# Patient Record
Sex: Male | Born: 1950 | Race: White | Hispanic: No | Marital: Married | Smoking: Former smoker
Health system: Southern US, Community
[De-identification: ages and names within clinical notes are randomized; demographics above are authoritative.]

## PROBLEM LIST (undated history)

## (undated) DIAGNOSIS — C61 Malignant neoplasm of prostate: Secondary | ICD-10-CM

## (undated) DIAGNOSIS — M1990 Unspecified osteoarthritis, unspecified site: Secondary | ICD-10-CM

## (undated) DIAGNOSIS — K5730 Diverticulosis of large intestine without perforation or abscess without bleeding: Secondary | ICD-10-CM

## (undated) DIAGNOSIS — F03918 Unspecified dementia, unspecified severity, with other behavioral disturbance: Secondary | ICD-10-CM

## (undated) DIAGNOSIS — K5790 Diverticulosis of intestine, part unspecified, without perforation or abscess without bleeding: Secondary | ICD-10-CM

## (undated) DIAGNOSIS — M87052 Idiopathic aseptic necrosis of left femur: Secondary | ICD-10-CM

## (undated) DIAGNOSIS — F0391 Unspecified dementia with behavioral disturbance: Secondary | ICD-10-CM

## (undated) DIAGNOSIS — E785 Hyperlipidemia, unspecified: Secondary | ICD-10-CM

## (undated) DIAGNOSIS — Z8739 Personal history of other diseases of the musculoskeletal system and connective tissue: Secondary | ICD-10-CM

## (undated) DIAGNOSIS — Z973 Presence of spectacles and contact lenses: Secondary | ICD-10-CM

## (undated) DIAGNOSIS — Z7901 Long term (current) use of anticoagulants: Secondary | ICD-10-CM

## (undated) DIAGNOSIS — F0390 Unspecified dementia without behavioral disturbance: Secondary | ICD-10-CM

## (undated) DIAGNOSIS — K222 Esophageal obstruction: Secondary | ICD-10-CM

## (undated) DIAGNOSIS — G8929 Other chronic pain: Secondary | ICD-10-CM

## (undated) DIAGNOSIS — IMO0002 Reserved for concepts with insufficient information to code with codable children: Secondary | ICD-10-CM

## (undated) DIAGNOSIS — I2510 Atherosclerotic heart disease of native coronary artery without angina pectoris: Secondary | ICD-10-CM

## (undated) DIAGNOSIS — G894 Chronic pain syndrome: Secondary | ICD-10-CM

## (undated) DIAGNOSIS — Z8619 Personal history of other infectious and parasitic diseases: Secondary | ICD-10-CM

## (undated) DIAGNOSIS — I10 Essential (primary) hypertension: Secondary | ICD-10-CM

## (undated) DIAGNOSIS — Z86718 Personal history of other venous thrombosis and embolism: Secondary | ICD-10-CM

## (undated) DIAGNOSIS — F1011 Alcohol abuse, in remission: Secondary | ICD-10-CM

## (undated) DIAGNOSIS — G319 Degenerative disease of nervous system, unspecified: Secondary | ICD-10-CM

## (undated) DIAGNOSIS — Z951 Presence of aortocoronary bypass graft: Secondary | ICD-10-CM

## (undated) DIAGNOSIS — R413 Other amnesia: Secondary | ICD-10-CM

## (undated) HISTORY — DX: Unspecified dementia without behavioral disturbance: F03.90

## (undated) HISTORY — DX: Idiopathic aseptic necrosis of left femur: M87.052

## (undated) HISTORY — DX: Reserved for concepts with insufficient information to code with codable children: IMO0002

---
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
## 1997-01-27 DIAGNOSIS — Z951 Presence of aortocoronary bypass graft: Secondary | ICD-10-CM

## 1997-01-27 HISTORY — DX: Presence of aortocoronary bypass graft: Z95.1

## 1997-06-29 HISTORY — PX: CORONARY ARTERY BYPASS GRAFT: SHX141

## 2008-04-03 ENCOUNTER — Ambulatory Visit (HOSPITAL_COMMUNITY): Admission: RE | Admit: 2008-04-03 | Discharge: 2008-04-03 | Payer: Self-pay | Admitting: Internal Medicine

## 2010-05-31 ENCOUNTER — Other Ambulatory Visit (HOSPITAL_COMMUNITY): Payer: Self-pay | Admitting: Internal Medicine

## 2010-05-31 DIAGNOSIS — R4182 Altered mental status, unspecified: Secondary | ICD-10-CM

## 2010-06-03 ENCOUNTER — Ambulatory Visit (HOSPITAL_COMMUNITY)
Admission: RE | Admit: 2010-06-03 | Discharge: 2010-06-03 | Disposition: A | Discharge status: Home / Self Care | Payer: Self-pay | Source: Ambulatory Visit | Attending: Internal Medicine | Admitting: Internal Medicine

## 2010-06-03 DIAGNOSIS — R4182 Altered mental status, unspecified: Secondary | ICD-10-CM

## 2011-06-09 DIAGNOSIS — F528 Other sexual dysfunction not due to a substance or known physiological condition: Secondary | ICD-10-CM | POA: Diagnosis not present

## 2011-06-09 DIAGNOSIS — Z6831 Body mass index (BMI) 31.0-31.9, adult: Secondary | ICD-10-CM | POA: Diagnosis not present

## 2011-06-09 DIAGNOSIS — F329 Major depressive disorder, single episode, unspecified: Secondary | ICD-10-CM | POA: Diagnosis not present

## 2011-06-09 DIAGNOSIS — G8929 Other chronic pain: Secondary | ICD-10-CM | POA: Diagnosis not present

## 2011-06-09 DIAGNOSIS — M159 Polyosteoarthritis, unspecified: Secondary | ICD-10-CM | POA: Diagnosis not present

## 2011-09-30 DIAGNOSIS — Z0000 Encounter for general adult medical examination without abnormal findings: Secondary | ICD-10-CM | POA: Diagnosis not present

## 2011-09-30 DIAGNOSIS — M159 Polyosteoarthritis, unspecified: Secondary | ICD-10-CM | POA: Diagnosis not present

## 2011-09-30 DIAGNOSIS — G8929 Other chronic pain: Secondary | ICD-10-CM | POA: Diagnosis not present

## 2011-11-03 ENCOUNTER — Telehealth: Payer: Self-pay

## 2011-11-03 NOTE — Telephone Encounter (Signed)
LMOM to call.

## 2011-11-04 ENCOUNTER — Telehealth: Payer: Self-pay | Admitting: *Deleted

## 2011-11-04 NOTE — Telephone Encounter (Signed)
Mr Kemler called back to schedule his colonoscopy with you. He is concerned about his insurance covering the bill as well. Please call him back. Thanks.

## 2011-11-04 NOTE — Telephone Encounter (Signed)
Pt's wife said that the insurance will have better coverage if they do the procedure in doctor's office as opposed to hospital facility. I told her to let Dr. Sherwood Gambler know, so he could send a different referral.

## 2011-11-04 NOTE — Telephone Encounter (Signed)
Faxed a note to Dr. Sherwood Gambler in reference to the referral.

## 2011-12-01 DIAGNOSIS — Z79899 Other long term (current) drug therapy: Secondary | ICD-10-CM | POA: Diagnosis not present

## 2011-12-01 DIAGNOSIS — R7309 Other abnormal glucose: Secondary | ICD-10-CM | POA: Diagnosis not present

## 2011-12-01 DIAGNOSIS — Z125 Encounter for screening for malignant neoplasm of prostate: Secondary | ICD-10-CM | POA: Diagnosis not present

## 2012-01-30 DIAGNOSIS — Z6830 Body mass index (BMI) 30.0-30.9, adult: Secondary | ICD-10-CM | POA: Diagnosis not present

## 2012-01-30 DIAGNOSIS — M159 Polyosteoarthritis, unspecified: Secondary | ICD-10-CM | POA: Diagnosis not present

## 2012-01-30 DIAGNOSIS — G8929 Other chronic pain: Secondary | ICD-10-CM | POA: Diagnosis not present

## 2012-01-30 DIAGNOSIS — G4700 Insomnia, unspecified: Secondary | ICD-10-CM | POA: Diagnosis not present

## 2012-01-30 DIAGNOSIS — F411 Generalized anxiety disorder: Secondary | ICD-10-CM | POA: Diagnosis not present

## 2012-04-29 DIAGNOSIS — G4700 Insomnia, unspecified: Secondary | ICD-10-CM | POA: Diagnosis not present

## 2012-04-29 DIAGNOSIS — I10 Essential (primary) hypertension: Secondary | ICD-10-CM | POA: Diagnosis not present

## 2012-04-29 DIAGNOSIS — G8929 Other chronic pain: Secondary | ICD-10-CM | POA: Diagnosis not present

## 2012-04-29 DIAGNOSIS — Z6830 Body mass index (BMI) 30.0-30.9, adult: Secondary | ICD-10-CM | POA: Diagnosis not present

## 2012-08-09 DIAGNOSIS — G8929 Other chronic pain: Secondary | ICD-10-CM | POA: Diagnosis not present

## 2012-08-09 DIAGNOSIS — I10 Essential (primary) hypertension: Secondary | ICD-10-CM | POA: Diagnosis not present

## 2012-08-09 DIAGNOSIS — Z6827 Body mass index (BMI) 27.0-27.9, adult: Secondary | ICD-10-CM | POA: Diagnosis not present

## 2012-08-09 DIAGNOSIS — M159 Polyosteoarthritis, unspecified: Secondary | ICD-10-CM | POA: Diagnosis not present

## 2012-08-11 ENCOUNTER — Encounter: Payer: Self-pay | Admitting: Internal Medicine

## 2012-10-29 DIAGNOSIS — Z79899 Other long term (current) drug therapy: Secondary | ICD-10-CM | POA: Diagnosis not present

## 2012-10-29 DIAGNOSIS — Z125 Encounter for screening for malignant neoplasm of prostate: Secondary | ICD-10-CM | POA: Diagnosis not present

## 2012-10-29 DIAGNOSIS — Z0000 Encounter for general adult medical examination without abnormal findings: Secondary | ICD-10-CM | POA: Diagnosis not present

## 2012-11-01 DIAGNOSIS — Z23 Encounter for immunization: Secondary | ICD-10-CM | POA: Diagnosis not present

## 2012-11-01 DIAGNOSIS — G8929 Other chronic pain: Secondary | ICD-10-CM | POA: Diagnosis not present

## 2012-11-01 DIAGNOSIS — Z681 Body mass index (BMI) 19 or less, adult: Secondary | ICD-10-CM | POA: Diagnosis not present

## 2013-01-28 DIAGNOSIS — F411 Generalized anxiety disorder: Secondary | ICD-10-CM | POA: Diagnosis not present

## 2013-01-28 DIAGNOSIS — G8929 Other chronic pain: Secondary | ICD-10-CM | POA: Diagnosis not present

## 2013-01-28 DIAGNOSIS — Z6827 Body mass index (BMI) 27.0-27.9, adult: Secondary | ICD-10-CM | POA: Diagnosis not present

## 2013-01-28 DIAGNOSIS — G4700 Insomnia, unspecified: Secondary | ICD-10-CM | POA: Diagnosis not present

## 2013-05-30 DIAGNOSIS — M1990 Unspecified osteoarthritis, unspecified site: Secondary | ICD-10-CM | POA: Diagnosis not present

## 2013-05-30 DIAGNOSIS — F411 Generalized anxiety disorder: Secondary | ICD-10-CM | POA: Diagnosis not present

## 2013-05-30 DIAGNOSIS — G8929 Other chronic pain: Secondary | ICD-10-CM | POA: Diagnosis not present

## 2013-05-30 DIAGNOSIS — Z6827 Body mass index (BMI) 27.0-27.9, adult: Secondary | ICD-10-CM | POA: Diagnosis not present

## 2013-09-05 DIAGNOSIS — G8929 Other chronic pain: Secondary | ICD-10-CM | POA: Diagnosis not present

## 2013-09-05 DIAGNOSIS — G4700 Insomnia, unspecified: Secondary | ICD-10-CM | POA: Diagnosis not present

## 2013-09-05 DIAGNOSIS — Z6829 Body mass index (BMI) 29.0-29.9, adult: Secondary | ICD-10-CM | POA: Diagnosis not present

## 2013-11-29 DIAGNOSIS — I10 Essential (primary) hypertension: Secondary | ICD-10-CM | POA: Diagnosis not present

## 2013-11-29 DIAGNOSIS — Z6827 Body mass index (BMI) 27.0-27.9, adult: Secondary | ICD-10-CM | POA: Diagnosis not present

## 2013-11-29 DIAGNOSIS — M1991 Primary osteoarthritis, unspecified site: Secondary | ICD-10-CM | POA: Diagnosis not present

## 2013-11-29 DIAGNOSIS — G894 Chronic pain syndrome: Secondary | ICD-10-CM | POA: Diagnosis not present

## 2014-01-26 DIAGNOSIS — F919 Conduct disorder, unspecified: Secondary | ICD-10-CM | POA: Diagnosis not present

## 2014-01-26 DIAGNOSIS — S8010XA Contusion of unspecified lower leg, initial encounter: Secondary | ICD-10-CM | POA: Diagnosis not present

## 2014-01-26 DIAGNOSIS — M25552 Pain in left hip: Secondary | ICD-10-CM | POA: Diagnosis not present

## 2014-01-26 DIAGNOSIS — F419 Anxiety disorder, unspecified: Secondary | ICD-10-CM | POA: Diagnosis not present

## 2014-01-26 DIAGNOSIS — S0512XA Contusion of eyeball and orbital tissues, left eye, initial encounter: Secondary | ICD-10-CM | POA: Diagnosis not present

## 2014-01-30 DIAGNOSIS — I10 Essential (primary) hypertension: Secondary | ICD-10-CM | POA: Diagnosis not present

## 2014-02-21 DIAGNOSIS — E6609 Other obesity due to excess calories: Secondary | ICD-10-CM | POA: Diagnosis not present

## 2014-02-21 DIAGNOSIS — Z6830 Body mass index (BMI) 30.0-30.9, adult: Secondary | ICD-10-CM | POA: Diagnosis not present

## 2014-02-21 DIAGNOSIS — G894 Chronic pain syndrome: Secondary | ICD-10-CM | POA: Diagnosis not present

## 2014-02-21 DIAGNOSIS — M1991 Primary osteoarthritis, unspecified site: Secondary | ICD-10-CM | POA: Diagnosis not present

## 2014-06-01 DIAGNOSIS — I10 Essential (primary) hypertension: Secondary | ICD-10-CM | POA: Diagnosis not present

## 2014-06-01 DIAGNOSIS — Z0000 Encounter for general adult medical examination without abnormal findings: Secondary | ICD-10-CM | POA: Diagnosis not present

## 2014-06-01 DIAGNOSIS — M1991 Primary osteoarthritis, unspecified site: Secondary | ICD-10-CM | POA: Diagnosis not present

## 2014-06-01 DIAGNOSIS — F419 Anxiety disorder, unspecified: Secondary | ICD-10-CM | POA: Diagnosis not present

## 2014-06-01 DIAGNOSIS — E6609 Other obesity due to excess calories: Secondary | ICD-10-CM | POA: Diagnosis not present

## 2014-06-01 DIAGNOSIS — G894 Chronic pain syndrome: Secondary | ICD-10-CM | POA: Diagnosis not present

## 2014-06-01 DIAGNOSIS — Z6831 Body mass index (BMI) 31.0-31.9, adult: Secondary | ICD-10-CM | POA: Diagnosis not present

## 2014-08-31 DIAGNOSIS — M461 Sacroiliitis, not elsewhere classified: Secondary | ICD-10-CM | POA: Diagnosis not present

## 2014-08-31 DIAGNOSIS — G894 Chronic pain syndrome: Secondary | ICD-10-CM | POA: Diagnosis not present

## 2014-08-31 DIAGNOSIS — Z1389 Encounter for screening for other disorder: Secondary | ICD-10-CM | POA: Diagnosis not present

## 2014-08-31 DIAGNOSIS — M1991 Primary osteoarthritis, unspecified site: Secondary | ICD-10-CM | POA: Diagnosis not present

## 2014-08-31 DIAGNOSIS — I10 Essential (primary) hypertension: Secondary | ICD-10-CM | POA: Diagnosis not present

## 2014-08-31 DIAGNOSIS — E6609 Other obesity due to excess calories: Secondary | ICD-10-CM | POA: Diagnosis not present

## 2014-08-31 DIAGNOSIS — Z6831 Body mass index (BMI) 31.0-31.9, adult: Secondary | ICD-10-CM | POA: Diagnosis not present

## 2015-01-04 DIAGNOSIS — G894 Chronic pain syndrome: Secondary | ICD-10-CM | POA: Diagnosis not present

## 2015-01-04 DIAGNOSIS — Z6830 Body mass index (BMI) 30.0-30.9, adult: Secondary | ICD-10-CM | POA: Diagnosis not present

## 2015-01-04 DIAGNOSIS — I10 Essential (primary) hypertension: Secondary | ICD-10-CM | POA: Diagnosis not present

## 2015-01-04 DIAGNOSIS — F419 Anxiety disorder, unspecified: Secondary | ICD-10-CM | POA: Diagnosis not present

## 2015-01-04 DIAGNOSIS — Z1389 Encounter for screening for other disorder: Secondary | ICD-10-CM | POA: Diagnosis not present

## 2015-01-04 DIAGNOSIS — M1991 Primary osteoarthritis, unspecified site: Secondary | ICD-10-CM | POA: Diagnosis not present

## 2015-03-30 DIAGNOSIS — Z6831 Body mass index (BMI) 31.0-31.9, adult: Secondary | ICD-10-CM | POA: Diagnosis not present

## 2015-03-30 DIAGNOSIS — M1991 Primary osteoarthritis, unspecified site: Secondary | ICD-10-CM | POA: Diagnosis not present

## 2015-03-30 DIAGNOSIS — G894 Chronic pain syndrome: Secondary | ICD-10-CM | POA: Diagnosis not present

## 2015-03-30 DIAGNOSIS — F419 Anxiety disorder, unspecified: Secondary | ICD-10-CM | POA: Diagnosis not present

## 2015-07-02 DIAGNOSIS — F329 Major depressive disorder, single episode, unspecified: Secondary | ICD-10-CM | POA: Diagnosis not present

## 2015-07-02 DIAGNOSIS — G629 Polyneuropathy, unspecified: Secondary | ICD-10-CM | POA: Diagnosis not present

## 2015-07-02 DIAGNOSIS — G894 Chronic pain syndrome: Secondary | ICD-10-CM | POA: Diagnosis not present

## 2015-07-02 DIAGNOSIS — Z125 Encounter for screening for malignant neoplasm of prostate: Secondary | ICD-10-CM | POA: Diagnosis not present

## 2015-07-02 DIAGNOSIS — E781 Pure hyperglyceridemia: Secondary | ICD-10-CM | POA: Diagnosis not present

## 2015-07-02 DIAGNOSIS — E6609 Other obesity due to excess calories: Secondary | ICD-10-CM | POA: Diagnosis not present

## 2015-07-02 DIAGNOSIS — R5383 Other fatigue: Secondary | ICD-10-CM | POA: Diagnosis not present

## 2015-07-02 DIAGNOSIS — F419 Anxiety disorder, unspecified: Secondary | ICD-10-CM | POA: Diagnosis not present

## 2015-07-02 DIAGNOSIS — Z1389 Encounter for screening for other disorder: Secondary | ICD-10-CM | POA: Diagnosis not present

## 2015-07-02 DIAGNOSIS — Z0001 Encounter for general adult medical examination with abnormal findings: Secondary | ICD-10-CM | POA: Diagnosis not present

## 2015-07-02 DIAGNOSIS — D519 Vitamin B12 deficiency anemia, unspecified: Secondary | ICD-10-CM | POA: Diagnosis not present

## 2015-07-02 DIAGNOSIS — M1991 Primary osteoarthritis, unspecified site: Secondary | ICD-10-CM | POA: Diagnosis not present

## 2015-07-02 DIAGNOSIS — Z6830 Body mass index (BMI) 30.0-30.9, adult: Secondary | ICD-10-CM | POA: Diagnosis not present

## 2015-07-02 DIAGNOSIS — I10 Essential (primary) hypertension: Secondary | ICD-10-CM | POA: Diagnosis not present

## 2015-07-02 DIAGNOSIS — R413 Other amnesia: Secondary | ICD-10-CM | POA: Diagnosis not present

## 2015-09-05 ENCOUNTER — Encounter (HOSPITAL_COMMUNITY): Payer: Self-pay

## 2015-09-05 ENCOUNTER — Inpatient Hospital Stay (HOSPITAL_COMMUNITY)
Admission: EM | Admit: 2015-09-05 | Discharge: 2015-09-10 | DRG: 442 | Disposition: A | Discharge status: Home / Self Care | Payer: Medicare Other | Attending: Internal Medicine | Admitting: Internal Medicine

## 2015-09-05 ENCOUNTER — Emergency Department (HOSPITAL_COMMUNITY): Payer: Medicare Other

## 2015-09-05 DIAGNOSIS — R188 Other ascites: Secondary | ICD-10-CM | POA: Diagnosis present

## 2015-09-05 DIAGNOSIS — K7460 Unspecified cirrhosis of liver: Secondary | ICD-10-CM | POA: Diagnosis present

## 2015-09-05 DIAGNOSIS — M879 Osteonecrosis, unspecified: Secondary | ICD-10-CM | POA: Diagnosis present

## 2015-09-05 DIAGNOSIS — Z888 Allergy status to other drugs, medicaments and biological substances status: Secondary | ICD-10-CM | POA: Diagnosis not present

## 2015-09-05 DIAGNOSIS — IMO0002 Reserved for concepts with insufficient information to code with codable children: Secondary | ICD-10-CM

## 2015-09-05 DIAGNOSIS — Z951 Presence of aortocoronary bypass graft: Secondary | ICD-10-CM | POA: Diagnosis not present

## 2015-09-05 DIAGNOSIS — R1084 Generalized abdominal pain: Secondary | ICD-10-CM

## 2015-09-05 DIAGNOSIS — I10 Essential (primary) hypertension: Secondary | ICD-10-CM | POA: Diagnosis present

## 2015-09-05 DIAGNOSIS — I2510 Atherosclerotic heart disease of native coronary artery without angina pectoris: Secondary | ICD-10-CM | POA: Diagnosis present

## 2015-09-05 DIAGNOSIS — E1165 Type 2 diabetes mellitus with hyperglycemia: Secondary | ICD-10-CM | POA: Diagnosis present

## 2015-09-05 DIAGNOSIS — I81 Portal vein thrombosis: Principal | ICD-10-CM | POA: Diagnosis present

## 2015-09-05 DIAGNOSIS — R1010 Upper abdominal pain, unspecified: Secondary | ICD-10-CM

## 2015-09-05 DIAGNOSIS — Z79899 Other long term (current) drug therapy: Secondary | ICD-10-CM | POA: Diagnosis not present

## 2015-09-05 DIAGNOSIS — Z7982 Long term (current) use of aspirin: Secondary | ICD-10-CM | POA: Diagnosis not present

## 2015-09-05 DIAGNOSIS — R509 Fever, unspecified: Secondary | ICD-10-CM

## 2015-09-05 DIAGNOSIS — R1033 Periumbilical pain: Secondary | ICD-10-CM | POA: Diagnosis not present

## 2015-09-05 DIAGNOSIS — N3000 Acute cystitis without hematuria: Secondary | ICD-10-CM | POA: Diagnosis not present

## 2015-09-05 DIAGNOSIS — K55069 Acute infarction of intestine, part and extent unspecified: Secondary | ICD-10-CM | POA: Diagnosis present

## 2015-09-05 HISTORY — DX: Essential (primary) hypertension: I10

## 2015-09-05 LAB — COMPREHENSIVE METABOLIC PANEL
ALBUMIN: 3.1 g/dL — AB (ref 3.5–5.0)
ALK PHOS: 127 U/L — AB (ref 38–126)
ALT: 54 U/L (ref 17–63)
AST: 28 U/L (ref 15–41)
Anion gap: 5 (ref 5–15)
BILIRUBIN TOTAL: 1.4 mg/dL — AB (ref 0.3–1.2)
BUN: 11 mg/dL (ref 6–20)
CALCIUM: 8.1 mg/dL — AB (ref 8.9–10.3)
CO2: 27 mmol/L (ref 22–32)
CREATININE: 0.85 mg/dL (ref 0.61–1.24)
Chloride: 101 mmol/L (ref 101–111)
GFR calc Af Amer: >60 mL/min (ref >60)
GFR calc non Af Amer: >60 mL/min (ref >60)
GLUCOSE: 140 mg/dL — AB (ref 65–99)
Potassium: 3.9 mmol/L (ref 3.5–5.1)
SODIUM: 133 mmol/L — AB (ref 135–145)
TOTAL PROTEIN: 7.2 g/dL (ref 6.5–8.1)

## 2015-09-05 LAB — CBC
HCT: 41.5 % (ref 39.0–52.0)
HEMOGLOBIN: 14 g/dL (ref 13.0–17.0)
MCH: 31.8 pg (ref 26.0–34.0)
MCHC: 33.7 g/dL (ref 30.0–36.0)
MCV: 94.3 fL (ref 78.0–100.0)
PLATELETS: 233 10*3/uL (ref 150–400)
RBC: 4.4 MIL/uL (ref 4.22–5.81)
RDW: 13.3 % (ref 11.5–15.5)
WBC: 14.1 10*3/uL — ABNORMAL HIGH (ref 4.0–10.5)

## 2015-09-05 LAB — URINE MICROSCOPIC-ADD ON: RBC / HPF: NONE SEEN RBC/hpf (ref 0–5)

## 2015-09-05 LAB — URINALYSIS, ROUTINE W REFLEX MICROSCOPIC
Bilirubin Urine: NEGATIVE
Glucose, UA: NEGATIVE mg/dL
Hgb urine dipstick: NEGATIVE
Leukocytes, UA: NEGATIVE
Nitrite: POSITIVE — AB
Protein, ur: NEGATIVE mg/dL
SPECIFIC GRAVITY, URINE: 1.01 (ref 1.005–1.030)
pH: 5.5 (ref 5.0–8.0)

## 2015-09-05 LAB — MAGNESIUM: Magnesium: 1.9 mg/dL (ref 1.7–2.4)

## 2015-09-05 LAB — LIPASE, BLOOD: Lipase: 24 U/L (ref 11–51)

## 2015-09-05 LAB — PROTIME-INR
INR: 1.02
PROTHROMBIN TIME: 13.4 s (ref 11.4–15.2)

## 2015-09-05 LAB — APTT: APTT: 30 s (ref 24–36)

## 2015-09-05 LAB — PHOSPHORUS: Phosphorus: 3.4 mg/dL (ref 2.5–4.6)

## 2015-09-05 MED ORDER — HYDROCODONE-ACETAMINOPHEN 10-325 MG PO TABS
1.0000 | ORAL_TABLET | Freq: Four times a day (QID) | ORAL | Status: DC | PRN
  Administered 2015-09-06 – 2015-09-09 (×7): 1 via ORAL
  Filled 2015-09-05 (×9): qty 1

## 2015-09-05 MED ORDER — ATENOLOL 25 MG PO TABS
25.0000 mg | ORAL_TABLET | Freq: Every day | ORAL | Status: DC
  Administered 2015-09-05 – 2015-09-09 (×4): 25 mg via ORAL
  Filled 2015-09-05 (×5): qty 1

## 2015-09-05 MED ORDER — ZOLPIDEM TARTRATE 5 MG PO TABS
5.0000 mg | ORAL_TABLET | Freq: Every evening | ORAL | Status: DC | PRN
  Administered 2015-09-05 – 2015-09-07 (×2): 5 mg via ORAL
  Filled 2015-09-05 (×2): qty 1

## 2015-09-05 MED ORDER — ALPRAZOLAM 0.5 MG PO TABS
1.0000 mg | ORAL_TABLET | Freq: Three times a day (TID) | ORAL | Status: DC
  Administered 2015-09-06 (×2): 1 mg via ORAL
  Filled 2015-09-05 (×3): qty 2

## 2015-09-05 MED ORDER — SODIUM CHLORIDE 0.9 % IV BOLUS (SEPSIS)
1000.0000 mL | Freq: Once | INTRAVENOUS | Status: AC
  Administered 2015-09-05: 1000 mL via INTRAVENOUS

## 2015-09-05 MED ORDER — HYDROMORPHONE HCL 1 MG/ML IJ SOLN
1.0000 mg | Freq: Once | INTRAMUSCULAR | Status: AC
  Administered 2015-09-05: 1 mg via INTRAVENOUS
  Filled 2015-09-05: qty 1

## 2015-09-05 MED ORDER — IOPAMIDOL (ISOVUE-300) INJECTION 61%
100.0000 mL | Freq: Once | INTRAVENOUS | Status: AC | PRN
  Administered 2015-09-05: 100 mL via INTRAVENOUS

## 2015-09-05 MED ORDER — GABAPENTIN 300 MG PO CAPS
300.0000 mg | ORAL_CAPSULE | Freq: Three times a day (TID) | ORAL | Status: DC
  Administered 2015-09-06 – 2015-09-10 (×13): 300 mg via ORAL
  Filled 2015-09-05 (×14): qty 1

## 2015-09-05 MED ORDER — BUPROPION HCL 100 MG PO TABS
100.0000 mg | ORAL_TABLET | Freq: Two times a day (BID) | ORAL | Status: DC
  Administered 2015-09-06 – 2015-09-10 (×9): 100 mg via ORAL
  Filled 2015-09-05 (×10): qty 1

## 2015-09-05 MED ORDER — ATORVASTATIN CALCIUM 20 MG PO TABS
20.0000 mg | ORAL_TABLET | Freq: Every day | ORAL | Status: DC
  Administered 2015-09-06 – 2015-09-09 (×4): 20 mg via ORAL
  Filled 2015-09-05 (×4): qty 1

## 2015-09-05 MED ORDER — HEPARIN (PORCINE) IN NACL 100-0.45 UNIT/ML-% IJ SOLN
1700.0000 [IU]/h | INTRAMUSCULAR | Status: DC
  Administered 2015-09-05: 1300 [IU]/h via INTRAVENOUS
  Administered 2015-09-07 (×2): 1550 [IU]/h via INTRAVENOUS
  Administered 2015-09-09 – 2015-09-10 (×2): 1700 [IU]/h via INTRAVENOUS
  Filled 2015-09-05 (×8): qty 250

## 2015-09-05 MED ORDER — ONDANSETRON HCL 4 MG/2ML IJ SOLN
4.0000 mg | Freq: Once | INTRAMUSCULAR | Status: AC
  Administered 2015-09-05: 4 mg via INTRAVENOUS
  Filled 2015-09-05: qty 2

## 2015-09-05 MED ORDER — MORPHINE SULFATE (PF) 4 MG/ML IV SOLN
4.0000 mg | Freq: Once | INTRAVENOUS | Status: AC
  Administered 2015-09-05: 4 mg via INTRAVENOUS
  Filled 2015-09-05: qty 1

## 2015-09-05 MED ORDER — HEPARIN BOLUS VIA INFUSION
5000.0000 [IU] | Freq: Once | INTRAVENOUS | Status: AC
  Administered 2015-09-05: 5000 [IU] via INTRAVENOUS

## 2015-09-05 MED ORDER — SODIUM CHLORIDE 0.9 % IV SOLN
INTRAVENOUS | Status: AC
  Administered 2015-09-06: 06:00:00 via INTRAVENOUS

## 2015-09-05 MED ORDER — ASPIRIN EC 81 MG PO TBEC
81.0000 mg | DELAYED_RELEASE_TABLET | Freq: Every day | ORAL | Status: DC
  Administered 2015-09-06 – 2015-09-10 (×5): 81 mg via ORAL
  Filled 2015-09-05 (×5): qty 1

## 2015-09-05 NOTE — ED Provider Notes (Signed)
[facility] DEPT Provider Note   CSN: [id number] Arrival date & time: 09/05/15  1530  First Provider Contact:  First MD Initiated Contact with Patient 09/05/15 1539        History   Chief Complaint Chief Complaint  Patient presents with  . Abdominal Pain    HPI ALAMIN SPRUNK is a 65 y.o. male.  Pt has been sent from his pcp's office because of abdominal pain.  He said that it's been going on for 1 week.  The pain is everywhere, but no other associated sx.      Past Medical History:  Diagnosis Date  . Hypertension     Patient Active Problem List   Diagnosis Date Noted  . 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 CARDIAC SURGERY         Home Medications    Prior to Admission medications   Medication Sig Start Date End Date Taking? Authorizing Provider  alprazolam Duanne Moron) 2 MG tablet Take 1 tablet by mouth 4 (four) times daily. 08/07/15  Yes Historical Provider, MD  aspirin EC 81 MG tablet Take 81 mg by mouth daily.   Yes Historical Provider, MD  atenolol (TENORMIN) 25 MG tablet Take 25 mg by mouth at bedtime.   Yes Historical Provider, MD  atorvastatin (LIPITOR) 20 MG tablet Take 1 tablet by mouth daily. 08/07/15  Yes Historical Provider, MD  buPROPion (WELLBUTRIN) 100 MG tablet Take 1 tablet by mouth 2 (two) times daily. 06/09/15  Yes Historical Provider, MD  gabapentin (NEURONTIN) 300 MG capsule Take 1 capsule by mouth 3 (three) times daily. 08/27/15  Yes Historical Provider, MD  HYDROcodone-acetaminophen (NORCO) 10-325 MG tablet Take 1 tablet by mouth 4 (four) times daily as needed for moderate pain.  08/27/15  Yes Historical Provider, MD  traMADol (ULTRAM) 50 MG tablet Take 1 tablet by mouth 4 (four) times daily as needed for moderate pain.  08/07/15  Yes Historical Provider, MD  zolpidem (AMBIEN) 5 MG tablet Take 5 mg by mouth at bedtime as needed for sleep.   Yes Historical Provider, MD     Family History No family history on file.  Social History Social History  Substance Use Topics  . Smoking status: Never Smoker  . Smokeless tobacco: Never Used  . Alcohol use No     Allergies   Lopressor [metoprolol tartrate]   Review of Systems Review of Systems  Gastrointestinal: Positive for abdominal pain.  All other systems reviewed and are negative.    Physical Exam Updated Vital Signs BP 118/71   Pulse 92   Temp 98 F (36.7 C) (Oral)   Resp 18   Ht 5\' 8"  (1.727 m)   Wt 183 lb (83 kg)   SpO2 99%   BMI 27.83 kg/m   Physical Exam  Constitutional: He is oriented to person, place, and time. He appears well-developed and well-nourished.  HENT:  Head: Normocephalic and atraumatic.  Right Ear: External ear normal.  Left Ear: External ear normal.  Nose: Nose normal.  Mouth/Throat: Oropharynx is clear and moist.  Eyes: Conjunctivae and EOM are normal. Pupils are equal, round, and reactive to light.  Neck: Normal range of motion. Neck supple.  Cardiovascular: Normal rate, regular rhythm, normal heart sounds and intact distal pulses.   Pulmonary/Chest: Effort normal and breath sounds normal.  Abdominal: Soft. There is generalized tenderness.  Musculoskeletal: Normal range of motion.  Neurological: He is alert and oriented  to person, place, and time.  Skin: Skin is warm and dry.  Psychiatric: He has a normal mood and affect. His behavior is normal. Judgment and thought content normal.  Nursing note and vitals reviewed.    ED Treatments / Results  Labs (all labs ordered are listed, but only abnormal results are displayed) Labs Reviewed  COMPREHENSIVE METABOLIC PANEL - Abnormal; Notable for the following:       Result Value   Sodium 133 (*)    Glucose, Bld 140 (*)    Calcium 8.1 (*)    Albumin 3.1 (*)    Alkaline Phosphatase 127 (*)    Total Bilirubin 1.4 (*)    All other components within normal limits  CBC - Abnormal; Notable for the following:     WBC 14.1 (*)    All other components within normal limits  URINALYSIS, ROUTINE W REFLEX MICROSCOPIC (NOT AT [facility]) - Abnormal; Notable for the following:    Ketones, ur TRACE (*)    Nitrite POSITIVE (*)    All other components within normal limits  URINE MICROSCOPIC-ADD ON - Abnormal; Notable for the following:    Squamous Epithelial / LPF 0-5 (*)    Bacteria, UA RARE (*)    All other components within normal limits  LIPASE, BLOOD  PROTIME-INR  APTT  HEPARIN LEVEL (UNFRACTIONATED)  CBC    EKG  EKG Interpretation None       Radiology Ct Abdomen Pelvis W Contrast  Result Date: 09/05/2015 CLINICAL DATA:  No oral contrast per MD order; upper abdominal pain x 1 week with nausea; no other complaints^152mL ISOVUE-300 IOPAMIDOL (ISOVUE-300) INJECTION 61% EXAM: CT ABDOMEN AND PELVIS WITH CONTRAST TECHNIQUE: Multidetector CT imaging of the abdomen and pelvis was performed using the standard protocol following bolus administration of intravenous contrast. CONTRAST:  170mL ISOVUE-300 IOPAMIDOL (ISOVUE-300) INJECTION 61% COMPARISON:  None. FINDINGS: Lower chest: Mild bibasilar atelectasis. Status post median sternotomy. Coronary artery calcification and changes of CABG. Heart is normal in size. Hepatobiliary: The liver is homogeneous. Rounded margins of the liver, prominent caudate lobe raise the question of cirrhosis. Gallbladder is normal in CT appearance. Pancreas: Pancreas is normal in appearance. Spleen: Normal in appearance. Renal/Adrenal: Adrenal glands are normal in appearance. Kidneys show symmetric bilateral enhancement. No hydronephrosis or renal mass. Gastrointestinal tract: The stomach has a normal appearance. There is significant stranding of the small bowel mesentery. Bowel wall appears normal with no evidence for obstruction. Colonic loops are notable for significant diverticulosis but no acute inflammatory changes. The appendix is well seen and has a normal appearance.  Reproductive/Pelvis: The urinary bladder, prostate, and seminal vesicles are normal in appearance. Small amount of free pelvic fluid is present. Vascular/Lymphatic: There is acute thrombus of the superior mesenteric vein, associated significant stranding of the mesentery. The portal vein appears patent. The superior mesenteric artery, celiac axis, and inferior mesenteric artery are normally opacified. There is atherosclerotic calcification of the abdominal aorta not associated with aneurysm or dissection. Musculoskeletal/Abdominal wall: Small supraumbilical fat containing midline hernia. Visualized osseous structures have a normal appearance. There is significant collapse and sclerosis of the left femoral head consistent with avascular necrosis. Other: none IMPRESSION: 1. Acute superior mesenteric vein thrombosis with associated ascites and mesenteric stranding. 2. Question of cirrhosis. 3. No focal liver lesions. 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 5. Small fat containing anterior abdominal wall hernia. Critical Value/emergent results were called by telephone at the time of interpretation on 09/05/2015 at 6:15 pm to Dr. Almyra Free  Albi Rappaport , who verbally acknowledged these results. 6. Changes following CABG.  Confirm Electronically Signed   By: Nolon Nations M.D.   On: 09/05/2015 18:20    Procedures Procedures (including critical care time)  Medications Ordered in ED Medications  heparin ADULT infusion 100 units/mL (25000 units/238mL sodium chloride 0.45%) (not administered)  heparin bolus via infusion 5,000 Units (not administered)  sodium chloride 0.9 % bolus 1,000 mL (0 mLs Intravenous Stopped 09/05/15 1901)  morphine 4 MG/ML injection 4 mg (4 mg Intravenous Given 09/05/15 1558)  ondansetron (ZOFRAN) injection 4 mg (4 mg Intravenous Given 09/05/15 1558)  iopamidol (ISOVUE-300) 61 % injection 100 mL (100 mLs Intravenous Contrast Given 09/05/15 1748)  morphine 4 MG/ML injection 4 mg (4 mg Intravenous  Given 09/05/15 1837)  ondansetron (ZOFRAN) injection 4 mg (4 mg Intravenous Given 09/05/15 1841)     Initial Impression / Assessment and Plan / ED Course  I have reviewed the triage vital signs and the nursing notes.  Pertinent labs & imaging results that were available during my care of the patient were reviewed by me and considered in my medical decision making (see chart for details).  Clinical Course   Pain has improved.  Pt d/w Dr. Trula Slade (vascular) who said no intervention by vascular surgery needed, but pt will need anticoagulation and he recommended heparin.  He said pt may need IR and may need a general surgery consult and suggested pt go to [facility] in case he needs IR.  Pt d/w Dr. Marthenia Rolling who will admit pt.  We will request a bed at [facility].  Final Clinical Impressions(s) / ED Diagnoses   Final diagnoses:  Superior mesenteric vein thrombosis (HCC)  Generalized abdominal pain    New Prescriptions New Prescriptions   No medications on file     Isla Pence, MD 09/05/15 [phone]

## 2015-09-05 NOTE — Progress Notes (Signed)
ANTICOAGULATION CONSULT NOTE - Initial Consult  Pharmacy Consult for HEPARIN Indication: VTE treatment  Allergies  Allergen Reactions  . Lopressor [Metoprolol Tartrate] Hives    Patient Measurements: Height: 5\' 8"  (172.7 cm) Weight: 183 lb (83 kg) IBW/kg (Calculated) : 68.4 HEPARIN DW (KG): 83  Vital Signs: Temp: 98 F (36.7 C) (08/09 1532) Temp Source: Oral (08/09 1532) BP: 118/67 (08/09 1730) Pulse Rate: 88 (08/09 1730)  Labs:  Recent Labs  09/05/15 1540  HGB 14.0  HCT 41.5  PLT 233  CREATININE 0.85    Estimated Creatinine Clearance: 92.1 mL/min (by C-G formula based on SCr of 0.85 mg/dL).   Medical History: Past Medical History:  Diagnosis Date  . Hypertension     Medications:  Scheduled:  . heparin  5,000 Units Intravenous Once    Assessment: 65yo male presented to ED with c/o abdominal pain.  Asked to initiate Heparin for VTE treatment.  CBC OK. Baseline labs pending.   Goal of Therapy:  Heparin level 0.3-0.7 units/ml Monitor platelets by anticoagulation protocol: Yes   Plan:   Heparin 5000 units IV now x 1  Heparin infusion at 1300 units/hr  Heparin level in 6-8 hrs then daily  CBC daily while on Heparin   [place] Crane, Abygayle Deltoro A 09/05/2015,7:00 PM

## 2015-09-05 NOTE — ED Triage Notes (Signed)
Pt here from PCP's office for evaluation of abdominal pain

## 2015-09-05 NOTE — H&P (Signed)
History and Physical  Larry Mullins [id number] DOB: 1950/06/17 DOA: 09/05/2015  Referring physician: ER Physician PCP: Glo Herring., MD  Outpatient Specialists:    Patient coming from: Home   Chief Complaint: Abdominal pain for about 1 week.  HPI: 65 year old male with history of Hypertension, reformed alcoholic and recently informed that he was diabetic. Patient presents with 1 week history of abdominal pain, worse with food, drinks or medication. No nausea or vomiting and no diarrhea. 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 No fever or chills, no headache, no neck pain, no urinary symptoms.  ED Course: Hydration. Transfer to [facility] hospital Pertinent labs: As above EKG: Independently reviewed.   Review of Systems:  As in HPI. Negative for fever, visual changes, sore throat, rash, new muscle aches, chest pain, SOB, dysuria, bleeding, n/v.  Past Medical History:  Diagnosis Date  . Hypertension     Past Surgical History:  Procedure Laterality Date  . CARDIAC SURGERY       reports that he has never smoked. He has never used smokeless tobacco. He reports that he does not drink alcohol or use drugs.  Allergies  Allergen Reactions  . Lopressor [Metoprolol Tartrate] Hives    No family history on file.   Prior to Admission medications   Medication Sig Start Date End Date Taking? Authorizing Provider  alprazolam Duanne Moron) 2 MG tablet Take 1 tablet by mouth 4 (four) times daily. 08/07/15  Yes Historical Provider, MD  aspirin EC 81 MG tablet Take 81 mg by mouth daily.   Yes Historical Provider, MD  atenolol (TENORMIN) 25 MG tablet Take 25 mg by mouth at bedtime.   Yes Historical Provider, MD  atorvastatin (LIPITOR) 20 MG tablet Take 1 tablet by mouth daily. 08/07/15  Yes Historical Provider, MD  buPROPion  (WELLBUTRIN) 100 MG tablet Take 1 tablet by mouth 2 (two) times daily. 06/09/15  Yes Historical Provider, MD  gabapentin (NEURONTIN) 300 MG capsule Take 1 capsule by mouth 3 (three) times daily. 08/27/15  Yes Historical Provider, MD  HYDROcodone-acetaminophen (NORCO) 10-325 MG tablet Take 1 tablet by mouth 4 (four) times daily as needed for moderate pain.  08/27/15  Yes Historical Provider, MD  traMADol (ULTRAM) 50 MG tablet Take 1 tablet by mouth 4 (four) times daily as needed for moderate pain.  08/07/15  Yes Historical Provider, MD  zolpidem (AMBIEN) 5 MG tablet Take 5 mg by mouth at bedtime as needed for sleep.   Yes Historical Provider, MD    Physical Exam: Vitals:   09/05/15 1630 09/05/15 1700 09/05/15 1730 09/05/15 1901  BP: 119/71 111/68 118/67 118/71  Pulse: 89  88 92  Resp:    18  Temp:      TempSrc:      SpO2: 92%  96% 99%  Weight:      Height:       Constitutional:  . Appears calm and comfortable Eyes:  . No pallor. No jaundice.  ENMT:  . external ears, nose appear normal Neck:  . Neck is supple. No JVD Respiratory:  . CTA bilaterally, no w/r/r.  . Respiratory effort normal. No retractions or accessory muscle use Cardiovascular:  . S1S2 . No LE extremity edema   Abdomen:  . Abdomen is tender on palpation. Organs are difficult to assess. Neurologic:  . Awake and alert. . Moves all limbs.  Wt  Readings from Last 3 Encounters:  09/05/15 83 kg (183 lb)    I have personally reviewed following labs and imaging studies  Labs on Admission:  CBC:  Recent Labs Lab 09/05/15 1540  WBC 14.1*  HGB 14.0  HCT 41.5  MCV 94.3  PLT 0000000   Basic Metabolic Panel:  Recent Labs Lab 09/05/15 1540  NA 133*  K 3.9  CL 101  CO2 27  GLUCOSE 140*  BUN 11  CREATININE 0.85  CALCIUM 8.1*   Liver Function Tests:  Recent Labs Lab 09/05/15 1540  AST 28  ALT 54  ALKPHOS 127*  BILITOT 1.4*  PROT 7.2  ALBUMIN 3.1*    Recent Labs Lab 09/05/15 1540  LIPASE 24    No results for input(s): AMMONIA in the last 168 hours. Coagulation Profile:  Recent Labs Lab 09/05/15 1540  INR 1.02   Cardiac Enzymes: No results for input(s): CKTOTAL, CKMB, CKMBINDEX, TROPONINI in the last 168 hours. BNP (last 3 results) No results for input(s): PROBNP in the last 8760 hours. HbA1C: No results for input(s): HGBA1C in the last 72 hours. CBG: No results for input(s): GLUCAP in the last 168 hours. Lipid Profile: No results for input(s): CHOL, HDL, LDLCALC, TRIG, CHOLHDL, LDLDIRECT in the last 72 hours. Thyroid Function Tests: No results for input(s): TSH, T4TOTAL, FREET4, T3FREE, THYROIDAB in the last 72 hours. Anemia Panel: No results for input(s): VITAMINB12, FOLATE, FERRITIN, TIBC, IRON, RETICCTPCT in the last 72 hours. Urine analysis:    Component Value Date/Time   COLORURINE YELLOW 09/05/2015 1530   APPEARANCEUR CLEAR 09/05/2015 1530   LABSPEC 1.010 09/05/2015 1530   PHURINE 5.5 09/05/2015 1530   GLUCOSEU NEGATIVE 09/05/2015 1530   HGBUR NEGATIVE 09/05/2015 1530   BILIRUBINUR NEGATIVE 09/05/2015 1530   KETONESUR TRACE (A) 09/05/2015 1530   PROTEINUR NEGATIVE 09/05/2015 1530   NITRITE POSITIVE (A) 09/05/2015 1530   LEUKOCYTESUR NEGATIVE 09/05/2015 1530   Sepsis Labs: @LABRCNTIP (procalcitonin:4,lacticidven:4) )No results found for this or any previous visit (from the past 240 hour(s)).    Radiological Exams on Admission: Ct Abdomen Pelvis W Contrast  Result Date: 09/05/2015 CLINICAL DATA:  No oral contrast per MD order; upper abdominal pain x 1 week with nausea; no other complaints^188mL ISOVUE-300 IOPAMIDOL (ISOVUE-300) INJECTION 61% EXAM: CT ABDOMEN AND PELVIS WITH CONTRAST TECHNIQUE: Multidetector CT imaging of the abdomen and pelvis was performed using the standard protocol following bolus administration of intravenous contrast. CONTRAST:  171mL ISOVUE-300 IOPAMIDOL (ISOVUE-300) INJECTION 61% COMPARISON:  None. FINDINGS: Lower chest: Mild  bibasilar atelectasis. Status post median sternotomy. Coronary artery calcification and changes of CABG. Heart is normal in size. Hepatobiliary: The liver is homogeneous. Rounded margins of the liver, prominent caudate lobe raise the question of cirrhosis. Gallbladder is normal in CT appearance. Pancreas: Pancreas is normal in appearance. Spleen: Normal in appearance. Renal/Adrenal: Adrenal glands are normal in appearance. Kidneys show symmetric bilateral enhancement. No hydronephrosis or renal mass. Gastrointestinal tract: The stomach has a normal appearance. There is significant stranding of the small bowel mesentery. Bowel wall appears normal with no evidence for obstruction. Colonic loops are notable for significant diverticulosis but no acute inflammatory changes. The appendix is well seen and has a normal appearance. Reproductive/Pelvis: The urinary bladder, prostate, and seminal vesicles are normal in appearance. Small amount of free pelvic fluid is present. Vascular/Lymphatic: There is acute thrombus of the superior mesenteric vein, associated significant stranding of the mesentery. The portal vein appears patent. The superior mesenteric artery, celiac axis, and inferior mesenteric  artery are normally opacified. There is atherosclerotic calcification of the abdominal aorta not associated with aneurysm or dissection. Musculoskeletal/Abdominal wall: Small supraumbilical fat containing midline hernia. Visualized osseous structures have a normal appearance. There is significant collapse and sclerosis of the left femoral head consistent with avascular necrosis. Other: none IMPRESSION: 1. Acute superior mesenteric vein thrombosis with associated ascites and mesenteric stranding. 2. Question of cirrhosis. 3. No focal liver lesions. 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 5. Small fat containing anterior abdominal wall hernia. Critical Value/emergent results were called by telephone at the time of  interpretation on 09/05/2015 at 6:15 pm to Dr. Isla Pence , who verbally acknowledged these results. 6. Changes following CABG.  Confirm Electronically Signed   By: Nolon Nations M.D.   On: 09/05/2015 18:20   Active Problems:   Superior mesenteric vein thrombosis (HCC)   Assessment/Plan 1. Superior mesenteric vein thrombosis 2. Abdominal pain likely secondary to SMV thrombosis 3. Elevated Blood sugar/DM 4. Hypertension   Admit patient to [facility]  Telemetry monitoring  Heparin drip  Please consult Vascular Surgery and GI to assist with patient's care  Optimize blood sugar and Blood pressure  Check Lactic acidosis  DVT prophylaxis: On full dose heparin IV Code Status: Full Family Communication: Wife Disposition Plan: Undetermined   Consults called: Please call GI and Vascular Surgery   Admission status: Inpatient    Time spent: Greater than 60 minutes  Dana Allan, MD  [facility] Pager #: [phone] 7PM-7AM contact night coverage as above   09/05/2015, 8:10 PM

## 2015-09-05 NOTE — ED Notes (Signed)
Pt requesting pain medication per Dr Kenn File repeat prior medications

## 2015-09-06 ENCOUNTER — Inpatient Hospital Stay (HOSPITAL_COMMUNITY): Payer: Medicare Other

## 2015-09-06 LAB — BASIC METABOLIC PANEL
Anion gap: 8 (ref 5–15)
BUN: 8 mg/dL (ref 6–20)
CO2: 29 mmol/L (ref 22–32)
Calcium: 8.3 mg/dL — ABNORMAL LOW (ref 8.9–10.3)
Chloride: 101 mmol/L (ref 101–111)
Creatinine, Ser: 1.01 mg/dL (ref 0.61–1.24)
GFR calc Af Amer: >60 mL/min (ref >60)
GFR calc non Af Amer: >60 mL/min (ref >60)
Glucose, Bld: 146 mg/dL — ABNORMAL HIGH (ref 65–99)
Potassium: 4.6 mmol/L (ref 3.5–5.1)
Sodium: 138 mmol/L (ref 135–145)

## 2015-09-06 LAB — CBC
HCT: 38.6 % — ABNORMAL LOW (ref 39.0–52.0)
HEMATOCRIT: 34.9 % — AB (ref 39.0–52.0)
HEMOGLOBIN: 11.2 g/dL — AB (ref 13.0–17.0)
Hemoglobin: 12.2 g/dL — ABNORMAL LOW (ref 13.0–17.0)
MCH: 30.4 pg (ref 26.0–34.0)
MCH: 30.7 pg (ref 26.0–34.0)
MCHC: 31.6 g/dL (ref 30.0–36.0)
MCHC: 32.1 g/dL (ref 30.0–36.0)
MCV: 96.3 fL (ref 78.0–100.0)
MCV: 95.6 fL (ref 78.0–100.0)
Platelets: 249 10*3/uL (ref 150–400)
Platelets: 224 10*3/uL (ref 150–400)
RBC: 4.01 MIL/uL — ABNORMAL LOW (ref 4.22–5.81)
RBC: 3.65 MIL/uL — AB (ref 4.22–5.81)
RDW: 13.5 % (ref 11.5–15.5)
RDW: 13.4 % (ref 11.5–15.5)
WBC: 14.2 10*3/uL — ABNORMAL HIGH (ref 4.0–10.5)
WBC: 12.3 10*3/uL — AB (ref 4.0–10.5)

## 2015-09-06 LAB — HEPARIN LEVEL (UNFRACTIONATED)
HEPARIN UNFRACTIONATED: 0.38 [IU]/mL (ref 0.30–0.70)
HEPARIN UNFRACTIONATED: 0.36 [IU]/mL (ref 0.30–0.70)
Heparin Unfractionated: 0.16 IU/mL — ABNORMAL LOW (ref 0.30–0.70)

## 2015-09-06 LAB — LACTIC ACID, PLASMA: Lactic Acid, Venous: 1.8 mmol/L (ref 0.5–1.9)

## 2015-09-06 LAB — HEPATIC FUNCTION PANEL
ALBUMIN: 2.5 g/dL — AB (ref 3.5–5.0)
ALK PHOS: 120 U/L (ref 38–126)
ALT: 44 U/L (ref 17–63)
AST: 28 U/L (ref 15–41)
Bilirubin, Direct: 1.1 mg/dL — ABNORMAL HIGH (ref 0.1–0.5)
Indirect Bilirubin: 1.1 mg/dL — ABNORMAL HIGH (ref 0.3–0.9)
TOTAL PROTEIN: 5.8 g/dL — AB (ref 6.5–8.1)
Total Bilirubin: 2.2 mg/dL — ABNORMAL HIGH (ref 0.3–1.2)

## 2015-09-06 LAB — GAMMA GT: GGT: 72 U/L — AB (ref 7–50)

## 2015-09-06 MED ORDER — WARFARIN - PHARMACIST DOSING INPATIENT
Freq: Every day | Status: DC
  Administered 2015-09-07: 18:00:00

## 2015-09-06 MED ORDER — WARFARIN SODIUM 5 MG PO TABS
7.0000 mg | ORAL_TABLET | Freq: Once | ORAL | Status: AC
  Administered 2015-09-06: 7 mg via ORAL
  Filled 2015-09-06: qty 1

## 2015-09-06 MED ORDER — MORPHINE SULFATE (PF) 2 MG/ML IV SOLN
1.0000 mg | Freq: Four times a day (QID) | INTRAVENOUS | Status: DC | PRN
  Administered 2015-09-06 – 2015-09-08 (×7): 1 mg via INTRAVENOUS
  Filled 2015-09-06 (×8): qty 1

## 2015-09-06 MED ORDER — ONDANSETRON HCL 4 MG/2ML IJ SOLN: 4.0000 mg | Freq: Four times a day (QID) | INTRAMUSCULAR | Status: DC | PRN

## 2015-09-06 MED ORDER — ALPRAZOLAM 0.5 MG PO TABS
2.0000 mg | ORAL_TABLET | Freq: Four times a day (QID) | ORAL | Status: DC | PRN
  Administered 2015-09-06 – 2015-09-10 (×9): 2 mg via ORAL
  Filled 2015-09-06 (×12): qty 4

## 2015-09-06 MED ORDER — HEPARIN BOLUS VIA INFUSION
2500.0000 [IU] | Freq: Once | INTRAVENOUS | Status: AC
  Administered 2015-09-06: 2500 [IU] via INTRAVENOUS
  Filled 2015-09-06: qty 2500

## 2015-09-06 MED ORDER — WARFARIN VIDEO
Freq: Once | Status: AC
  Administered 2015-09-07: 08:00:00

## 2015-09-06 MED ORDER — DOCUSATE SODIUM 100 MG PO CAPS
100.0000 mg | ORAL_CAPSULE | Freq: Every day | ORAL | Status: DC
  Administered 2015-09-06 – 2015-09-09 (×4): 100 mg via ORAL
  Filled 2015-09-06 (×5): qty 1

## 2015-09-06 MED ORDER — DEXTROSE 5 % IV SOLN
1.0000 g | INTRAVENOUS | Status: DC
  Administered 2015-09-06 – 2015-09-08 (×3): 1 g via INTRAVENOUS
  Filled 2015-09-06 (×4): qty 10

## 2015-09-06 MED ORDER — COUMADIN BOOK
Freq: Once | Status: AC
  Administered 2015-09-06: 20:00:00
  Filled 2015-09-06: qty 1

## 2015-09-06 MED ORDER — ACETAMINOPHEN 325 MG PO TABS
650.0000 mg | ORAL_TABLET | Freq: Four times a day (QID) | ORAL | Status: DC | PRN
Start: 2015-09-06 — End: 2015-09-10
  Administered 2015-09-06: 650 mg via ORAL
  Filled 2015-09-06: qty 2

## 2015-09-06 MED ORDER — SODIUM CHLORIDE 0.9 % IV BOLUS (SEPSIS)
500.0000 mL | Freq: Once | INTRAVENOUS | Status: AC
  Administered 2015-09-06: 500 mL via INTRAVENOUS

## 2015-09-06 NOTE — Progress Notes (Signed)
ANTICOAGULATION CONSULT NOTE - Follow-up Consult  Pharmacy Consult for HEPARIN Indication: VTE treatment  Allergies  Allergen Reactions  . Lopressor [Metoprolol Tartrate] Hives    Patient Measurements: Height: 5\' 8"  (172.7 cm) Weight: 183 lb (83 kg) IBW/kg (Calculated) : 68.4 HEPARIN DW (KG): 83  Vital Signs: Temp: 100.5 F (38.1 C) (08/09 2136) Temp Source: Oral (08/09 2136) BP: 100/56 (08/09 2136) Pulse Rate: 102 (08/09 2136)  Labs:  Recent Labs  09/05/15 1540 09/06/15 0400  HGB 14.0 12.2*  HCT 41.5 38.6*  PLT 233 249  APTT 30  --   LABPROT 13.4  --   INR 1.02  --   HEPARINUNFRC  --  0.16*  CREATININE 0.85 1.01    Estimated Creatinine Clearance: 77.5 mL/min (by C-G formula based on SCr of 1.01 mg/dL).   Assessment: 65yo male on heparin for mesenteric vein thrombosis. Heparin level subtherapeutic (0.16) on 1300 units/hr. No issues with line or bleeding reported per RN. Hgb down a bit.  Goal of Therapy:  Heparin level 0.3-0.7 units/ml Monitor platelets by anticoagulation protocol: Yes   Plan:   Rebolus heparin 2500 units  Increase heparin infusion to 1550 units/hr  Heparin level in 6 hr  Sherlon Handing, PharmD, BCPS Clinical pharmacist, pager [phone] 09/06/2015,5:34 AM

## 2015-09-06 NOTE — Care Management Important Message (Signed)
Important Message  Patient Details  Name: Larry Mullins MRN: [id number] Date of Birth: [birth year]   Medicare Important Message Given:  Yes    [name] 09/06/2015, 8:26 AM

## 2015-09-06 NOTE — Progress Notes (Signed)
Patient ID: Larry Mullins, male   DOB: 1951-01-14, 65 y.o.   MRN: [id number]                                                                PROGRESS NOTE                                                                                                                                                                                                             Patient Demographics:    Larry Mullins, is a 65 y.o. male, DOB - 1950-03-18, MLY:[id number]  Admit date - 09/05/2015   Admitting Physician Bonnell Public, MD  Outpatient Primary MD for the patient is Glo Herring., MD  LOS - 1  Outpatient Specialists:    Chief Complaint  Patient presents with  . Abdominal Pain       Brief Narrative  65 year old male with history of Hypertension, reformed alcoholic and recently informed that he was diabetic. Patient presents with 1 week history of abdominal pain, worse with food, drinks or medication. No nausea or vomiting and no diarrhea. 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 No fever or chills, no headache, no neck pain, no urinary symptoms.  ED Course: Hydration. Transfer to [facility] hospital Pertinent labs: As above EKG: Independently reviewed.    Subjective:    Larry Mullins today has,slight abdominal discomfort.No fever/chills,  No headache, No chest pain, No Nausea, No new weakness tingling or numbness, No Cough - SOB.    Assessment  & Plan :    Active Problems:   Superior mesenteric vein thrombosis (HCC)   Superior mesenteric vein thrombosis Heparin Vascular surgery consult  Dr. Bridgett Larsson contact me by phone=>  ,  No surgical intervention needed Would anticoagulate with heparin and bridge to coumadin  ? Cirrhosis/ascites Abnormal lft  (alk phos elevation) Check GGT Check cmp in am Consider further  w/up With iron studies, ceruloplasmin, alpha 1 antitrypsin, ana, ama, anti smooth muscle ab, celiac panel  Hyperglycemia Check hga1c   Avascular necrosis of the left femoral head May need to consult orthopedics   DVT prophylaxis: On full dose heparin IV Code Status: Full Family Communication:  Disposition  Plan: Undetermined   Consults called:  vascular surgery  Admission status: Inpatient       Lab Results  Component Value Date   PLT 249 09/06/2015    Antibiotics  :    Anti-infectives    None        Objective:   Vitals:   09/05/15 1901 09/05/15 2011 09/05/15 2136 09/06/15 0533  BP: 118/71 113/65 (!) 100/56 105/61  Pulse: 92 95 (!) 102 79  Resp: _0 Temp:   (!) 100.5 F (38.1 C) 98.3 F (36.8 C)  TempSrc:   Oral Oral  SpO2: 99% 96% 100% 99%  Weight:      Height:        Wt Readings from Last 3 Encounters:  09/05/15 83 kg (183 lb)    No intake or output data in the 24 hours ending 09/06/15 0804   Physical Exam  Awake Alert, Oriented X 3, No new F.N deficits, Normal affect [place].AT,PERRAL Supple Neck,No JVD, No cervical lymphadenopathy appriciated.  Symmetrical Chest wall movement, Good air movement bilaterally, CTAB RRR,No Gallops,Rubs or new Murmurs, No Parasternal Heave +ve B.Sounds, Abd Soft, No tenderness, No organomegaly appriciated, No rebound - guarding or rigidity. No Cyanosis, Clubbing or edema, No new Rash or bruise      Data Review:    CBC  Recent Labs Lab 09/05/15 1540 09/06/15 0400  WBC 14.1* 14.2*  HGB 14.0 12.2*  HCT 41.5 38.6*  PLT 233 249  MCV 94.3 96.3  MCH 31.8 30.4  MCHC 33.7 31.6  RDW 13.3 13.5    Chemistries   Recent Labs Lab 09/05/15 1540 09/05/15 2152 09/06/15 0400  NA 133*  --  138  K 3.9  --  4.6  CL 101  --  101  CO2 27  --  29  GLUCOSE 140*  --  146*  BUN 11  --  8  CREATININE 0.85  --  1.01  CALCIUM 8.1*  --  8.3*  MG  --  1.9  --   AST 28  --   --   ALT 54  --   --   ALKPHOS 127*   --   --   BILITOT 1.4*  --   --    ------------------------------------------------------------------------------------------------------------------ No results for input(s): CHOL, HDL, LDLCALC, TRIG, CHOLHDL, LDLDIRECT in the last 72 hours.  No results found for: HGBA1C ------------------------------------------------------------------------------------------------------------------ No results for input(s): TSH, T4TOTAL, T3FREE, THYROIDAB in the last 72 hours.  Invalid input(s): FREET3 ------------------------------------------------------------------------------------------------------------------ No results for input(s): VITAMINB12, FOLATE, FERRITIN, TIBC, IRON, RETICCTPCT in the last 72 hours.  Coagulation profile  Recent Labs Lab 09/05/15 1540  INR 1.02    No results for input(s): DDIMER in the last 72 hours.  Cardiac Enzymes No results for input(s): CKMB, TROPONINI, MYOGLOBIN in the last 168 hours.  Invalid input(s): CK ------------------------------------------------------------------------------------------------------------------ No results found for: BNP  Inpatient Medications  Scheduled Meds: . sodium chloride   Intravenous STAT  . ALPRAZolam  1 mg Oral TID  . aspirin EC  81 mg Oral Daily  . atenolol  25 mg Oral QHS  . atorvastatin  20 mg Oral q1800  . buPROPion  100 mg Oral BID  . gabapentin  300 mg Oral TID   Continuous Infusions: . heparin 1,550 Units/hr (09/06/15 0540)   PRN Meds:.HYDROcodone-acetaminophen, zolpidem  Micro Results No results found for this or any previous visit (from the past 240 hour(s)).  Radiology Reports Ct Abdomen Pelvis W Contrast  Result Date:  09/05/2015 CLINICAL DATA:  No oral contrast per MD order; upper abdominal pain x 1 week with nausea; no other complaints^188m ISOVUE-300 IOPAMIDOL (ISOVUE-300) INJECTION 61% EXAM: CT ABDOMEN AND PELVIS WITH CONTRAST TECHNIQUE: Multidetector CT imaging of the abdomen and pelvis was  performed using the standard protocol following bolus administration of intravenous contrast. CONTRAST:  1046mISOVUE-300 IOPAMIDOL (ISOVUE-300) INJECTION 61% COMPARISON:  None. FINDINGS: Lower chest: Mild bibasilar atelectasis. Status post median sternotomy. Coronary artery calcification and changes of CABG. Heart is normal in size. Hepatobiliary: The liver is homogeneous. Rounded margins of the liver, prominent caudate lobe raise the question of cirrhosis. Gallbladder is normal in CT appearance. Pancreas: Pancreas is normal in appearance. Spleen: Normal in appearance. Renal/Adrenal: Adrenal glands are normal in appearance. Kidneys show symmetric bilateral enhancement. No hydronephrosis or renal mass. Gastrointestinal tract: The stomach has a normal appearance. There is significant stranding of the small bowel mesentery. Bowel wall appears normal with no evidence for obstruction. Colonic loops are notable for significant diverticulosis but no acute inflammatory changes. The appendix is well seen and has a normal appearance. Reproductive/Pelvis: The urinary bladder, prostate, and seminal vesicles are normal in appearance. Small amount of free pelvic fluid is present. Vascular/Lymphatic: There is acute thrombus of the superior mesenteric vein, associated significant stranding of the mesentery. The portal vein appears patent. The superior mesenteric artery, celiac axis, and inferior mesenteric artery are normally opacified. There is atherosclerotic calcification of the abdominal aorta not associated with aneurysm or dissection. Musculoskeletal/Abdominal wall: Small supraumbilical fat containing midline hernia. Visualized osseous structures have a normal appearance. There is significant collapse and sclerosis of the left femoral head consistent with avascular necrosis. Other: none IMPRESSION: 1. Acute superior mesenteric vein thrombosis with associated ascites and mesenteric stranding. 2. Question of cirrhosis. 3. No  focal liver lesions. 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 5. Small fat containing anterior abdominal wall hernia. Critical Value/emergent results were called by telephone at the time of interpretation on 09/05/2015 at 6:15 pm to Dr. JUIsla Pence who verbally acknowledged these results. 6. Changes following CABG.  Confirm Electronically Signed   By: ElNolon Nations.D.   On: 09/05/2015 18:20    Time Spent in minutes  30   JaJani Gravel.D on 09/06/2015 at 8:04 AM  Between 7am to 7pm - Pager - [phone] 7pm go to [email or web address] - password [email or web address] -  Office  [phone]

## 2015-09-06 NOTE — Progress Notes (Signed)
ANTICOAGULATION CONSULT NOTE - Follow-up Consult  Pharmacy Consult for HEPARIN Indication: VTE treatment  Allergies  Allergen Reactions  . Lopressor [Metoprolol Tartrate] Hives    Patient Measurements: Height: 5\' 8"  (172.7 cm) Weight: 183 lb (83 kg) IBW/kg (Calculated) : 68.4 HEPARIN DW (KG): 83  Vital Signs: Temp: 98.3 F (36.8 C) (08/10 0533) Temp Source: Oral (08/10 0533) BP: 105/61 (08/10 0533) Pulse Rate: 79 (08/10 0533)  Labs:  Recent Labs  09/05/15 1540 09/06/15 0400 09/06/15 1207  HGB 14.0 12.2*  --   HCT 41.5 38.6*  --   PLT 233 249  --   APTT 30  --   --   LABPROT 13.4  --   --   INR 1.02  --   --   HEPARINUNFRC  --  0.16* 0.38  CREATININE 0.85 1.01  --     Estimated Creatinine Clearance: 77.5 mL/min (by C-G formula based on SCr of 1.01 mg/dL).   Assessment: 65yo male on heparin for mesenteric vein thrombosis. Heparin level is now at goal on 1550 units/hr.  Goal of Therapy:  Heparin level 0.3-0.7 units/ml Monitor platelets by anticoagulation protocol: Yes   Plan:  -No heparin changes needed -Will recheck a heparin level today  Hildred Laser, Pharm D 09/06/2015 1:25 PM

## 2015-09-06 NOTE — Progress Notes (Signed)
ANTICOAGULATION CONSULT NOTE - Initial Consult  Pharmacy Consult for heparin + warfarin Indication: superior mesenteric vein thrombosis  Allergies  Allergen Reactions  . Lopressor [Metoprolol Tartrate] Hives    Patient Measurements: Height: 5\' 8"  (172.7 cm) Weight: 183 lb (83 kg) IBW/kg (Calculated) : 68.4 Heparin Dosing Weight: 83 kg  Vital Signs:   Labs:  Recent Labs  09/05/15 1540 09/06/15 0400 09/06/15 1207  HGB 14.0 12.2*  --   HCT 41.5 38.6*  --   PLT 233 249  --   APTT 30  --   --   LABPROT 13.4  --   --   INR 1.02  --   --   HEPARINUNFRC  --  0.16* 0.38  CREATININE 0.85 1.01  --     Estimated Creatinine Clearance: 77.5 mL/min (by C-G formula based on SCr of 1.01 mg/dL).   Medical History: Past Medical History:  Diagnosis Date  . Hypertension     Medications:  See medical record  Assessment: 65yo male on heparin for mesenteric vein thrombosis, now starting warfarin. Not on anticoagulation PTA. Baseline INR from 8/9 is 1.02.  Heparin level currently therapeutic x 2 at 0.38 and 0.36. Will continue current rate.  Goal of Therapy:  INR 2-3 Monitor platelets by anticoagulation protocol: Yes   Plan:  Give warfarin 7 mg po x 1  Monitor daily INR, CBC, clinical course, s/sx of bleed, PO intake, DDI  Continue heparin infusion at 1550 units/hr Check anti-Xa level daily while on heparin Continue to monitor H&H and platelets Continue heparin to bridge until INR therapeutic x 2 days   Thank you for allowing [place] to participate in this patients care. Jens Som, PharmD Pager: [phone] 09/06/2015,6:21 PM

## 2015-09-06 NOTE — Progress Notes (Signed)
Pharmacy Antibiotic Note  Larry Mullins is a 65 y.o. male admitted on 09/05/2015 with UTI.  Pharmacy has been consulted for ceftriaxone dosing.  Plan: Start ceftriaxone 1 gram q 24 hours Monitor clinical progress, c/s, renal function, abx plan/LOT Pharmacy sign off, please re-consult if needed   Height: 5\' 8"  (172.7 cm) Weight: 183 lb (83 kg) IBW/kg (Calculated) : 68.4  Temp (24hrs), Avg:100.6 F (38.1 C), Min:98.3 F (36.8 C), Max:102.9 F (39.4 C)   Recent Labs Lab 09/05/15 1540 09/06/15 0400  WBC 14.1* 14.2*  CREATININE 0.85 1.01    Estimated Creatinine Clearance: 77.5 mL/min (by C-G formula based on SCr of 1.01 mg/dL).    Allergies  Allergen Reactions  . Lopressor [Metoprolol Tartrate] Hives    Antimicrobials this admission: 8/10 Rocephin >>     Dose adjustments this admission: n/a  Microbiology results: 8/10 BCx: sent 8/10 UCx: sent    Thank you for allowing [place] to participate in this patients care. Jens Som, PharmD Pager: [phone] 09/06/2015 9:00 PM

## 2015-09-07 ENCOUNTER — Inpatient Hospital Stay (HOSPITAL_COMMUNITY): Payer: Medicare Other

## 2015-09-07 DIAGNOSIS — R509 Fever, unspecified: Secondary | ICD-10-CM

## 2015-09-07 DIAGNOSIS — R1033 Periumbilical pain: Secondary | ICD-10-CM

## 2015-09-07 LAB — COMPREHENSIVE METABOLIC PANEL
ALT: 35 U/L (ref 17–63)
AST: 26 U/L (ref 15–41)
Albumin: 2.3 g/dL — ABNORMAL LOW (ref 3.5–5.0)
Alkaline Phosphatase: 124 U/L (ref 38–126)
Anion gap: 9 (ref 5–15)
BILIRUBIN TOTAL: 0.9 mg/dL (ref 0.3–1.2)
BUN: 8 mg/dL (ref 6–20)
CHLORIDE: 102 mmol/L (ref 101–111)
CO2: 28 mmol/L (ref 22–32)
CREATININE: 0.84 mg/dL (ref 0.61–1.24)
Calcium: 7.9 mg/dL — ABNORMAL LOW (ref 8.9–10.3)
Glucose, Bld: 124 mg/dL — ABNORMAL HIGH (ref 65–99)
Potassium: 3.7 mmol/L (ref 3.5–5.1)
Sodium: 139 mmol/L (ref 135–145)
TOTAL PROTEIN: 5.6 g/dL — AB (ref 6.5–8.1)

## 2015-09-07 LAB — ECHOCARDIOGRAM COMPLETE
Height: 68 in
WEIGHTICAEL: 2928 [oz_av]

## 2015-09-07 LAB — HEPARIN LEVEL (UNFRACTIONATED): Heparin Unfractionated: 0.32 IU/mL (ref 0.30–0.70)

## 2015-09-07 LAB — PROTIME-INR
INR: 1.11
PROTHROMBIN TIME: 14.3 s (ref 11.4–15.2)

## 2015-09-07 MED ORDER — SENNA 8.6 MG PO TABS
1.0000 | ORAL_TABLET | Freq: Every day | ORAL | Status: DC
  Administered 2015-09-07: 8.6 mg via ORAL
  Filled 2015-09-07: qty 1

## 2015-09-07 MED ORDER — WARFARIN SODIUM 5 MG PO TABS
7.0000 mg | ORAL_TABLET | Freq: Once | ORAL | Status: AC
  Administered 2015-09-07: 7 mg via ORAL
  Filled 2015-09-07: qty 1

## 2015-09-07 NOTE — Progress Notes (Signed)
  Echocardiogram 2D Echocardiogram has been performed.  Larry Mullins 09/07/2015, 2:31 PM

## 2015-09-07 NOTE — Progress Notes (Signed)
Temp 102.9 orally. R.N. Aware and K.Kirby N.P. Page and made aware. See orders

## 2015-09-07 NOTE — Progress Notes (Signed)
[facility] PROGRESS NOTE  Larry Mullins [id number] DOB: [birth year] DOA: 09/05/2015 PCP: Glo Herring., MD  Assessment/Plan: 65 y/o HTN, HPL, CAD h/o CABG, DM, Alcohol Abuse (quit 5 years ago), presented with sudden onset of abdominal pains 1 week ago, associated with nausea, worsening pain with food. 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 Dr. Maudie Mercury d/w vascular surgery who recommended heparin/counmadin bridging, no surgical intervention. Patient also noted to have a fever on admission, started on iv antibiotic pend cultures  Superior mesenteric vein thrombosis with associated ascites and mesenteric stranding -started on iv heparin/coumadin bridging per vascular surgery recommendations  CAD h/o CABG. No acute chest pains, had non sustained VT. check ecg, echo. Cont BB, statin, ASA  Fever. Unclear etiology. CXR: no clear infiltrates. Started on iv ceftriaxone for probable UTI, pend cultures   Questionable liver cirrhosis. H/o heavy alcohol use. Will need outpatient follow up with GI   Avascular necrosis of left femoral head. No tenderness on palpation, denies acute pains. obtain PT/OT eval. F/u with ortho as outpatient    Code Status: full Family Communication: d/w patient, RN (indicate person spoken with, relationship, and if by phone, the number) Disposition Plan: pend clinical improvement, on hep/coumadin    Consultants:  Vascular surgery   Procedures:  pend echo  Antibiotics:  Ceftriaxone 8/10 <<< (indicate start date, and stop date if known)  HPI/Subjective: Alert, no distress   Objective: Vitals:   09/07/15 0354 09/07/15 0421  BP: (!) 98/55   Pulse: 63   Resp: 20   Temp:  97.5 F (36.4 C)    Intake/Output Summary (Last 24 hours) at 09/07/15 0752 Last data filed at 09/07/15 [id number]  Gross per 24 hour  Intake               650 ml  Output             2276 ml  Net            -1626 ml   Filed Weights   09/05/15 1532  Weight: 83 kg (183 lb)    Exam:   General:  Comfortable   Cardiovascular: s1,s2 rrr  Respiratory: CTA BL   Abdomen: mild diffuse tender, no rebound.   Musculoskeletal: no edema    Data Reviewed: Basic Metabolic Panel:  Recent Labs Lab 09/05/15 1540 09/05/15 2152 09/06/15 0400 09/07/15 0458  NA 133*  --  138 139  K 3.9  --  4.6 3.7  CL 101  --  101 102  CO2 27  --  29 28  GLUCOSE 140*  --  146* 124*  BUN 11  --  8 8  CREATININE 0.85  --  1.01 0.84  CALCIUM 8.1*  --  8.3* 7.9*  MG  --  1.9  --   --   PHOS  --  3.4  --   --    Liver Function Tests:  Recent Labs Lab 09/05/15 1540 09/06/15 0400 09/07/15 0458  AST 28 28 26   ALT 54 44 35  ALKPHOS 127* 120 124  BILITOT 1.4* 2.2* 0.9  PROT 7.2 5.8* 5.6*  ALBUMIN 3.1* 2.5* 2.3*    Recent Labs Lab 09/05/15 1540  LIPASE 24   No results for input(s): AMMONIA in the last 168 hours. CBC:  Recent Labs Lab 09/05/15 1540 09/06/15 0400 09/06/15 2047  WBC 14.1* 14.2* 12.3*  HGB 14.0 12.2* 11.2*  HCT 41.5 38.6* 34.9*  MCV 94.3 96.3 95.6  PLT 233 249 224   Cardiac Enzymes: No results for input(s): CKTOTAL, CKMB, CKMBINDEX, TROPONINI in the last 168 hours. BNP (last 3 results) No results for input(s): BNP in the last 8760 hours.  ProBNP (last 3 results) No results for input(s): PROBNP in the last 8760 hours.  CBG: No results for input(s): GLUCAP in the last 168 hours.  No results found for this or any previous visit (from the past 240 hour(s)).   Studies: Dg Chest 2 View  Result Date: 09/06/2015 CLINICAL DATA:  Hypertension. EXAM: CHEST  2 VIEW COMPARISON:  No recent prior. FINDINGS: Prior CABG. Heart size normal. No focal infiltrate. No pleural effusion or pneumothorax. No acute bony abnormality . IMPRESSION: 1. Prior CABG. 2.  No acute pulmonary disease. Electronically Signed   By: Marcello Moores  Register    On: 09/06/2015 09:25   Ct Abdomen Pelvis W Contrast  Result Date: 09/05/2015 CLINICAL DATA:  No oral contrast per MD order; upper abdominal pain x 1 week with nausea; no other complaints^148mL ISOVUE-300 IOPAMIDOL (ISOVUE-300) INJECTION 61% EXAM: CT ABDOMEN AND PELVIS WITH CONTRAST TECHNIQUE: Multidetector CT imaging of the abdomen and pelvis was performed using the standard protocol following bolus administration of intravenous contrast. CONTRAST:  125mL ISOVUE-300 IOPAMIDOL (ISOVUE-300) INJECTION 61% COMPARISON:  None. FINDINGS: Lower chest: Mild bibasilar atelectasis. Status post median sternotomy. Coronary artery calcification and changes of CABG. Heart is normal in size. Hepatobiliary: The liver is homogeneous. Rounded margins of the liver, prominent caudate lobe raise the question of cirrhosis. Gallbladder is normal in CT appearance. Pancreas: Pancreas is normal in appearance. Spleen: Normal in appearance. Renal/Adrenal: Adrenal glands are normal in appearance. Kidneys show symmetric bilateral enhancement. No hydronephrosis or renal mass. Gastrointestinal tract: The stomach has a normal appearance. There is significant stranding of the small bowel mesentery. Bowel wall appears normal with no evidence for obstruction. Colonic loops are notable for significant diverticulosis but no acute inflammatory changes. The appendix is well seen and has a normal appearance. Reproductive/Pelvis: The urinary bladder, prostate, and seminal vesicles are normal in appearance. Small amount of free pelvic fluid is present. Vascular/Lymphatic: There is acute thrombus of the superior mesenteric vein, associated significant stranding of the mesentery. The portal vein appears patent. The superior mesenteric artery, celiac axis, and inferior mesenteric artery are normally opacified. There is atherosclerotic calcification of the abdominal aorta not associated with aneurysm or dissection. Musculoskeletal/Abdominal wall: Small  supraumbilical fat containing midline hernia. Visualized osseous structures have a normal appearance. There is significant collapse and sclerosis of the left femoral head consistent with avascular necrosis. Other: none IMPRESSION: 1. Acute superior mesenteric vein thrombosis with associated ascites and mesenteric stranding. 2. Question of cirrhosis. 3. No focal liver lesions. 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 5. Small fat containing anterior abdominal wall hernia. Critical Value/emergent results were called by telephone at the time of interpretation on 09/05/2015 at 6:15 pm to Dr. Isla Pence , who verbally acknowledged these results. 6. Changes following CABG.  Confirm Electronically Signed   By: Nolon Nations M.D.   On: 09/05/2015 18:20    Scheduled Meds: . aspirin EC  81 mg Oral Daily  . atenolol  25 mg Oral QHS  . atorvastatin  20 mg Oral q1800  . buPROPion  100 mg Oral BID  . cefTRIAXone (ROCEPHIN)  IV  1 g Intravenous Q24H  . docusate sodium  100 mg Oral Daily  . gabapentin  300 mg  Oral TID  . warfarin   Does not apply Once  . Warfarin - Pharmacist Dosing Inpatient   Does not apply q1800   Continuous Infusions: . heparin 1,550 Units/hr (09/07/15 0011)    Active Problems:   Superior mesenteric vein thrombosis (HCC)    Time spent: >35 minutes    Kinnie Feil  [facility] Pager [phone]. If 7PM-7AM, please contact night-coverage at [email or web address], password [facility] 09/07/2015, 7:52 AM  LOS: 2 days

## 2015-09-07 NOTE — Progress Notes (Signed)
Patient. Had 6 bts v-tach Pt. Voice no complaints and was awake at this time. Patient has been having freg pvc's

## 2015-09-07 NOTE — Progress Notes (Signed)
ANTICOAGULATION CONSULT NOTE - Initial Consult  Pharmacy Consult for heparin + warfarin Indication: superior mesenteric vein thrombosis  Allergies  Allergen Reactions  . Lopressor [Metoprolol Tartrate] Hives    Patient Measurements: Height: 5\' 8"  (172.7 cm) Weight: 183 lb (83 kg) IBW/kg (Calculated) : 68.4 Heparin Dosing Weight: 83 kg  Vital Signs: Temp: 97.5 F (36.4 C) (08/11 0421) Temp Source: Oral (08/11 0421) BP: 98/55 (08/11 0354) Pulse Rate: 63 (08/11 0354) Labs:  Recent Labs  09/05/15 1540  09/06/15 0400 09/06/15 1207 09/06/15 1900 09/06/15 2047 09/07/15 0458 09/07/15 1050  HGB 14.0  --  12.2*  --   --  11.2*  --   --   HCT 41.5  --  38.6*  --   --  34.9*  --   --   PLT 233  --  249  --   --  224  --   --   APTT 30  --   --   --   --   --   --   --   LABPROT 13.4  --   --   --   --   --  14.3  --   INR 1.02  --   --   --   --   --  1.11  --   HEPARINUNFRC  --   < > 0.16* 0.38 0.36  --   --  0.32  CREATININE 0.85  --  1.01  --   --   --  0.84  --   < > = values in this interval not displayed.  Estimated Creatinine Clearance: 93.2 mL/min (by C-G formula based on SCr of 0.84 mg/dL).   Medical History: Past Medical History:  Diagnosis Date  . Hypertension     Medications:  See medical record  Assessment: 65 yo male on heparin + warfarin for mesenteric vein thrombosis. Not on anticoagulation PTA. INR this AM 1.11 after 7 mg dose last night. HL therapeutic this AM at 0.32 on 1550 units/hr. CBC stable. No issues per RN.  Goal of Therapy:  INR 2-3 Monitor platelets by anticoagulation protocol: Yes   Plan:  - Warfarin 7 mg PO x 1 again tonight - Continue heparin infusion at 1550 units/hr - Daily HL, CBC, INR - Monitor s/sx of bleeding  Cassie L. Nicole Kindred, PharmD Clinical Pharmacist Pager: [phone] 09/07/2015 1:14 PM

## 2015-09-07 NOTE — Evaluation (Signed)
Physical Therapy Evaluation Patient Details Name: Larry Mullins MRN: [id number] DOB: [birth year] Today's Date: 09/07/2015   History of Present Illness  Patient is a 65 y/o male with hx of HTN, reformed alcoholic and recently informed that he was diabetic presents with abdominal pain. 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   Clinical Impression  Patient presents with generalized weakness, pain and balance deficits s/p above impacting mobility. Tolerated gait training with Min guard assist for safety. PTA, pt using SPC for ambulation and independent for ADLs. Pt has supportive wife. Encouraged ambulation multiple times per day to improve endurance and overall strength/mobility. Pt agreeable. Will follow acutely to maximize independence and mobility prior to return home.     Follow Up Recommendations No PT follow up;Supervision - Intermittent    Equipment Recommendations  None recommended by PT    Recommendations for Other Services       Precautions / Restrictions Precautions Precautions: Fall Restrictions Weight Bearing Restrictions: No      Mobility  Bed Mobility Overal bed mobility: Needs Assistance Bed Mobility: Rolling;Sidelying to Sit;Sit to Supine Rolling: Modified independent (Device/Increase time) Sidelying to sit: Modified independent (Device/Increase time);HOB elevated   Sit to supine: Modified independent (Device/Increase time);HOB elevated   General bed mobility comments: Use of rails, but no physical assist needed.   Transfers Overall transfer level: Needs assistance Equipment used: Rolling walker (2 wheeled) Transfers: Sit to/from Stand Sit to Stand: Min guard         General transfer comment: Min guard for safety.   Ambulation/Gait Ambulation/Gait assistance: Min guard Ambulation Distance  (Feet): 200 Feet Assistive device: Rolling walker (2 wheeled) Gait Pattern/deviations: Step-through pattern;Decreased stride length Gait velocity: decreased   General Gait Details: Slow, mostly steady gait. Difficulty with turns with RW but no overt LOB. HR stable in 80s. Taking 1 UE off walker at times.  Stairs            Wheelchair Mobility    Modified Rankin (Stroke Patients Only)       Balance Overall balance assessment: Needs assistance Sitting-balance support: Feet supported;No upper extremity supported Sitting balance-Leahy Scale: Good Sitting balance - Comments: ABle to donn/doff socks sitting EOB without difficulty.    Standing balance support: During functional activity Standing balance-Leahy Scale: Fair Standing balance comment: Able to stand statically without UE support but requires UE support for ambulation.                             Pertinent Vitals/Pain Pain Assessment: Faces Faces Pain Scale: Hurts a little bit Pain Location: lower abdomen Pain Descriptors / Indicators: Sore Pain Intervention(s): Monitored during session;Repositioned    Home Living Family/patient expects to be discharged to:: Private residence Living Arrangements: Spouse/significant other;Other relatives (84 y/o grandson) Available Help at Discharge: Family;Available 24 hours/day Type of Home: House Home Access: Stairs to enter Entrance Stairs-Rails: None Entrance Stairs-Number of Steps: 1 wide step + threshold Home Layout: One level Home Equipment: Walker - 4 wheels;Cane - single point;Shower seat - built in      Prior Function Level of Independence: Independent with assistive device(s)         Comments: Pt uses SPC for ambulation. Does not drive.      Hand Dominance        Extremity/Trunk Assessment   Upper Extremity Assessment: Defer  to OT evaluation           Lower Extremity Assessment: Generalized weakness         Communication    Communication: No difficulties  Cognition Arousal/Alertness: Awake/alert Behavior During Therapy: WFL for tasks assessed/performed Overall Cognitive Status: Within Functional Limits for tasks assessed                      General Comments General comments (skin integrity, edema, etc.): Wife present towards end of session.    Exercises        Assessment/Plan    PT Assessment Patient needs continued PT services  PT Diagnosis Difficulty walking   PT Problem List Decreased strength;Decreased mobility;Decreased activity tolerance;Decreased balance;Pain  PT Treatment Interventions Gait training;Therapeutic exercise;Therapeutic activities;Patient/family education;Stair training;Balance training;Functional mobility training   PT Goals (Current goals can be found in the Care Plan section) Acute Rehab PT Goals Patient Stated Goal: none stated PT Goal Formulation: With patient Time For Goal Achievement: 09/21/15 Potential to Achieve Goals: Good    Frequency Min 3X/week   Barriers to discharge        Co-evaluation               End of Session Equipment Utilized During Treatment: Gait belt Activity Tolerance: Patient tolerated treatment well Patient left: in bed;with call bell/phone within reach;with family/visitor present Nurse Communication: Mobility status         Time: 1345-1410 PT Time Calculation (min) (ACUTE ONLY): 25 min   Charges:   PT Evaluation $PT Eval Low Complexity: 1 Procedure PT Treatments $Gait Training: 8-22 mins   PT G Codes:        Stacia Feazell A Caellum Mancil 09/07/2015, 2:17 PM Wray [place]ns, [place], DPT [phone]

## 2015-09-08 LAB — HEMOGLOBIN A1C
HEMOGLOBIN A1C: 6 % — AB (ref 4.8–5.6)
MEAN PLASMA GLUCOSE: 126 mg/dL

## 2015-09-08 LAB — COMPREHENSIVE METABOLIC PANEL
ALBUMIN: 2.1 g/dL — AB (ref 3.5–5.0)
ALT: 28 U/L (ref 17–63)
ANION GAP: 6 (ref 5–15)
AST: 20 U/L (ref 15–41)
Alkaline Phosphatase: 112 U/L (ref 38–126)
BUN: 7 mg/dL (ref 6–20)
CALCIUM: 7.7 mg/dL — AB (ref 8.9–10.3)
CHLORIDE: 100 mmol/L — AB (ref 101–111)
CO2: 30 mmol/L (ref 22–32)
CREATININE: 0.8 mg/dL (ref 0.61–1.24)
GFR calc Af Amer: >60 mL/min (ref >60)
GFR calc non Af Amer: >60 mL/min (ref >60)
GLUCOSE: 150 mg/dL — AB (ref 65–99)
POTASSIUM: 3.6 mmol/L (ref 3.5–5.1)
SODIUM: 136 mmol/L (ref 135–145)
Total Bilirubin: 0.6 mg/dL (ref 0.3–1.2)
Total Protein: 5.3 g/dL — ABNORMAL LOW (ref 6.5–8.1)

## 2015-09-08 LAB — URINE CULTURE: Culture: NO GROWTH

## 2015-09-08 LAB — PROTIME-INR
INR: 1.32
PROTHROMBIN TIME: 16.5 s — AB (ref 11.4–15.2)

## 2015-09-08 LAB — CBC
HEMATOCRIT: 33.8 % — AB (ref 39.0–52.0)
HEMOGLOBIN: 10.8 g/dL — AB (ref 13.0–17.0)
MCH: 30.5 pg (ref 26.0–34.0)
MCHC: 32 g/dL (ref 30.0–36.0)
MCV: 95.5 fL (ref 78.0–100.0)
Platelets: 226 10*3/uL (ref 150–400)
RBC: 3.54 MIL/uL — AB (ref 4.22–5.81)
RDW: 13.4 % (ref 11.5–15.5)
WBC: 10.7 10*3/uL — ABNORMAL HIGH (ref 4.0–10.5)

## 2015-09-08 LAB — HEPARIN LEVEL (UNFRACTIONATED)
HEPARIN UNFRACTIONATED: 0.34 [IU]/mL (ref 0.30–0.70)
HEPARIN UNFRACTIONATED: 0.5 [IU]/mL (ref 0.30–0.70)
Heparin Unfractionated: 0.21 IU/mL — ABNORMAL LOW (ref 0.30–0.70)

## 2015-09-08 MED ORDER — POLYETHYLENE GLYCOL 3350 17 G PO PACK
17.0000 g | PACK | Freq: Every day | ORAL | Status: DC
  Administered 2015-09-08 – 2015-09-09 (×2): 17 g via ORAL
  Filled 2015-09-08 (×3): qty 1

## 2015-09-08 MED ORDER — SENNA 8.6 MG PO TABS
1.0000 | ORAL_TABLET | Freq: Two times a day (BID) | ORAL | Status: DC
  Administered 2015-09-08 – 2015-09-09 (×3): 8.6 mg via ORAL
  Filled 2015-09-08 (×6): qty 1

## 2015-09-08 MED ORDER — WARFARIN SODIUM 2 MG PO TABS
7.0000 mg | ORAL_TABLET | Freq: Once | ORAL | Status: AC
  Administered 2015-09-08: 7 mg via ORAL
  Filled 2015-09-08: qty 1

## 2015-09-08 MED ORDER — COUMADIN BOOK
Freq: Once | Status: DC
  Filled 2015-09-08: qty 1

## 2015-09-08 MED ORDER — WARFARIN VIDEO
Freq: Once | Status: AC
  Administered 2015-09-08: 15:00:00

## 2015-09-08 NOTE — Progress Notes (Signed)
ANTICOAGULATION CONSULT NOTE - follow-up Consult  Pharmacy Consult for heparin + warfarin Indication: superior mesenteric vein thrombosis  Allergies  Allergen Reactions  . Lopressor [Metoprolol Tartrate] Hives    Patient Measurements: Height: 5\' 8"  (172.7 cm) Weight: 183 lb (83 kg) IBW/kg (Calculated) : 68.4 Heparin Dosing Weight: 83 kg  Vital Signs: Temp: 98.2 F (36.8 C) (08/12 0332) Temp Source: Oral (08/12 0332) BP: 98/46 (08/12 0332) Pulse Rate: 85 (08/12 0332) Labs:  Recent Labs  09/05/15 1540 09/06/15 0400  09/06/15 1900 09/06/15 2047 09/07/15 0458 09/07/15 1050 09/08/15 0304  HGB 14.0 12.2*  --   --  11.2*  --   --  10.8*  HCT 41.5 38.6*  --   --  34.9*  --   --  33.8*  PLT 233 249  --   --  224  --   --  226  APTT 30  --   --   --   --   --   --   --   LABPROT 13.4  --   --   --   --  14.3  --  16.5*  INR 1.02  --   --   --   --  1.11  --  1.32  HEPARINUNFRC  --  0.16*  < > 0.36  --   --  0.32 0.21*  CREATININE 0.85 1.01  --   --   --  0.84  --   --   < > = values in this interval not displayed.  Estimated Creatinine Clearance: 93.2 mL/min (by C-G formula based on SCr of 0.84 mg/dL).   Assessment: 65 yo male on heparin + warfarin for mesenteric vein thrombosis. Not on anticoagulation PTA. INR this AM trending up to 1.32. Heparin level down to subtherapeutic (0.21) on gtt at 1550 units/hr. CBC stable. No issues per RN.  Goal of Therapy:  INR 2-3 Monitor platelets by anticoagulation protocol: Yes   Plan:  - Warfarin 7 mg PO x 1 again tonight - Increase heparin infusion to 1700 units/hr - Will f/u 6 hr heparin level  Sherlon Handing, PharmD, BCPS Clinical pharmacist, pager [phone] 09/08/2015 4:16 AM

## 2015-09-08 NOTE — Progress Notes (Signed)
ANTICOAGULATION CONSULT NOTE - follow-up Consult  Pharmacy Consult for heparin + warfarin Indication: superior mesenteric vein thrombosis  Allergies  Allergen Reactions  . Lopressor [Metoprolol Tartrate] Hives    Patient Measurements: Height: 5\' 8"  (172.7 cm) Weight: 183 lb (83 kg) IBW/kg (Calculated) : 68.4 Heparin Dosing Weight: 83 kg  Vital Signs: Temp: 98.2 F (36.8 C) (08/12 0332) Temp Source: Oral (08/12 0332) BP: 98/46 (08/12 0332) Pulse Rate: 85 (08/12 0332) Labs:  Recent Labs  09/05/15 1540 09/06/15 0400  09/06/15 1900 09/06/15 2047 09/07/15 0458 09/07/15 1050 09/08/15 0304  HGB 14.0 12.2*  --   --  11.2*  --   --  10.8*  HCT 41.5 38.6*  --   --  34.9*  --   --  33.8*  PLT 233 249  --   --  224  --   --  226  APTT 30  --   --   --   --   --   --   --   LABPROT 13.4  --   --   --   --  14.3  --  16.5*  INR 1.02  --   --   --   --  1.11  --  1.32  HEPARINUNFRC  --  0.16*  < > 0.36  --   --  0.32 0.21*  CREATININE 0.85 1.01  --   --   --  0.84  --  0.80  < > = values in this interval not displayed.  Estimated Creatinine Clearance: 97.9 mL/min (by C-G formula based on SCr of 0.8 mg/dL).   Assessment: 65 yo male on heparin + warfarin for mesenteric vein thrombosis. Not on anticoagulation PTA. INR this AM trending up to 1.32.   HL-0.34 on 1700 units/hr - Therapeutic X 1   Goal of Therapy:  INR 2-3 Monitor platelets by anticoagulation protocol: Yes   Plan:  - Continue heparin at 1700 units/hr - 6 hour cHL - Warfarin 7 mg PO x 1 again tonight - Daily INR, HL, CBC, s/sx of bleeding   Jearld Fenton D Pharmacy Resident [phone] 09/08/2015 11:52 AM

## 2015-09-08 NOTE — Progress Notes (Signed)
[facility] PROGRESS NOTE  Larry Mullins [id number] DOB: 1950-08-22 DOA: 09/05/2015 PCP: Glo Herring., MD  Brief summary  65 y/o HTN, HPL, CAD h/o CABG, DM, Alcohol Abuse (quit 5 years ago), presented with sudden onset of abdominal pains 1 week ago, associated with nausea, worsening pain with food. 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 Dr. Maudie Mercury d/w vascular surgery who recommended heparin/counmadin bridging, no surgical intervention. 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 No acute chest pains, had non sustained VT. check ecg, echo. Cont BB, statin, ASA  Fever. Unclear etiology. CXR: no clear infiltrates. Started on iv ceftriaxone for probable UTI, pend cultures. Afebrile 24 hrs    Questionable liver cirrhosis. H/o heavy alcohol use. Will need outpatient follow up with GI   Avascular necrosis of left femoral head. No tenderness on palpation, denies acute pains. obtain PT/OT eval. F/u with ortho as outpatient    Code Status: full Family Communication: d/w patient, RN (indicate person spoken with, relationship, and if by phone, the number) Disposition Plan: pend clinical improvement, on hep/coumadin    Consultants:  Vascular surgery   Procedures: Echo Study Conclusions  - Left ventricle: The cavity size was normal. There was mild focal   basal hypertrophy of the septum. Systolic function was normal.   The estimated ejection fraction was in the range of 55% to 60%.   Wall motion was normal; there were no regional wall motion   abnormalities. Left ventricular diastolic function  parameters   were normal. - Aortic valve: There was trivial regurgitation.  - Mitral valve: Calcified annulus.  Antibiotics:  Ceftriaxone 8/10 <<< (indicate start date, and stop date if known)  HPI/Subjective: Alert, no distress   Objective: Vitals:   09/07/15 2033 09/08/15 0332  BP: (!) 97/50 (!) 98/46  Pulse: 77 85  Resp: 20 20  Temp: 98.8 F (37.1 C) 98.2 F (36.8 C)    Intake/Output Summary (Last 24 hours) at 09/08/15 0838 Last data filed at 09/08/15 0332  Gross per 24 hour  Intake              720 ml  Output             1850 ml  Net            -1130 ml   Filed Weights   09/05/15 1532  Weight: 83 kg (183 lb)    Exam:   General:  Comfortable   Cardiovascular: s1,s2 rrr  Respiratory: CTA BL   Abdomen: mild diffuse tender, no rebound.   Musculoskeletal: no edema    Data Reviewed: Basic Metabolic Panel:  Recent Labs Lab 09/05/15 1540 09/05/15 2152 09/06/15 0400 09/07/15 0458 09/08/15 0304  NA 133*  --  138 139 136  K 3.9  --  4.6 3.7 3.6  CL 101  --  101 102 100*  CO2 27  --  29 28 30   GLUCOSE 140*  --  146* 124* 150*  BUN 11  --  8 8 7   CREATININE 0.85  --  1.01 0.84 0.80  CALCIUM 8.1*  --  8.3* 7.9* 7.7*  MG  --  1.9  --   --   --  PHOS  --  3.4  --   --   --    Liver Function Tests:  Recent Labs Lab 09/05/15 1540 09/06/15 0400 09/07/15 0458 09/08/15 0304  AST 28 28 26 20   ALT 54 44 35 28  ALKPHOS 127* 120 124 112  BILITOT 1.4* 2.2* 0.9 0.6  PROT 7.2 5.8* 5.6* 5.3*  ALBUMIN 3.1* 2.5* 2.3* 2.1*    Recent Labs Lab 09/05/15 1540  LIPASE 24   No results for input(s): AMMONIA in the last 168 hours. CBC:  Recent Labs Lab 09/05/15 1540 09/06/15 0400 09/06/15 2047 09/08/15 0304  WBC 14.1* 14.2* 12.3* 10.7*  HGB 14.0 12.2* 11.2* 10.8*  HCT 41.5 38.6* 34.9* 33.8*  MCV 94.3 96.3 95.6 95.5  PLT 233 249 224 226   Cardiac Enzymes: No results for input(s): CKTOTAL, CKMB, CKMBINDEX, TROPONINI in the last 168 hours. BNP  (last 3 results) No results for input(s): BNP in the last 8760 hours.  ProBNP (last 3 results) No results for input(s): PROBNP in the last 8760 hours.  CBG: No results for input(s): GLUCAP in the last 168 hours.  Recent Results (from the past 240 hour(s))  Culture, blood (Routine X 2) w Reflex to ID Panel     Status: None (Preliminary result)   Collection Time: 09/06/15  8:34 PM  Result Value Ref Range Status   Specimen Description BLOOD LEFT HAND  Final   Special Requests   Final    BOTTLES DRAWN AEROBIC AND ANAEROBIC 8CC BLUE [place] RED   Culture NO GROWTH < 24 HOURS  Final   Report Status PENDING  Incomplete  Culture, blood (Routine X 2) w Reflex to ID Panel     Status: None (Preliminary result)   Collection Time: 09/06/15  8:39 PM  Result Value Ref Range Status   Specimen Description BLOOD RIGHT HAND  Final   Special Requests BOTTLES DRAWN AEROBIC AND ANAEROBIC 10CC   Final   Culture NO GROWTH < 24 HOURS  Final   Report Status PENDING  Incomplete     Studies: Dg Chest 2 View  Result Date: 09/06/2015 CLINICAL DATA:  Hypertension. EXAM: CHEST  2 VIEW COMPARISON:  No recent prior. FINDINGS: Prior CABG. Heart size normal. No focal infiltrate. No pleural effusion or pneumothorax. No acute bony abnormality . IMPRESSION: 1. Prior CABG. 2.  No acute pulmonary disease. Electronically Signed   By: Marcello Moores  Register   On: 09/06/2015 09:25    Scheduled Meds: . aspirin EC  81 mg Oral Daily  . atenolol  25 mg Oral QHS  . atorvastatin  20 mg Oral q1800  . buPROPion  100 mg Oral BID  . cefTRIAXone (ROCEPHIN)  IV  1 g Intravenous Q24H  . docusate sodium  100 mg Oral Daily  . gabapentin  300 mg Oral TID  . senna  1 tablet Oral Daily  . warfarin  7 mg Oral ONCE-1800  . Warfarin - Pharmacist Dosing Inpatient   Does not apply q1800   Continuous Infusions: . heparin 1,700 Units/hr (09/08/15 0530)    Active Problems:   Superior mesenteric vein thrombosis ([facility])    Time spent: >35  minutes    Kinnie Feil  [facility] Pager [phone]. If 7PM-7AM, please contact night-coverage at [email or web address], password [facility] 09/08/2015, 8:38 AM  LOS: 3 days

## 2015-09-08 NOTE — Discharge Instructions (Signed)

## 2015-09-09 DIAGNOSIS — N3000 Acute cystitis without hematuria: Secondary | ICD-10-CM

## 2015-09-09 LAB — CBC
HCT: 33.9 % — ABNORMAL LOW (ref 39.0–52.0)
Hemoglobin: 10.7 g/dL — ABNORMAL LOW (ref 13.0–17.0)
MCH: 30.1 pg (ref 26.0–34.0)
MCHC: 31.6 g/dL (ref 30.0–36.0)
MCV: 95.5 fL (ref 78.0–100.0)
Platelets: 260 10*3/uL (ref 150–400)
RBC: 3.55 MIL/uL — ABNORMAL LOW (ref 4.22–5.81)
RDW: 13.5 % (ref 11.5–15.5)
WBC: 10.5 10*3/uL (ref 4.0–10.5)

## 2015-09-09 LAB — PROTIME-INR
INR: 2.04
Prothrombin Time: 23.3 seconds — ABNORMAL HIGH (ref 11.4–15.2)

## 2015-09-09 LAB — HEPARIN LEVEL (UNFRACTIONATED): Heparin Unfractionated: 0.39 IU/mL (ref 0.30–0.70)

## 2015-09-09 MED ORDER — WARFARIN SODIUM 3 MG PO TABS
3.0000 mg | ORAL_TABLET | Freq: Once | ORAL | Status: AC
  Administered 2015-09-09: 3 mg via ORAL
  Filled 2015-09-09: qty 1

## 2015-09-09 NOTE — Progress Notes (Signed)
[facility] PROGRESS NOTE  Larry Mullins [id number] DOB: [birth year] DOA: 09/05/2015 PCP: Glo Herring., MD  Brief summary  65 y/o HTN, HPL, CAD h/o CABG, Alcohol Abuse (quit 5 years ago), presented with sudden onset of abdominal pains 1 week ago, associated with nausea, worsening pain with food. 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 Dr. Maudie Mercury d/w vascular surgery who recommended heparin/coumadin bridging, no surgical intervention. 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 INR 2.0. Cont coumadin/heparin today, recheck INR am.   CAD h/o CABG. No acute chest pains. Cont BB, statin, ASA  Fever. Unclear etiology. CXR: no clear infiltrates. Received iv ceftriaxone (8/10-8/13) for probable UTI, blood/urine cultures: NGTD. Afebrile 48 hrs. D/c atx   Questionable liver cirrhosis. H/o heavy alcohol use. Will need outpatient follow up with GI   Avascular necrosis of left femoral head. No tenderness on palpation, denies acute pains. F/u with ortho as outpatient   Disposition: possible tomorrow, pend repeat INR AM.   Code Status: full Family Communication: d/w patient, RN (indicate person spoken with, relationship, and if by phone, the number) Disposition Plan: pend repeat INR AM. On IV hep/coumadin    Consultants:  Vascular surgery   Procedures: Echo Study Conclusions  - Left ventricle: The cavity size was normal. There was mild focal   basal hypertrophy of the septum. Systolic function was normal.   The estimated ejection fraction was in the range of 55% to 60%.   Wall motion was normal; there were no  regional wall motion   abnormalities. Left ventricular diastolic function parameters   were normal. - Aortic valve: There was trivial regurgitation.  - Mitral valve: Calcified annulus.  Antibiotics:  Ceftriaxone 8/10 <<<8/13 (indicate start date, and stop date if known)  HPI/Subjective: Alert, no distress, reports feeling better. Had mils episode of abdominal pain at night. No vomiting.    Objective: Vitals:   09/08/15 1351 09/08/15 2045  BP: (!) 108/58 112/60  Pulse: 77 79  Resp: 18 19  Temp: 98.2 F (36.8 C) 98.6 F (37 C)    Intake/Output Summary (Last 24 hours) at 09/09/15 0854 Last data filed at 09/09/15 0400  Gross per 24 hour  Intake           1102.5 ml  Output             2150 ml  Net          -1047.5 ml   Filed Weights   09/05/15 1532  Weight: 83 kg (183 lb)    Exam:   General:  Comfortable   Cardiovascular: s1,s2 rrr  Respiratory: CTA BL   Abdomen: mild diffuse tender, no rebound.   Musculoskeletal: no edema    Data Reviewed: Basic Metabolic Panel:  Recent Labs Lab 09/05/15 1540 09/05/15 2152 09/06/15 0400 09/07/15 0458 09/08/15 0304  NA 133*  --  138 139 136  K 3.9  --  4.6 3.7 3.6  CL 101  --  101 102 100*  CO2 27  --  29 28 30   GLUCOSE 140*  --  146* 124* 150*  BUN 11  --  8 8 7   CREATININE 0.85  --  1.01 0.84 0.80  CALCIUM 8.1*  --  8.3*  7.9* 7.7*  MG  --  1.9  --   --   --   PHOS  --  3.4  --   --   --    Liver Function Tests:  Recent Labs Lab 09/05/15 1540 09/06/15 0400 09/07/15 0458 09/08/15 0304  AST 28 28 26 20   ALT 54 44 35 28  ALKPHOS 127* 120 124 112  BILITOT 1.4* 2.2* 0.9 0.6  PROT 7.2 5.8* 5.6* 5.3*  ALBUMIN 3.1* 2.5* 2.3* 2.1*    Recent Labs Lab 09/05/15 1540  LIPASE 24   No results for input(s): AMMONIA in the last 168 hours. CBC:  Recent Labs Lab 09/05/15 1540 09/06/15 0400 09/06/15 2047 09/08/15 0304 09/09/15 0212  WBC 14.1* 14.2* 12.3* 10.7* 10.5  HGB 14.0 12.2* 11.2* 10.8* 10.7*  HCT  41.5 38.6* 34.9* 33.8* 33.9*  MCV 94.3 96.3 95.6 95.5 95.5  PLT 233 249 224 226 260   Cardiac Enzymes: No results for input(s): CKTOTAL, CKMB, CKMBINDEX, TROPONINI in the last 168 hours. BNP (last 3 results) No results for input(s): BNP in the last 8760 hours.  ProBNP (last 3 results) No results for input(s): PROBNP in the last 8760 hours.  CBG: No results for input(s): GLUCAP in the last 168 hours.  Recent Results (from the past 240 hour(s))  Culture, blood (Routine X 2) w Reflex to ID Panel     Status: None (Preliminary result)   Collection Time: 09/06/15  8:34 PM  Result Value Ref Range Status   Specimen Description BLOOD LEFT HAND  Final   Special Requests   Final    BOTTLES DRAWN AEROBIC AND ANAEROBIC 8CC BLUE [place] RED   Culture NO GROWTH 2 DAYS  Final   Report Status PENDING  Incomplete  Culture, blood (Routine X 2) w Reflex to ID Panel     Status: None (Preliminary result)   Collection Time: 09/06/15  8:39 PM  Result Value Ref Range Status   Specimen Description BLOOD RIGHT HAND  Final   Special Requests BOTTLES DRAWN AEROBIC AND ANAEROBIC 10CC   Final   Culture NO GROWTH 2 DAYS  Final   Report Status PENDING  Incomplete  Culture, Urine     Status: None   Collection Time: 09/07/15 12:37 AM  Result Value Ref Range Status   Specimen Description URINE, RANDOM  Final   Special Requests NONE  Final   Culture NO GROWTH  Final   Report Status 09/08/2015 FINAL  Final     Studies: No results found.  Scheduled Meds: . aspirin EC  81 mg Oral Daily  . atenolol  25 mg Oral QHS  . atorvastatin  20 mg Oral q1800  . buPROPion  100 mg Oral BID  . cefTRIAXone (ROCEPHIN)  IV  1 g Intravenous Q24H  . coumadin book   Does not apply Once  . docusate sodium  100 mg Oral Daily  . gabapentin  300 mg Oral TID  . polyethylene glycol  17 g Oral Daily  . senna  1 tablet Oral BID  . Warfarin - Pharmacist Dosing Inpatient   Does not apply q1800   Continuous Infusions: . heparin 1,700  Units/hr (09/09/15 0057)    Active Problems:   Superior mesenteric vein thrombosis ([place])    Time spent: >35 minutes    Kinnie Feil  [facility] Pager [phone]. If 7PM-7AM, please contact night-coverage at [email or web address], password [facility] 09/09/2015, 8:54 AM  LOS: 4 days

## 2015-09-09 NOTE — Progress Notes (Signed)
Patient c/o pain across lower back above the belt line and abdominal area that was just as intense as it was at the time of admission. Notified on call [facility]. Patient was given pain meds and xanax. Patient resting comfortably. Will continue to monitor.

## 2015-09-09 NOTE — Progress Notes (Signed)
ANTICOAGULATION CONSULT NOTE - follow-up Consult  Pharmacy Consult for heparin + warfarin Indication: superior mesenteric vein thrombosis  Allergies  Allergen Reactions  . Lopressor [Metoprolol Tartrate] Hives    Patient Measurements: Height: 5\' 8"  (172.7 cm) Weight: 183 lb (83 kg) IBW/kg (Calculated) : 68.4 Heparin Dosing Weight: 83 kg  Vital Signs:   Labs:  Recent Labs  09/06/15 2047 09/07/15 0458  09/08/15 0304 09/08/15 1106 09/08/15 1634 09/09/15 0212  HGB 11.2*  --   --  10.8*  --   --  10.7*  HCT 34.9*  --   --  33.8*  --   --  33.9*  PLT 224  --   --  226  --   --  260  LABPROT  --  14.3  --  16.5*  --   --  23.3*  INR  --  1.11  --  1.32  --   --  2.04  HEPARINUNFRC  --   --   < > 0.21* 0.34 0.50 0.39  CREATININE  --  0.84  --  0.80  --   --   --   < > = values in this interval not displayed.  Estimated Creatinine Clearance: 97.9 mL/min (by C-G formula based on SCr of 0.8 mg/dL).   Assessment: 65 yo male on heparin + warfarin for mesenteric vein thrombosis. Not on anticoagulation PTA. INR jumped from 1.32>2.04. Will continue heparin for 1 more day for 2 days of a therapeutic INR prior to discharge and give 3 mg tonight as INR is expected to rise. Spoke to nurse and no bleeding or line issues were noted.    HL-0.39 on 1700 units/hr - Therapeutic X 3   Goal of Therapy:  INR 2-3 Monitor platelets by anticoagulation protocol: Yes   Plan:  - Continue heparin at 1700 units/hr - Warfarin 3 mg PO x 1 tonight  - Daily INR, HL, CBC, s/sx of bleeding   Jearld Fenton D Pharmacy Resident [phone] 09/09/2015 10:43 AM

## 2015-09-10 DIAGNOSIS — I81 Portal vein thrombosis: Principal | ICD-10-CM

## 2015-09-10 LAB — CBC
HEMATOCRIT: 37.1 % — AB (ref 39.0–52.0)
Hemoglobin: 11.6 g/dL — ABNORMAL LOW (ref 13.0–17.0)
MCH: 30.4 pg (ref 26.0–34.0)
MCHC: 31.3 g/dL (ref 30.0–36.0)
MCV: 97.1 fL (ref 78.0–100.0)
Platelets: 293 10*3/uL (ref 150–400)
RBC: 3.82 MIL/uL — AB (ref 4.22–5.81)
RDW: 13.9 % (ref 11.5–15.5)
WBC: 7.4 10*3/uL (ref 4.0–10.5)

## 2015-09-10 LAB — HEPARIN LEVEL (UNFRACTIONATED): Heparin Unfractionated: 0.46 IU/mL (ref 0.30–0.70)

## 2015-09-10 LAB — PROTIME-INR
INR: 2.63
Prothrombin Time: 28.6 seconds — ABNORMAL HIGH (ref 11.4–15.2)

## 2015-09-10 MED ORDER — WARFARIN SODIUM 3 MG PO TABS: 3.0000 mg | ORAL_TABLET | Freq: Every day | ORAL | 0 refills | Status: DC

## 2015-09-10 MED ORDER — LOPERAMIDE HCL 2 MG PO CAPS
2.0000 mg | ORAL_CAPSULE | ORAL | Status: DC | PRN
  Filled 2015-09-10 (×2): qty 1

## 2015-09-10 MED ORDER — WARFARIN SODIUM 2.5 MG PO TABS: 3.5000 mg | ORAL_TABLET | Freq: Once | ORAL | Status: DC

## 2015-09-10 NOTE — Progress Notes (Signed)
Patient is ready for discharge with family at bedside. Tele removed, IV removed, and AVS summary explained. Patient has no questions and is waiting for transportation services.

## 2015-09-10 NOTE — Progress Notes (Signed)
Patient wants prescriptions sent to [facility] in [place]. Address [address], phone number [phone], Pharmacist Trip Pruitt. Physician notified.

## 2015-09-10 NOTE — Progress Notes (Signed)
ANTICOAGULATION CONSULT NOTE - follow-up Consult  Pharmacy Consult for heparin + warfarin Indication: superior mesenteric vein thrombosis  Allergies  Allergen Reactions  . Lopressor [Metoprolol Tartrate] Hives    Patient Measurements: Height: 5\' 8"  (172.7 cm) Weight: 183 lb (83 kg) IBW/kg (Calculated) : 68.4 Heparin Dosing Weight: 83 kg  Vital Signs: Temp: 98 F (36.7 C) (08/14 0800) Temp Source: Oral (08/14 0800) BP: 103/55 (08/14 0800) Pulse Rate: 96 (08/14 0800) Labs:  Recent Labs  09/08/15 0304  09/08/15 1634 09/09/15 0212 09/10/15 0303 09/10/15 0304  HGB 10.8*  --   --  10.7* 11.6*  --   HCT 33.8*  --   --  33.9* 37.1*  --   PLT 226  --   --  260 293  --   LABPROT 16.5*  --   --  23.3*  --  28.6*  INR 1.32  --   --  2.04  --  2.63  HEPARINUNFRC 0.21*  < > 0.50 0.39  --  0.46  CREATININE 0.80  --   --   --   --   --   < > = values in this interval not displayed.  Estimated Creatinine Clearance: 97.9 mL/min (by C-G formula based on SCr of 0.8 mg/dL).   Assessment: 65 yo male on heparin + warfarin for mesenteric vein thrombosis. Not on anticoagulation PTA. INR now therapeutic x2, 2.04>2.63 (large jump over 2 days). Will give another reduced dose tonight and monitor trend. No bleed documented.   Goal of Therapy:  INR 2-3 Monitor platelets by anticoagulation protocol: Yes   Plan:  - D/c heparin as INR is therapeutic x2 - Warfarin 3.5mg  PO x 1 tonight  - Daily INR, HL, CBC - Monitor s/sx of bleeding   Elicia Lamp, PharmD, [facility] Clinical Pharmacist Pager [phone] 09/10/2015 10:44 AM

## 2015-09-10 NOTE — Discharge Summary (Signed)
Physician Discharge Summary  Larry Mullins [id number] DOB: [birth year] DOA: 09/05/2015  PCP: Glo Herring., MD  Admit date: 09/05/2015 Discharge date: 09/10/2015  Admitted From: [facility].  Disposition:  [facility].   Recommendations for Outpatient Follow-up:  1. Follow up with PCP in 1-2 weeks 2. Please obtain BMP/CBC in one week 3. Please follow up with INR on Wednesday.  4. Follow up with orthopedics and GI.   Home Health:yes,   Discharge Condition:stable.  CODE STATUS: full code.  Diet recommendation: Heart Healthy  Brief/Interim Summary: 65 y/o HTN, HPL, CAD h/o CABG, Alcohol Abuse (quit 5 years ago), presented with sudden onset of abdominal pains 1 week ago, associated with nausea, worsening pain with food. 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 Dr. Maudie Mercury d/w vascular surgery who recommended heparin/coumadin bridging, no surgical intervention.  Discharge Diagnoses:  Active Problems:   Superior mesenteric vein thrombosis (HCC)  Superior mesenteric vein thrombosis with associated ascites and mesenteric stranding -started/cont on iv heparin/coumadin bridging per vascular surgery recommendations. INR 2.0. Resume 3 mg of coumadin.   CAD h/o CABG. No acute chest pains. Cont BB, statin, ASA  Fever. Unclear etiology. CXR: no clear infiltrates. Received iv ceftriaxone (8/10-8/13) for probable UTI, blood/urine cultures: NGTD. Afebrile 48 hrs. D/c atx   Questionable liver cirrhosis. H/o heavy alcohol use. Will need outpatient follow up with GI   Avascular necrosis of left femoral head. No tenderness on palpation, denies acute pains. F/u with ortho as outpatient    Discharge Instructions  Discharge Instructions    Diet - low sodium heart healthy    Complete by:  As directed   Discharge instructions    Complete by:  As directed   Please check INR  on Wednesday , keep INR between 2 to 3 .  Follow up with PCP on Wednesday.       Medication List    TAKE these medications   alprazolam 2 MG tablet Commonly known as:  XANAX Take 1 tablet by mouth 4 (four) times daily.   aspirin EC 81 MG tablet Take 81 mg by mouth daily.   atenolol 25 MG tablet Commonly known as:  TENORMIN Take 25 mg by mouth at bedtime.   atorvastatin 20 MG tablet Commonly known as:  LIPITOR Take 1 tablet by mouth daily.   buPROPion 100 MG tablet Commonly known as:  WELLBUTRIN Take 1 tablet by mouth 2 (two) times daily.   gabapentin 300 MG capsule Commonly known as:  NEURONTIN Take 1 capsule by mouth 3 (three) times daily.   HYDROcodone-acetaminophen 10-325 MG tablet Commonly known as:  NORCO Take 1 tablet by mouth 4 (four) times daily as needed for moderate pain.   traMADol 50 MG tablet Commonly known as:  ULTRAM Take 1 tablet by mouth 4 (four) times daily as needed for moderate pain.   warfarin 3 MG tablet Commonly known as:  COUMADIN Take 1 tablet (3 mg total) by mouth daily at 6 PM.   zolpidem 5 MG tablet Commonly known as:  AMBIEN Take 5 mg by mouth at bedtime as needed for sleep.      Follow-up Information    Glo Herring., MD Follow up on 09/12/2015.   Specialty:  Internal Medicine Why:  needs INR check on wednesday. Contact information: [address] [place] [id number] [phone]          Allergies  Allergen Reactions  . Lopressor [  Metoprolol Tartrate] Hives    Consultations: Vascular surgery.   Procedures/Studies: Dg Chest 2 View  Result Date: 09/06/2015 CLINICAL DATA:  Hypertension. EXAM: CHEST  2 VIEW COMPARISON:  No recent prior. FINDINGS: Prior CABG. Heart size normal. No focal infiltrate. No pleural effusion or pneumothorax. No acute bony abnormality . IMPRESSION: 1. Prior CABG. 2.  No acute pulmonary disease. Electronically Signed   By: Marcello Moores  Register   On: 09/06/2015 09:25   Ct Abdomen Pelvis  W Contrast  Result Date: 09/05/2015 CLINICAL DATA:  No oral contrast per MD order; upper abdominal pain x 1 week with nausea; no other complaints^162mL ISOVUE-300 IOPAMIDOL (ISOVUE-300) INJECTION 61% EXAM: CT ABDOMEN AND PELVIS WITH CONTRAST TECHNIQUE: Multidetector CT imaging of the abdomen and pelvis was performed using the standard protocol following bolus administration of intravenous contrast. CONTRAST:  132mL ISOVUE-300 IOPAMIDOL (ISOVUE-300) INJECTION 61% COMPARISON:  None. FINDINGS: Lower chest: Mild bibasilar atelectasis. Status post median sternotomy. Coronary artery calcification and changes of CABG. Heart is normal in size. Hepatobiliary: The liver is homogeneous. Rounded margins of the liver, prominent caudate lobe raise the question of cirrhosis. Gallbladder is normal in CT appearance. Pancreas: Pancreas is normal in appearance. Spleen: Normal in appearance. Renal/Adrenal: Adrenal glands are normal in appearance. Kidneys show symmetric bilateral enhancement. No hydronephrosis or renal mass. Gastrointestinal tract: The stomach has a normal appearance. There is significant stranding of the small bowel mesentery. Bowel wall appears normal with no evidence for obstruction. Colonic loops are notable for significant diverticulosis but no acute inflammatory changes. The appendix is well seen and has a normal appearance. Reproductive/Pelvis: The urinary bladder, prostate, and seminal vesicles are normal in appearance. Small amount of free pelvic fluid is present. Vascular/Lymphatic: There is acute thrombus of the superior mesenteric vein, associated significant stranding of the mesentery. The portal vein appears patent. The superior mesenteric artery, celiac axis, and inferior mesenteric artery are normally opacified. There is atherosclerotic calcification of the abdominal aorta not associated with aneurysm or dissection. Musculoskeletal/Abdominal wall: Small supraumbilical fat containing midline hernia.  Visualized osseous structures have a normal appearance. There is significant collapse and sclerosis of the left femoral head consistent with avascular necrosis. Other: none IMPRESSION: 1. Acute superior mesenteric vein thrombosis with associated ascites and mesenteric stranding. 2. Question of cirrhosis. 3. No focal liver lesions. 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 5. Small fat containing anterior abdominal wall hernia. Critical Value/emergent results were called by telephone at the time of interpretation on 09/05/2015 at 6:15 pm to Dr. Isla Pence , who verbally acknowledged these results. 6. Changes following CABG.  Confirm Electronically Signed   By: Nolon Nations M.D.   On: 09/05/2015 18:20       Subjective: No complaints.   Discharge Exam: Vitals:   09/10/15 0512 09/10/15 0800  BP: 104/60 (!) 103/55  Pulse: (!) 58 96  Resp: 18 18  Temp: 97.6 F (36.4 C) 98 F (36.7 C)   Vitals:   09/09/15 1316 09/09/15 2109 09/10/15 0512 09/10/15 0800  BP: (!) 108/57 (!) 101/58 104/60 (!) 103/55  Pulse: 65 62 (!) 58 96  Resp: 18 18 18 18   Temp: 98.3 F (36.8 C) 97.5 F (36.4 C) 97.6 F (36.4 C) 98 F (36.7 C)  TempSrc: Oral Oral Oral Oral  SpO2: 96% 99% 97% 96%  Weight:      Height:        General: Pt is alert, awake, not in acute distress Cardiovascular: RRR, S1/S2 +, no rubs, no gallops  Respiratory: CTA bilaterally, no wheezing, no rhonchi Abdominal: Soft, NT, ND, bowel sounds + Extremities: no edema, no cyanosis    The results of significant diagnostics from this hospitalization (including imaging, microbiology, ancillary and laboratory) are listed below for reference.     Microbiology: Recent Results (from the past 240 hour(s))  Culture, blood (Routine X 2) w Reflex to ID Panel     Status: None (Preliminary result)   Collection Time: 09/06/15  8:34 PM  Result Value Ref Range Status   Specimen Description BLOOD LEFT HAND  Final   Special Requests   Final     BOTTLES DRAWN AEROBIC AND ANAEROBIC 8CC BLUE [place] RED   Culture NO GROWTH 3 DAYS  Final   Report Status PENDING  Incomplete  Culture, blood (Routine X 2) w Reflex to ID Panel     Status: None (Preliminary result)   Collection Time: 09/06/15  8:39 PM  Result Value Ref Range Status   Specimen Description BLOOD RIGHT HAND  Final   Special Requests BOTTLES DRAWN AEROBIC AND ANAEROBIC 10CC   Final   Culture NO GROWTH 3 DAYS  Final   Report Status PENDING  Incomplete  Culture, Urine     Status: None   Collection Time: 09/07/15 12:37 AM  Result Value Ref Range Status   Specimen Description URINE, RANDOM  Final   Special Requests NONE  Final   Culture NO GROWTH  Final   Report Status 09/08/2015 FINAL  Final     Labs: BNP (last 3 results) No results for input(s): BNP in the last 8760 hours. Basic Metabolic Panel:  Recent Labs Lab 09/05/15 1540 09/05/15 2152 09/06/15 0400 09/07/15 0458 09/08/15 0304  NA 133*  --  138 139 136  K 3.9  --  4.6 3.7 3.6  CL 101  --  101 102 100*  CO2 27  --  29 28 30   GLUCOSE 140*  --  146* 124* 150*  BUN 11  --  8 8 7   CREATININE 0.85  --  1.01 0.84 0.80  CALCIUM 8.1*  --  8.3* 7.9* 7.7*  MG  --  1.9  --   --   --   PHOS  --  3.4  --   --   --    Liver Function Tests:  Recent Labs Lab 09/05/15 1540 09/06/15 0400 09/07/15 0458 09/08/15 0304  AST 28 28 26 20   ALT 54 44 35 28  ALKPHOS 127* 120 124 112  BILITOT 1.4* 2.2* 0.9 0.6  PROT 7.2 5.8* 5.6* 5.3*  ALBUMIN 3.1* 2.5* 2.3* 2.1*    Recent Labs Lab 09/05/15 1540  LIPASE 24   No results for input(s): AMMONIA in the last 168 hours. CBC:  Recent Labs Lab 09/06/15 0400 09/06/15 2047 09/08/15 0304 09/09/15 0212 09/10/15 0303  WBC 14.2* 12.3* 10.7* 10.5 7.4  HGB 12.2* 11.2* 10.8* 10.7* 11.6*  HCT 38.6* 34.9* 33.8* 33.9* 37.1*  MCV 96.3 95.6 95.5 95.5 97.1  PLT 249 224 226 260 293   Cardiac Enzymes: No results for input(s): CKTOTAL, CKMB, CKMBINDEX, TROPONINI in the last 168  hours. BNP: Invalid input(s): POCBNP CBG: No results for input(s): GLUCAP in the last 168 hours. D-Dimer No results for input(s): DDIMER in the last 72 hours. Hgb A1c No results for input(s): HGBA1C in the last 72 hours. Lipid Profile No results for input(s): CHOL, HDL, LDLCALC, TRIG, CHOLHDL, LDLDIRECT in the last 72 hours. Thyroid function studies No results for input(s): TSH, T4TOTAL,  T3FREE, THYROIDAB in the last 72 hours.  Invalid input(s): FREET3 Anemia work up No results for input(s): VITAMINB12, FOLATE, FERRITIN, TIBC, IRON, RETICCTPCT in the last 72 hours. Urinalysis    Component Value Date/Time   COLORURINE YELLOW 09/05/2015 [place] 09/05/2015 1530   LABSPEC 1.010 09/05/2015 1530   PHURINE 5.5 09/05/2015 1530   GLUCOSEU NEGATIVE 09/05/2015 1530   HGBUR NEGATIVE 09/05/2015 1530   BILIRUBINUR NEGATIVE 09/05/2015 1530   KETONESUR TRACE (A) 09/05/2015 1530   PROTEINUR NEGATIVE 09/05/2015 1530   NITRITE POSITIVE (A) 09/05/2015 1530   LEUKOCYTESUR NEGATIVE 09/05/2015 1530   Sepsis Labs Invalid input(s): PROCALCITONIN,  WBC,  LACTICIDVEN Microbiology Recent Results (from the past 240 hour(s))  Culture, blood (Routine X 2) w Reflex to ID Panel     Status: None (Preliminary result)   Collection Time: 09/06/15  8:34 PM  Result Value Ref Range Status   Specimen Description BLOOD LEFT HAND  Final   Special Requests   Final    BOTTLES DRAWN AEROBIC AND ANAEROBIC 8CC BLUE [place] RED   Culture NO GROWTH 3 DAYS  Final   Report Status PENDING  Incomplete  Culture, blood (Routine X 2) w Reflex to ID Panel     Status: None (Preliminary result)   Collection Time: 09/06/15  8:39 PM  Result Value Ref Range Status   Specimen Description BLOOD RIGHT HAND  Final   Special Requests BOTTLES DRAWN AEROBIC AND ANAEROBIC 10CC   Final   Culture NO GROWTH 3 DAYS  Final   Report Status PENDING  Incomplete  Culture, Urine     Status: None   Collection Time: 09/07/15 12:37  AM  Result Value Ref Range Status   Specimen Description URINE, RANDOM  Final   Special Requests NONE  Final   Culture NO GROWTH  Final   Report Status 09/08/2015 FINAL  Final     Time coordinating discharge: Over 30 minutes  SIGNED:   Hosie Poisson, MD  [facility] 09/10/2015, 11:35 AM Pager   If 7PM-7AM, please contact night-coverage [email or web address] Password [email or web address]

## 2015-09-11 LAB — CULTURE, BLOOD (ROUTINE X 2)
Culture: NO GROWTH
Culture: NO GROWTH

## 2015-09-12 DIAGNOSIS — Z7901 Long term (current) use of anticoagulants: Secondary | ICD-10-CM | POA: Diagnosis not present

## 2015-09-14 DIAGNOSIS — Z6829 Body mass index (BMI) 29.0-29.9, adult: Secondary | ICD-10-CM | POA: Diagnosis not present

## 2015-09-14 DIAGNOSIS — G894 Chronic pain syndrome: Secondary | ICD-10-CM | POA: Diagnosis not present

## 2015-09-14 DIAGNOSIS — K55012 Diffuse acute (reversible) ischemia of small intestine: Secondary | ICD-10-CM | POA: Diagnosis not present

## 2015-09-14 DIAGNOSIS — K7030 Alcoholic cirrhosis of liver without ascites: Secondary | ICD-10-CM | POA: Diagnosis not present

## 2016-03-10 DIAGNOSIS — F330 Major depressive disorder, recurrent, mild: Secondary | ICD-10-CM | POA: Diagnosis not present

## 2016-03-10 DIAGNOSIS — Z1389 Encounter for screening for other disorder: Secondary | ICD-10-CM | POA: Diagnosis not present

## 2016-03-10 DIAGNOSIS — Z6829 Body mass index (BMI) 29.0-29.9, adult: Secondary | ICD-10-CM | POA: Diagnosis not present

## 2016-03-10 DIAGNOSIS — G894 Chronic pain syndrome: Secondary | ICD-10-CM | POA: Diagnosis not present

## 2016-03-10 DIAGNOSIS — F329 Major depressive disorder, single episode, unspecified: Secondary | ICD-10-CM | POA: Diagnosis not present

## 2016-03-10 DIAGNOSIS — M1991 Primary osteoarthritis, unspecified site: Secondary | ICD-10-CM | POA: Diagnosis not present

## 2016-03-17 ENCOUNTER — Ambulatory Visit: Payer: Medicare Other | Admitting: Neurology

## 2016-06-02 DIAGNOSIS — M1991 Primary osteoarthritis, unspecified site: Secondary | ICD-10-CM | POA: Diagnosis not present

## 2016-06-02 DIAGNOSIS — Z6828 Body mass index (BMI) 28.0-28.9, adult: Secondary | ICD-10-CM | POA: Diagnosis not present

## 2016-06-02 DIAGNOSIS — I10 Essential (primary) hypertension: Secondary | ICD-10-CM | POA: Diagnosis not present

## 2016-06-02 DIAGNOSIS — F419 Anxiety disorder, unspecified: Secondary | ICD-10-CM | POA: Diagnosis not present

## 2016-06-02 DIAGNOSIS — G894 Chronic pain syndrome: Secondary | ICD-10-CM | POA: Diagnosis not present

## 2016-11-28 DIAGNOSIS — F0390 Unspecified dementia without behavioral disturbance: Secondary | ICD-10-CM | POA: Diagnosis not present

## 2016-11-28 DIAGNOSIS — Z125 Encounter for screening for malignant neoplasm of prostate: Secondary | ICD-10-CM | POA: Diagnosis not present

## 2016-11-28 DIAGNOSIS — G9009 Other idiopathic peripheral autonomic neuropathy: Secondary | ICD-10-CM | POA: Diagnosis not present

## 2016-11-28 DIAGNOSIS — F330 Major depressive disorder, recurrent, mild: Secondary | ICD-10-CM | POA: Diagnosis not present

## 2016-11-28 DIAGNOSIS — Z1389 Encounter for screening for other disorder: Secondary | ICD-10-CM | POA: Diagnosis not present

## 2016-11-28 DIAGNOSIS — R5383 Other fatigue: Secondary | ICD-10-CM | POA: Diagnosis not present

## 2016-11-28 DIAGNOSIS — M109 Gout, unspecified: Secondary | ICD-10-CM | POA: Diagnosis not present

## 2016-11-28 DIAGNOSIS — I10 Essential (primary) hypertension: Secondary | ICD-10-CM | POA: Diagnosis not present

## 2017-01-09 DIAGNOSIS — F0390 Unspecified dementia without behavioral disturbance: Secondary | ICD-10-CM | POA: Diagnosis not present

## 2017-01-09 DIAGNOSIS — F419 Anxiety disorder, unspecified: Secondary | ICD-10-CM | POA: Diagnosis not present

## 2017-01-09 DIAGNOSIS — G4700 Insomnia, unspecified: Secondary | ICD-10-CM | POA: Diagnosis not present

## 2017-01-09 DIAGNOSIS — I10 Essential (primary) hypertension: Secondary | ICD-10-CM | POA: Diagnosis not present

## 2017-01-23 ENCOUNTER — Other Ambulatory Visit: Payer: Self-pay

## 2017-01-23 ENCOUNTER — Encounter: Payer: Self-pay | Admitting: Gastroenterology

## 2017-01-23 ENCOUNTER — Ambulatory Visit (INDEPENDENT_AMBULATORY_CARE_PROVIDER_SITE_OTHER): Payer: Medicare Other | Admitting: Gastroenterology

## 2017-01-23 VITALS — BP 121/70 | HR 58 | Temp 97.0°F | Ht 68.0 in | Wt 179.6 lb

## 2017-01-23 DIAGNOSIS — K625 Hemorrhage of anus and rectum: Secondary | ICD-10-CM

## 2017-01-23 DIAGNOSIS — IMO0002 Reserved for concepts with insufficient information to code with codable children: Principal | ICD-10-CM

## 2017-01-23 DIAGNOSIS — R932 Abnormal findings on diagnostic imaging of liver and biliary tract: Secondary | ICD-10-CM | POA: Insufficient documentation

## 2017-01-23 DIAGNOSIS — K7469 Other cirrhosis of liver: Secondary | ICD-10-CM | POA: Diagnosis not present

## 2017-01-23 DIAGNOSIS — K55069 Acute infarction of intestine, part and extent unspecified: Secondary | ICD-10-CM

## 2017-01-23 NOTE — Patient Instructions (Signed)
Please have blood work done today. I have also ordered an ultrasound of your liver for further evaluation due to concern for cirrhosis last year.   I am referring you to a cardiologist just to establish care.  We will see you back in February and arrange procedures at that time.   Have a wonderful New Year!

## 2017-01-23 NOTE — Progress Notes (Signed)
Primary Care Physician:  Redmond School, MD Primary Gastroenterologist:  Dr. Gala Romney   Chief Complaint  Patient presents with  . Rectal Bleeding  . Constipation  . Diarrhea  . Rectal Pain    x1    HPI:   Larry Mullins is a 66 y.o. male presenting today at the request of Redmond School, MD secondary to need for initial screening colonoscopy. In Aug 2017, he was hospitalized with acute superior mesenteric vein thrombosis with associated ascites, question of cirrhosis on imaging. Outside labs from Nov 2018 with Hgb 15.5, platelets 230, albumin 4.5, Tbili 0.4, alk phos 84, AST 20, ALT 14. No prior colonoscopy. No prior endoscopy. He and his wife state they were not aware of possible cirrhosis on imaging.   No abdominal pain. Just diagnosed with dementia 2 weeks ago by Dr. Merlene Laughter. Had a few episodes of rectal bleeding and rectal pain over the past few months. Does better if avoids red meat. BMs are better if avoiding red meat. Sometimes will cough when eating, feeling like something is stuck in back of his throat. No esophageal dysphagia. Wife states he lost from 220 down to 180s due to change in diet and purposeful changes with eating behaviors. Loves shopping at [employer] for veggies. On chronic anticoagulation.   Past Medical History:  Diagnosis Date  . Avascular necrosis of bone of hip, left ([place])   . Dementia   . Hypertension   . Hypertension   . Superior mesenteric vein thrombosis     Past Surgical History:  Procedure Laterality Date  . 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 no stents per patient    Current Outpatient Medications  Medication Sig Dispense Refill  . alprazolam (XANAX) 2 MG tablet Take 1 tablet by mouth 4 (four) times daily.    [name] aspirin EC 81 MG tablet Take 81 mg by mouth daily.    [name] atenolol (TENORMIN) 25 MG tablet Take 25 mg by mouth at bedtime.    [name] atorvastatin (LIPITOR) 20 MG tablet Take 10 mg by mouth daily.     [name] buPROPion  (WELLBUTRIN) 100 MG tablet Take 0.5 tablets by mouth 2 (two) times daily.     [name] gabapentin (NEURONTIN) 300 MG capsule Take 100 mg by mouth 3 (three) times daily.     [name] HYDROcodone-acetaminophen (NORCO) 10-325 MG tablet Take 1 tablet by mouth 4 (four) times daily as needed for moderate pain.     . memantine (NAMENDA) 5 MG tablet Take 1 tablet by mouth 2 (two) times daily.    . traMADol (ULTRAM) 50 MG tablet Take 1 tablet by mouth 4 (four) times daily.     . traZODone (DESYREL) 50 MG tablet Take 50-100 mg by mouth at bedtime.    [name] warfarin (COUMADIN) 3 MG tablet Take 1 tablet (3 mg total) by mouth daily at 6 PM. 30 tablet 0   No current facility-administered medications for this visit.     Allergies as of 01/23/2017 - Review Complete 01/23/2017  Allergen Reaction Noted  . Lopressor [metoprolol tartrate] Hives 09/05/2015    Family History  Problem Relation Age of Onset  . Colon polyps Sister        unsure age of surgery, had to have colon resection   . Colon cancer Neg Hx   . Liver disease Neg Hx     Social History   Socioeconomic History  . Marital status: Married    Spouse name: Not  on file  . Number of children: Not on file  . Years of education: Not on file  . Highest education level: Not on file  Social Needs  . Financial resource strain: Not on file  . Food insecurity - worry: Not on file  . Food insecurity - inability: Not on file  . Transportation needs - medical: Not on file  . Transportation needs - non-medical: Not on file  Occupational History  . Not on file  Tobacco Use  . Smoking status: Former Smoker    Types: Cigarettes  . Smokeless tobacco: Former Systems developer    Quit date: 1999  Substance and Sexual Activity  . 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 Sexual activity: Not on file  Other Topics Concern  . Not on file    Social History Narrative  . Not on file    Review of Systems: Gen: Denies any fever, chills, fatigue, weight loss, lack of appetite.  CV: Denies chest pain, heart palpitations, peripheral edema, syncope.  Resp: Denies shortness of breath at rest or with exertion. Denies wheezing or cough.  GI: see HPI  GU : Denies urinary burning, urinary frequency, urinary hesitancy MS: +hip pain  Derm: Denies rash, itching, dry skin Psych: see HPI  Heme: see HPI   Physical Exam: BP 121/70   Pulse (!) 58   Temp (!) 97 F (36.1 C) (Oral)   Ht _0  (1.727 m)   Wt 179 lb 9.6 oz (81.5 kg)   BMI 27.31 kg/m  General:   Alert and oriented. Pleasant and cooperative. Well-nourished and well-developed.  Head:  Normocephalic and atraumatic. Eyes:  Without icterus, sclera clear and conjunctiva pink.  Ears:  Normal auditory acuity. Nose:  No deformity, discharge,  or lesions. Mouth:  No deformity or lesions, oral mucosa pink.  Lungs:  Clear to auscultation bilaterally. No wheezes, rales, or rhonchi. No distress.  Heart:  S1, S2 present without murmurs appreciated.  Abdomen:  +BS, soft, non-tender and non-distended. No HSM noted. No guarding or rebound. No masses appreciated. Small umbilical hernia likely.  Rectal:  Deferred  Msk:  Symmetrical without gross deformities. Normal posture. Extremities:  Without  edema. Neurologic:  Alert and  oriented x4 Psych:  Alert and cooperative. Normal mood and affect.

## 2017-01-24 LAB — HEPATITIS B SURFACE ANTIBODY,QUALITATIVE: HEP B SURFACE AB, QUAL: NONREACTIVE

## 2017-01-24 LAB — HEPATITIS A ANTIBODY, TOTAL: HEP A TOTAL AB: NEGATIVE

## 2017-01-24 LAB — HEPATITIS C ANTIBODY

## 2017-01-24 LAB — HEPATITIS B SURFACE ANTIGEN: Hepatitis B Surface Ag: NEGATIVE

## 2017-01-24 LAB — HEPATITIS B CORE ANTIBODY, TOTAL: HEP B C TOTAL AB: NEGATIVE

## 2017-01-25 NOTE — Assessment & Plan Note (Signed)
66 year old male with several episodes of rectal discomfort and low-volume hematochezia, likely benign anorectal source. No prior colonoscopy. He does describe a sister with an advanced polyp requiring what sounds like a colon resection. On chronic coumadin with history of superior mesenteric vein thrombosis in Aug 2017. Outside labs from Nov 2018 with normal Hgb. Will need colonoscopy in the near future for diagnostic purposes, but we will have to see about holding coumadin vs need for bridging lovenox. Also, he notes a history of a CABG in remote past but states he has not seen a cardiologist since that time. Would like to have him established with cardiology and evaluated prior to undergoing an elective procedure. Referral to cardiology and follow-up in February 2019. As of note, patient and wife both wanted to hold off on elective procedures until February 2019 for financial reasons.

## 2017-01-25 NOTE — Assessment & Plan Note (Signed)
Findings of possible cirrhosis on imaging during hospitalization Aug 2017. US abdomen complete ordered. Also ordered Hep B serologies, Hep A, and Hep C. Recent LFTs from outside labs are normal, so I will hold off on extensive serologies unless any bumps in LFTs. Likely cirrhotic findings are secondary to prior ETOH abuse, but he has been sober since 2012 and was applauded on that.

## 2017-01-26 NOTE — Progress Notes (Signed)
cc'ed to pcp °

## 2017-01-30 ENCOUNTER — Ambulatory Visit (HOSPITAL_COMMUNITY)
Admission: RE | Admit: 2017-01-30 | Discharge: 2017-01-30 | Disposition: A | Discharge status: Home / Self Care | Payer: Medicare Other | Source: Ambulatory Visit | Attending: Gastroenterology | Admitting: Gastroenterology

## 2017-01-30 DIAGNOSIS — K7460 Unspecified cirrhosis of liver: Secondary | ICD-10-CM | POA: Insufficient documentation

## 2017-01-30 DIAGNOSIS — K7469 Other cirrhosis of liver: Secondary | ICD-10-CM

## 2017-02-02 NOTE — Progress Notes (Signed)
Hep C negative.  Negative for hepatitis B. He will need Hep A/B vaccinations. We can discuss this at next appt. His ultrasound showed cirrhosis, no concerning findings. Will be seeing him in follow-up in future to arrange procedures.

## 2017-02-06 ENCOUNTER — Emergency Department (HOSPITAL_COMMUNITY): Payer: Medicare Other

## 2017-02-06 ENCOUNTER — Encounter (HOSPITAL_COMMUNITY): Payer: Self-pay | Admitting: Emergency Medicine

## 2017-02-06 ENCOUNTER — Other Ambulatory Visit: Payer: Self-pay

## 2017-02-06 ENCOUNTER — Observation Stay (HOSPITAL_COMMUNITY)
Admission: EM | Admit: 2017-02-06 | Discharge: 2017-02-12 | Disposition: A | Discharge status: Home / Self Care | Payer: Medicare Other | Attending: Internal Medicine | Admitting: Internal Medicine

## 2017-02-06 DIAGNOSIS — K2950 Unspecified chronic gastritis without bleeding: Secondary | ICD-10-CM | POA: Diagnosis not present

## 2017-02-06 DIAGNOSIS — Z951 Presence of aortocoronary bypass graft: Secondary | ICD-10-CM | POA: Insufficient documentation

## 2017-02-06 DIAGNOSIS — I2510 Atherosclerotic heart disease of native coronary artery without angina pectoris: Secondary | ICD-10-CM | POA: Diagnosis not present

## 2017-02-06 DIAGNOSIS — Z79891 Long term (current) use of opiate analgesic: Secondary | ICD-10-CM | POA: Insufficient documentation

## 2017-02-06 DIAGNOSIS — F0390 Unspecified dementia without behavioral disturbance: Secondary | ICD-10-CM | POA: Diagnosis not present

## 2017-02-06 DIAGNOSIS — K921 Melena: Secondary | ICD-10-CM | POA: Diagnosis not present

## 2017-02-06 DIAGNOSIS — K922 Gastrointestinal hemorrhage, unspecified: Secondary | ICD-10-CM | POA: Diagnosis not present

## 2017-02-06 DIAGNOSIS — K7460 Unspecified cirrhosis of liver: Secondary | ICD-10-CM | POA: Insufficient documentation

## 2017-02-06 DIAGNOSIS — I81 Portal vein thrombosis: Secondary | ICD-10-CM | POA: Insufficient documentation

## 2017-02-06 DIAGNOSIS — B9681 Helicobacter pylori [H. pylori] as the cause of diseases classified elsewhere: Secondary | ICD-10-CM | POA: Diagnosis not present

## 2017-02-06 DIAGNOSIS — K648 Other hemorrhoids: Secondary | ICD-10-CM | POA: Diagnosis not present

## 2017-02-06 DIAGNOSIS — Z7982 Long term (current) use of aspirin: Secondary | ICD-10-CM | POA: Insufficient documentation

## 2017-02-06 DIAGNOSIS — Z79899 Other long term (current) drug therapy: Secondary | ICD-10-CM | POA: Diagnosis not present

## 2017-02-06 DIAGNOSIS — N200 Calculus of kidney: Secondary | ICD-10-CM | POA: Diagnosis not present

## 2017-02-06 DIAGNOSIS — I951 Orthostatic hypotension: Secondary | ICD-10-CM | POA: Diagnosis not present

## 2017-02-06 DIAGNOSIS — R001 Bradycardia, unspecified: Secondary | ICD-10-CM | POA: Diagnosis not present

## 2017-02-06 DIAGNOSIS — K5730 Diverticulosis of large intestine without perforation or abscess without bleeding: Secondary | ICD-10-CM | POA: Diagnosis not present

## 2017-02-06 DIAGNOSIS — Z7901 Long term (current) use of anticoagulants: Secondary | ICD-10-CM | POA: Insufficient documentation

## 2017-02-06 DIAGNOSIS — R531 Weakness: Secondary | ICD-10-CM

## 2017-02-06 DIAGNOSIS — Z87891 Personal history of nicotine dependence: Secondary | ICD-10-CM | POA: Diagnosis not present

## 2017-02-06 DIAGNOSIS — K644 Residual hemorrhoidal skin tags: Secondary | ICD-10-CM | POA: Diagnosis not present

## 2017-02-06 DIAGNOSIS — IMO0002 Reserved for concepts with insufficient information to code with codable children: Secondary | ICD-10-CM

## 2017-02-06 DIAGNOSIS — K602 Anal fissure, unspecified: Secondary | ICD-10-CM | POA: Diagnosis not present

## 2017-02-06 DIAGNOSIS — K55069 Acute infarction of intestine, part and extent unspecified: Secondary | ICD-10-CM | POA: Diagnosis present

## 2017-02-06 DIAGNOSIS — R1310 Dysphagia, unspecified: Secondary | ICD-10-CM

## 2017-02-06 DIAGNOSIS — I10 Essential (primary) hypertension: Secondary | ICD-10-CM | POA: Diagnosis not present

## 2017-02-06 DIAGNOSIS — K2930 Chronic superficial gastritis without bleeding: Secondary | ICD-10-CM

## 2017-02-06 DIAGNOSIS — K222 Esophageal obstruction: Secondary | ICD-10-CM | POA: Diagnosis not present

## 2017-02-06 DIAGNOSIS — K649 Unspecified hemorrhoids: Secondary | ICD-10-CM

## 2017-02-06 HISTORY — DX: Diverticulosis of intestine, part unspecified, without perforation or abscess without bleeding: K57.90

## 2017-02-06 HISTORY — DX: Hyperlipidemia, unspecified: E78.5

## 2017-02-06 HISTORY — DX: Atherosclerotic heart disease of native coronary artery without angina pectoris: I25.10

## 2017-02-06 LAB — CBC
HCT: 45.5 % (ref 39.0–52.0)
Hemoglobin: 14.8 g/dL (ref 13.0–17.0)
MCH: 31.2 pg (ref 26.0–34.0)
MCHC: 32.5 g/dL (ref 30.0–36.0)
MCV: 96 fL (ref 78.0–100.0)
Platelets: 215 10*3/uL (ref 150–400)
RBC: 4.74 MIL/uL (ref 4.22–5.81)
RDW: 13.4 % (ref 11.5–15.5)
WBC: 7.4 10*3/uL (ref 4.0–10.5)

## 2017-02-06 LAB — COMPREHENSIVE METABOLIC PANEL
ALBUMIN: 3.8 g/dL (ref 3.5–5.0)
ALT: 26 U/L (ref 17–63)
AST: 31 U/L (ref 15–41)
Alkaline Phosphatase: 79 U/L (ref 38–126)
Anion gap: 9 (ref 5–15)
BILIRUBIN TOTAL: 0.4 mg/dL (ref 0.3–1.2)
BUN: 15 mg/dL (ref 6–20)
CO2: 23 mmol/L (ref 22–32)
CREATININE: 0.93 mg/dL (ref 0.61–1.24)
Calcium: 8.9 mg/dL (ref 8.9–10.3)
Chloride: 104 mmol/L (ref 101–111)
GFR calc Af Amer: >60 mL/min (ref >60)
GFR calc non Af Amer: >60 mL/min (ref >60)
GLUCOSE: 162 mg/dL — AB (ref 65–99)
POTASSIUM: 4.1 mmol/L (ref 3.5–5.1)
Sodium: 136 mmol/L (ref 135–145)
TOTAL PROTEIN: 7 g/dL (ref 6.5–8.1)

## 2017-02-06 LAB — CBC WITH DIFFERENTIAL/PLATELET
BASOS ABS: 0 10*3/uL (ref 0.0–0.1)
Basophils Relative: 0 %
EOS PCT: 2 %
Eosinophils Absolute: 0.2 10*3/uL (ref 0.0–0.7)
HEMATOCRIT: 45.4 % (ref 39.0–52.0)
Hemoglobin: 14.8 g/dL (ref 13.0–17.0)
LYMPHS ABS: 1.6 10*3/uL (ref 0.7–4.0)
LYMPHS PCT: 17 %
MCH: 31.3 pg (ref 26.0–34.0)
MCHC: 32.6 g/dL (ref 30.0–36.0)
MCV: 96 fL (ref 78.0–100.0)
Monocytes Absolute: 0.9 10*3/uL (ref 0.1–1.0)
Monocytes Relative: 10 %
NEUTROS PCT: 71 %
Neutro Abs: 6.5 10*3/uL (ref 1.7–7.7)
PLATELETS: 215 10*3/uL (ref 150–400)
RBC: 4.73 MIL/uL (ref 4.22–5.81)
RDW: 13.5 % (ref 11.5–15.5)
WBC: 9.1 10*3/uL (ref 4.0–10.5)

## 2017-02-06 LAB — URINALYSIS, ROUTINE W REFLEX MICROSCOPIC
Bilirubin Urine: NEGATIVE
GLUCOSE, UA: NEGATIVE mg/dL
Hgb urine dipstick: NEGATIVE
Ketones, ur: NEGATIVE mg/dL
LEUKOCYTES UA: NEGATIVE
Nitrite: NEGATIVE
PH: 7 (ref 5.0–8.0)
Protein, ur: NEGATIVE mg/dL
Specific Gravity, Urine: 1.009 (ref 1.005–1.030)

## 2017-02-06 LAB — TYPE AND SCREEN
ABO/RH(D): O NEG
Antibody Screen: NEGATIVE

## 2017-02-06 LAB — POC OCCULT BLOOD, ED: Fecal Occult Bld: POSITIVE — AB

## 2017-02-06 LAB — TROPONIN I
Troponin I: <0.03 ng/mL (ref <0.03)
Troponin I: <0.03 ng/mL (ref <0.03)

## 2017-02-06 LAB — MAGNESIUM: MAGNESIUM: 2 mg/dL (ref 1.7–2.4)

## 2017-02-06 LAB — MRSA PCR SCREENING: MRSA BY PCR: NEGATIVE

## 2017-02-06 LAB — PROTIME-INR
INR: 1.25
Prothrombin Time: 15.6 seconds — ABNORMAL HIGH (ref 11.4–15.2)

## 2017-02-06 LAB — LIPASE, BLOOD: Lipase: 31 U/L (ref 11–51)

## 2017-02-06 MED ORDER — ATORVASTATIN CALCIUM 10 MG PO TABS
10.0000 mg | ORAL_TABLET | Freq: Every day | ORAL | Status: DC
Start: 2017-02-07 — End: 2017-02-09
  Administered 2017-02-07 – 2017-02-08 (×2): 10 mg via ORAL
  Filled 2017-02-06 (×2): qty 1

## 2017-02-06 MED ORDER — ALPRAZOLAM 0.5 MG PO TABS: 0.5000 mg | ORAL_TABLET | Freq: Three times a day (TID) | ORAL | Status: DC | PRN

## 2017-02-06 MED ORDER — ALPRAZOLAM 0.5 MG PO TABS
2.0000 mg | ORAL_TABLET | Freq: Once | ORAL | Status: AC
Start: 2017-02-06 — End: 2017-02-06
  Administered 2017-02-06: 2 mg via ORAL
  Filled 2017-02-06: qty 4

## 2017-02-06 MED ORDER — PANTOPRAZOLE SODIUM 40 MG IV SOLR
40.0000 mg | INTRAVENOUS | Status: DC
  Administered 2017-02-06 – 2017-02-09 (×4): 40 mg via INTRAVENOUS
  Filled 2017-02-06 (×4): qty 40

## 2017-02-06 MED ORDER — BUPROPION HCL 100 MG PO TABS
50.0000 mg | ORAL_TABLET | Freq: Two times a day (BID) | ORAL | Status: DC
  Administered 2017-02-07 – 2017-02-12 (×9): 50 mg via ORAL
  Filled 2017-02-06 (×14): qty 0.5

## 2017-02-06 MED ORDER — QUETIAPINE FUMARATE 100 MG PO TABS
100.0000 mg | ORAL_TABLET | Freq: Every day | ORAL | Status: DC
  Administered 2017-02-06 – 2017-02-11 (×6): 100 mg via ORAL
  Filled 2017-02-06 (×9): qty 1

## 2017-02-06 MED ORDER — SODIUM CHLORIDE 0.9 % IV BOLUS (SEPSIS)
500.0000 mL | Freq: Once | INTRAVENOUS | Status: AC
  Administered 2017-02-06: 500 mL via INTRAVENOUS

## 2017-02-06 MED ORDER — ALPRAZOLAM 1 MG PO TABS
1.0000 mg | ORAL_TABLET | Freq: Two times a day (BID) | ORAL | Status: DC
  Administered 2017-02-07 – 2017-02-12 (×9): 1 mg via ORAL
  Filled 2017-02-06 (×5): qty 1
  Filled 2017-02-06: qty 2
  Filled 2017-02-06 (×3): qty 1

## 2017-02-06 MED ORDER — MEMANTINE HCL 10 MG PO TABS
5.0000 mg | ORAL_TABLET | Freq: Two times a day (BID) | ORAL | Status: DC
  Administered 2017-02-06 – 2017-02-12 (×10): 5 mg via ORAL
  Filled 2017-02-06 (×10): qty 1

## 2017-02-06 NOTE — ED Provider Notes (Signed)
[facility] EMERGENCY DEPARTMENT Provider Note   CSN: [id number] Arrival date & time: 02/06/17  1525     History   Chief Complaint Chief Complaint  Patient presents with  . Weakness    HPI Larry Mullins is a 67 y.o. male.  The history is provided by the spouse and the patient. The history is limited by the condition of the patient (Hx dementia).  Weakness   Pt was seen at 1540. Per pt and his wife: Pt c/o gradual onset and worsening of persistent generalized weakness/fatigue for the past 2 days. Has been associated with "dark stools," intermittent rectal bleeding and rectal "pain" for the past several months. Yesterday, pt felt like he needed to have a BM, but he only passed "red blood" in the toilet. Pt was evaluated by GI MD last month and is due to be scheduled for a colonoscopy. Pt's wife states pt has also been c/o generalized abd pain for an unknown period of time, but pt denies this. Denies CP/SOB, no back pain, no N/V, no fevers.   GI: R[facility]k Past Medical History:  Diagnosis Date  . Avascular necrosis of bone of hip, left ([place])   . Dementia   . Diverticulosis   . Hypertension   . Hypertension   . Superior mesenteric vein thrombosis     Patient Active Problem List   Diagnosis Date Noted  . Abnormal CT of liver 01/23/2017  . Rectal bleeding 01/23/2017  . Other cirrhosis of liver ([place]) 01/23/2017  . Superior mesenteric vein thrombosis 09/05/2015    Past Surgical History:  Procedure Laterality Date  . 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 no stents per patient       Home Medications    Prior to Admission medications   Medication Sig Start Date End Date Taking? Authorizing Provider  alprazolam Duanne Moron) 2 MG tablet Take 1 tablet by mouth 4 (four) times daily. 08/07/15   [provider]  aspirin EC 81 MG tablet Take 81 mg by mouth daily.    [provider]  atenolol (TENORMIN) 25 MG tablet Take 25 mg by mouth at bedtime.     [provider]  atorvastatin (LIPITOR) 20 MG tablet Take 10 mg by mouth daily.  08/07/15   [provider]  buPROPion (WELLBUTRIN) 100 MG tablet Take 0.5 tablets by mouth 2 (two) times daily.  06/09/15   [provider]  gabapentin (NEURONTIN) 300 MG capsule Take 100 mg by mouth 3 (three) times daily.  08/27/15   [provider]  HYDROcodone-acetaminophen (NORCO) 10-325 MG tablet Take 1 tablet by mouth 4 (four) times daily as needed for moderate pain.  08/27/15   [provider]  memantine (NAMENDA) 5 MG tablet Take 1 tablet by mouth 2 (two) times daily. 01/09/17   [provider]  traMADol (ULTRAM) 50 MG tablet Take 1 tablet by mouth 4 (four) times daily.  08/07/15   [provider]  traZODone (DESYREL) 50 MG tablet Take 50-100 mg by mouth at bedtime.    [provider]  warfarin (COUMADIN) 3 MG tablet Take 1 tablet (3 mg total) by mouth daily at 6 PM. 09/10/15   Hosie Poisson, MD    Family History Family History  Problem Relation Age of Onset  . Colon polyps Sister        unsure age of surgery, had to have colon resection   . Colon cancer Neg Hx   . Liver disease Neg Hx  Social History Social History   Tobacco Use  . Smoking status: Former Smoker    Types: Cigarettes  . Smokeless tobacco: Former Systems developer    Quit date: 1999  Substance Use Topics  . 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 Drug use: No    Comment: history of pot and LSD in teenage years      Allergies   Lopressor [metoprolol tartrate]   Review of Systems Review of Systems  Unable to perform ROS: Dementia  Neurological: Positive for weakness.     Physical Exam Updated Vital Signs BP (!) 106/55 (BP Location: Right Arm)   Pulse (!) 39   Temp 97.7 F (36.5 C) (Oral)   Resp 18   SpO2 99%   16:13 Orthostatic Vital Signs VP  Orthostatic Lying   BP- Lying: 105/65    Pulse- Lying: 70      Orthostatic Sitting  BP- Sitting: 112/71  Pulse- Sitting: 75      Orthostatic Standing at 0 minutes  BP- Standing at 0 minutes: 100/73  Pulse- Standing at 0 minutes: 80    Patient Vitals for the past 24 hrs:  BP Temp Temp src Pulse Resp SpO2  02/06/17 1700 (!) 123/93 - - - (!) 0 -  02/06/17 1630 105/67 - - 65 (!) 22 94 %  02/06/17 1600 119/65 - - 68 - 95 %  02/06/17 1527 (!) 106/55 97.7 F (36.5 C) Oral (!) 39 18 99 %    Physical Exam 1545: Physical examination:  Nursing notes reviewed; Vital signs and O2 SAT reviewed;  Constitutional: Well developed, Well nourished, Well hydrated, In no acute distress; Head:  Normocephalic, atraumatic; Eyes: EOMI, PERRL, No scleral icterus; ENMT: Mouth and pharynx normal, Mucous membranes moist; Neck: Supple, Full range of motion, No lymphadenopathy; Cardiovascular: Regular rate and rhythm, No gallop; Respiratory: Breath sounds clear & equal bilaterally, No wheezes.  Speaking full sentences with ease, Normal respiratory effort/excursion; Chest: Nontender, Movement normal; Abdomen: Soft, Nontender, Nondistended, Normal bowel sounds. Rectal exam performed w/permission of pt and ED RN chaperone present.  Anal tone normal.  Non-tender, scant amount of dark red stool in rectal vault, heme positive.  No fissures, +external hemorrhoid without thrombosis or obvious bleeding. No palp masses.; Genitourinary: No CVA tenderness; Extremities: Pulses normal, No tenderness, No edema, No calf edema or asymmetry.; Neuro: Awake, alert, confused re: events, time.  Major CN grossly intact. No facial droop. Speech clear. No gross focal motor or sensory deficits in extremities.; Skin: Color normal, Warm, Dry.   ED Treatments / Results  Labs (all labs ordered are listed, but only abnormal results are displayed)   EKG  EKG Interpretation  Date/Time:  Friday February 06 2017 15:36:48 EST Ventricular Rate:  86 PR Interval:    QRS Duration: 109 QT  Interval:  338 QTC Calculation: 318 R Axis:   79 Text Interpretation:  Sinus rhythm Ventricular bigeminy Inferior infarct, age indeterminate Lateral leads are also involved No old tracing to compare Confirmed by Francine Graven [phone]) on 02/06/2017 3:55:57 PM       Radiology   Procedures Procedures (including critical care time)  Medications Ordered in ED Medications - No data to display   Initial Impression / Assessment and Plan / ED Course  I have reviewed the triage vital signs and the nursing notes.  Pertinent labs & imaging results that were available during my care of the patient  were reviewed by me and considered in my medical decision making (see chart for details).  MDM Reviewed: previous chart, nursing note and vitals Reviewed previous: labs and ECG Interpretation: labs, CT scan, ECG and x-ray    Results for orders placed or performed during the hospital encounter of 02/06/17  Protime-INR  Result Value Ref Range   Prothrombin Time 15.6 (H) 11.4 - 15.2 seconds   INR 1.25   CBC WITH DIFFERENTIAL  Result Value Ref Range   WBC 9.1 4.0 - 10.5 K/uL   RBC 4.73 4.22 - 5.81 MIL/uL   Hemoglobin 14.8 13.0 - 17.0 g/dL   HCT 45.4 39.0 - 52.0 %   MCV 96.0 78.0 - 100.0 fL   MCH 31.3 26.0 - 34.0 pg   MCHC 32.6 30.0 - 36.0 g/dL   RDW 13.5 11.5 - 15.5 %   Platelets 215 150 - 400 K/uL   Neutrophils Relative % 71 %   Neutro Abs 6.5 1.7 - 7.7 K/uL   Lymphocytes Relative 17 %   Lymphs Abs 1.6 0.7 - 4.0 K/uL   Monocytes Relative 10 %   Monocytes Absolute 0.9 0.1 - 1.0 K/uL   Eosinophils Relative 2 %   Eosinophils Absolute 0.2 0.0 - 0.7 K/uL   Basophils Relative 0 %   Basophils Absolute 0.0 0.0 - 0.1 K/uL  Lipase, blood  Result Value Ref Range   Lipase 31 11 - 51 U/L  Comprehensive metabolic panel  Result Value Ref Range   Sodium 136 135 - 145 mmol/L   Potassium 4.1 3.5 - 5.1 mmol/L   Chloride 104 101 - 111 mmol/L   CO2 23 22 - 32 mmol/L   Glucose, Bld 162 (H) 65  - 99 mg/dL   BUN 15 6 - 20 mg/dL   Creatinine, Ser 0.93 0.61 - 1.24 mg/dL   Calcium 8.9 8.9 - 10.3 mg/dL   Total Protein 7.0 6.5 - 8.1 g/dL   Albumin 3.8 3.5 - 5.0 g/dL   AST 31 15 - 41 U/L   ALT 26 17 - 63 U/L   Alkaline Phosphatase 79 38 - 126 U/L   Total Bilirubin 0.4 0.3 - 1.2 mg/dL   GFR calc non Af Amer >60 >60 mL/min   GFR calc Af Amer >60 >60 mL/min   Anion gap 9 5 - 15  Troponin I  Result Value Ref Range   Troponin I <0.03 <0.03 ng/mL  Magnesium  Result Value Ref Range   Magnesium 2.0 1.7 - 2.4 mg/dL  POC occult blood, ED  Result Value Ref Range   Fecal Occult Bld POSITIVE (A) NEGATIVE  Type and screen [facility]  Result Value Ref Range   ABO/RH(D) O NEG    Antibody Screen NEG    Sample Expiration 02/09/2017    Ct Abdomen Pelvis Wo Contrast Result Date: 02/06/2017 CLINICAL DATA:  67 year old with generalized weakness. Blood in stool. EXAM: CT ABDOMEN AND PELVIS WITHOUT CONTRAST TECHNIQUE: Multidetector CT imaging of the abdomen and pelvis was performed following the standard protocol without IV contrast. COMPARISON:  Abdominal CT 09/05/2015 and abdominal ultrasound 01/30/2017. FINDINGS: Lower chest: Median sternotomy wires are present. Lung bases are clear without pleural effusions. Coronary artery calcifications. Hepatobiliary: Liver is mildly heterogeneous but poorly characterized on this noncontrast examination. Gallbladder is decompressed and difficult to exclude any gallbladder pathology on this examination. Pancreas: Normal appearance of the pancreas without inflammation or duct dilatation. Spleen: Normal appearance of spleen without enlargement. Adrenals/Urinary Tract: Normal adrenal glands. 2  mm stone in right kidney mid/lower pole region without hydronephrosis. Mild distention of the urinary bladder. Normal appearance of the left kidney without hydronephrosis or stones. Stomach/Bowel: Diverticulum in the distal duodenum. Colonic diverticulosis. Moderate amount  of colonic stool. No focal bowel inflammation. Normal appendix. Previously, there was stranding in the central mesentery related to SMV thrombus. This central mesenteric edema has resolved. Cannot evaluate for portal or SMV thrombosis on this noncontrast examination. Vascular/Lymphatic: Atherosclerotic calcifications in the abdominal aorta without aneurysm. Small lymph nodes in the periaortic region but no significant change. No significant lymph node enlargement in the abdomen or pelvis. Reproductive: Prostate is prominent for size measuring 4.9 cm in transverse dimension. Other: No ascites. Negative for free air. Small umbilical hernia containing fat. Surgical clips in the lower right inguinal canal near the scrotum. Musculoskeletal: Again noted is sclerosis with some collapse of the left femoral head. Prominent subchondral cystic formations in left femoral head as well. Findings compatible with avascular necrosis. IMPRESSION: Mild distention of the urinary bladder without significant hydronephrosis. Nonobstructive right kidney stone. Colonic diverticulosis without focal bowel inflammation. Chronic left femoral head avascular necrosis. Electronically Signed   By: Markus Daft M.D.   On: 02/06/2017 17:20   Dg Chest 2 View Result Date: 02/06/2017 CLINICAL DATA:  Weakness.  Hypertension.  Dementia. EXAM: CHEST  2 VIEW COMPARISON:  09/06/2015 FINDINGS: Midline trachea. Normal heart size. Prior median sternotomy. No pleural effusion or pneumothorax. Clear lungs. IMPRESSION: No acute cardiopulmonary disease. Electronically Signed   By: Abigail Miyamoto M.D.   On: 02/06/2017 17:17   Results for Larry, Mullins (MRN [id number]) as of 02/06/2017 17:53  Ref. Range 09/06/2015 20:47 09/08/2015 03:04 09/09/2015 02:12 09/10/2015 03:03 02/06/2017 15:51  Hemoglobin Latest Ref Range: 13.0 - 17.0 g/dL 11.2 (L) 10.8 (L) 10.7 (L) 11.6 (L) 14.8  HCT Latest Ref Range: 39.0 - 52.0 % 34.9 (L) 33.8 (L) 33.9 (L) 37.1 (L) 45.4    1815:   Orthostatic on VS; IVF given. H/H elevated from baseline; question heme-concentration. INR also subtherapeutic. Concern for H/H drop. Dx and testing d/w pt and family.  Questions answered.  Verb understanding, agreeable to admit.  T/C to GI Dr. Gala Romney, case discussed, including:  HPI, pertinent PM/SHx, VS/PE, dx testing, ED course and treatment:  OK to admit to trend numbers, agreeable to consult tomorrow.   1835:  T/C to [facility] Dr. Denton Brick, case discussed, including:  HPI, pertinent PM/SHx, VS/PE, dx testing, ED course and treatment:  Agreeable to admit.     Final Clinical Impressions(s) / ED Diagnoses   Final diagnoses:  None    ED Discharge Orders    None        Francine Graven, DO 02/08/17 2242

## 2017-02-06 NOTE — ED Triage Notes (Signed)
Pt c/o gen weaknes since yesterday. A/o. gen weakness noted. Pt blew out gassy blood.

## 2017-02-06 NOTE — H&P (Addendum)
History and Physical    Larry Mullins UQJ:[id number] DOB: Jun 13, 1950 DOA: 02/06/2017  PCP: Redmond School, MD   Patient coming from: Home  Chief Complaint: Bleeding per rectum  HPI: Larry Mullins is a 67 y.o. male with medical history significant for Small mesenteric vein thrombosis on Anticoagulation, Dementia, Avascular necrosis of the hip, HTN, liver cirrhosis, who was brought to ED by family with reports of bleeding per rectum last night.  Patient reports he felt gassy so he went to the bathroom and bright red blood just squitted out.  Patient reports he had just had a very hard and painful bowel movement. Patient has been having rectal bleeding with bowel movements rectal pain over the past 2-3 months.  He was sent to GI for evaluation, he is planned to have a colonoscopy in the near future.  Patient also reported generalized abdominal pain with rectal bleeding  Last night.  Patient denies fever or chills, no melena denies NSAID use.  Never had a colonoscopy upper endoscopy.  Patient has history of hemorrhoids. Patient spouse did not have transportation last night, hence presentation to the ED today.  Patient reported some difficulty breathing last night, when he passed the blood, denies cough no chest pain.  No fever no chills.  Patient spouse reports patient did not look good today, checked his vitals pulse was 43, blood pressure 117/53.  ED Course: Pulse 39 increased to 68, soft to stable blood pressure, lipase normal 31, hemoglobin stable at 14.8, platelets 215, INR subtherapeutic at 1.2, rest of blood work unremarkable UA clean.  Rectal exam in the ED dark red bloody stool, in rectal vault.  CT abdomen and pelvis without contrast diverticulosis , nonobstructive right kidney stone. 527ml bolus given. ED provider talked to Dr. Buford Dresser, GI, who recommended admission and will see patient in a.m.  Review of Systems: As per HPI otherwise 10 point review of systems negative.   Past  Medical History:  Diagnosis Date  . Avascular necrosis of bone of hip, left ([place])   . Dementia   . Diverticulosis   . Hypertension   . Hypertension   . Superior mesenteric vein thrombosis     Past Surgical History:  Procedure Laterality Date  . 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 no stents per patient    reports that he has quit smoking. His smoking use included cigarettes. He quit smokeless tobacco use about 20 years ago. He reports that he does not drink alcohol or use drugs.  Allergies  Allergen Reactions  . Lopressor [Metoprolol Tartrate] Hives    Family History  Problem Relation Age of Onset  . Colon polyps Sister        unsure age of surgery, had to have colon resection   . Colon cancer Neg Hx   . Liver disease Neg Hx     Prior to Admission medications   Medication Sig Start Date End Date Taking? Authorizing Provider  alprazolam Duanne Moron) 2 MG tablet Take 1 tablet by mouth 4 (four) times daily. 08/07/15  Yes [provider]  aspirin EC 81 MG tablet Take 81 mg by mouth daily.   Yes [provider]  atenolol (TENORMIN) 25 MG tablet Take 25 mg by mouth at bedtime.   Yes [provider]  atorvastatin (LIPITOR) 10 MG tablet Take 10 mg by mouth daily.  08/07/15  Yes [provider]  buPROPion (WELLBUTRIN) 100 MG tablet Take 0.5 tablets by mouth 2 (two) times daily.  06/09/15  Yes [provider]  HYDROcodone-acetaminophen (NORCO) 10-325 MG tablet Take 1 tablet by mouth 4 (four) times daily as needed for moderate pain.  08/27/15  Yes [provider]  memantine (NAMENDA) 5 MG tablet Take 1 tablet by mouth 2 (two) times daily. 01/09/17  Yes [provider]  QUEtiapine (SEROQUEL) 100 MG tablet Take 100 mg by mouth at bedtime.   Yes [provider]  traMADol (ULTRAM) 50 MG tablet Take 1 tablet by mouth 4 (four) times daily.  08/07/15  Yes [provider]  warfarin (COUMADIN) 3 MG tablet Take  1 tablet (3 mg total) by mouth daily at 6 PM. 09/10/15  Yes Hosie Poisson, MD    Physical Exam: Vitals:   02/06/17 1630 02/06/17 1700 02/06/17 1800 02/06/17 1830  BP: 105/67 (!) 123/93 115/69 118/70  Pulse: 65  63 63  Resp: (!) 22 (!) 0  19  Temp:      TempSrc:      SpO2: 94%  98% 99%    Constitutional: NAD, calm, comfortable Vitals:   02/06/17 1630 02/06/17 1700 02/06/17 1800 02/06/17 1830  BP: 105/67 (!) 123/93 115/69 118/70  Pulse: 65  63 63  Resp: (!) 22 (!) 0  19  Temp:      TempSrc:      SpO2: 94%  98% 99%   Eyes: PERRL, lids and conjunctivae normal ENMT: Mucous membranes are moist. Posterior pharynx clear of any exudate or lesions.  Neck: normal, supple, no masses, no thyromegaly Respiratory: clear to auscultation bilaterally, no wheezing, no crackles. Normal respiratory effort. No accessory muscle use.  Cardiovascular: Regular rate and rhythm, no murmurs / rubs / gallops. No extremity edema. 2+ pedal pulses. No carotid bruits.  Abdomen: no tenderness, no masses palpated. No hepatosplenomegaly. Bowel sounds positive.  Musculoskeletal: no clubbing / cyanosis. No joint deformity upper and lower extremities. Good ROM, no contractures. Normal muscle tone.  Skin: no rashes, lesions, ulcers. No induration Neurologic: CN 2-12 grossly intact. Strength 5/5 in all 4.  Psychiatric: Normal judgment and insight. Alert and oriented x 3. Normal mood.   Labs on Admission: I have personally reviewed following labs and imaging studies  CBC: Recent Labs  Lab 02/06/17 1551  WBC 9.1  NEUTROABS 6.5  HGB 14.8  HCT 45.4  MCV 96.0  PLT 412   Basic Metabolic Panel: Recent Labs  Lab 02/06/17 1547 02/06/17 1551  NA  --  136  K  --  4.1  CL  --  104  CO2  --  23  GLUCOSE  --  162*  BUN  --  15  CREATININE  --  0.93  CALCIUM  --  8.9  MG 2.0  --    GFR: CrCl cannot be calculated (Unknown ideal weight.). Liver Function Tests: Recent Labs  Lab 02/06/17 1551  AST 31  ALT 26   ALKPHOS 79  BILITOT 0.4  PROT 7.0  ALBUMIN 3.8   Recent Labs  Lab 02/06/17 1551  LIPASE 31   No results for input(s): AMMONIA in the last 168 hours. Coagulation Profile: Recent Labs  Lab 02/06/17 1551  INR 1.25   Cardiac Enzymes: Recent Labs  Lab 02/06/17 1551  TROPONINI <0.03   Urine analysis:    Component Value Date/Time   COLORURINE YELLOW 02/06/2017 [place] 02/06/2017 1730   LABSPEC 1.009 02/06/2017 1730   PHURINE 7.0 02/06/2017 1730   GLUCOSEU NEGATIVE 02/06/2017 1730   HGBUR NEGATIVE 02/06/2017 1730  BILIRUBINUR NEGATIVE 02/06/2017 1730   KETONESUR NEGATIVE 02/06/2017 1730   PROTEINUR NEGATIVE 02/06/2017 1730   NITRITE NEGATIVE 02/06/2017 1730   LEUKOCYTESUR NEGATIVE 02/06/2017 1730    Radiological Exams on Admission: Ct Abdomen Pelvis Wo Contrast  Result Date: 02/06/2017 CLINICAL DATA:  67 year old with generalized weakness. Blood in stool. EXAM: CT ABDOMEN AND PELVIS WITHOUT CONTRAST TECHNIQUE: Multidetector CT imaging of the abdomen and pelvis was performed following the standard protocol without IV contrast. COMPARISON:  Abdominal CT 09/05/2015 and abdominal ultrasound 01/30/2017. FINDINGS: Lower chest: Median sternotomy wires are present. Lung bases are clear without pleural effusions. Coronary artery calcifications. Hepatobiliary: Liver is mildly heterogeneous but poorly characterized on this noncontrast examination. Gallbladder is decompressed and difficult to exclude any gallbladder pathology on this examination. Pancreas: Normal appearance of the pancreas without inflammation or duct dilatation. Spleen: Normal appearance of spleen without enlargement. Adrenals/Urinary Tract: Normal adrenal glands. 2 mm stone in right kidney mid/lower pole region without hydronephrosis. Mild distention of the urinary bladder. Normal appearance of the left kidney without hydronephrosis or stones. Stomach/Bowel: Diverticulum in the distal duodenum. Colonic  diverticulosis. Moderate amount of colonic stool. No focal bowel inflammation. Normal appendix. Previously, there was stranding in the central mesentery related to SMV thrombus. This central mesenteric edema has resolved. Cannot evaluate for portal or SMV thrombosis on this noncontrast examination. Vascular/Lymphatic: Atherosclerotic calcifications in the abdominal aorta without aneurysm. Small lymph nodes in the periaortic region but no significant change. No significant lymph node enlargement in the abdomen or pelvis. Reproductive: Prostate is prominent for size measuring 4.9 cm in transverse dimension. Other: No ascites. Negative for free air. Small umbilical hernia containing fat. Surgical clips in the lower right inguinal canal near the scrotum. Musculoskeletal: Again noted is sclerosis with some collapse of the left femoral head. Prominent subchondral cystic formations in left femoral head as well. Findings compatible with avascular necrosis. IMPRESSION: Mild distention of the urinary bladder without significant hydronephrosis. Nonobstructive right kidney stone. Colonic diverticulosis without focal bowel inflammation. Chronic left femoral head avascular necrosis. Electronically Signed   By: Markus Daft M.D.   On: 02/06/2017 17:20   Dg Chest 2 View  Result Date: 02/06/2017 CLINICAL DATA:  Weakness.  Hypertension.  Dementia. EXAM: CHEST  2 VIEW COMPARISON:  09/06/2015 FINDINGS: Midline trachea. Normal heart size. Prior median sternotomy. No pleural effusion or pneumothorax. Clear lungs. IMPRESSION: No acute cardiopulmonary disease. Electronically Signed   By: Abigail Miyamoto M.D.   On: 02/06/2017 17:17    EKG: Independently reviewed.  Ventricle bigeminy.  PR interval, does not appear prolonged  Assessment/Plan Principal Problem:   GI bleed Active Problems:   Superior mesenteric vein thrombosis   Bradycardia  GI bleed- hgb - 14.8, baseline- 10-11, blood pressure soft to stable , appears lower.  CT  shows diverticulosis, history of hemorrhoids. INR- 1.2.  - CBC every 12 hourly X2 - iv protonix 40mg  daily - NPO midnight, clears for now incase of massive bleed. - GI consult, order placed.  Bradycardia- recorded at 39 on admission, improved now 60s, no chest pain. EKG-ventricular bigeminy.  Dropped beats, PR interval not prolonged. No old EKG to compare.  Likely secondary to beta-blocker, though small dose. -Trend troponins. -Admit to stepdown -Hold beta-blocker atenolol 25mg  daily  Superior mesenteric vein thrombosis- diagnosed 08/2015, on warfarin for anticoag.  -Hold Warfarin for now.  Dementia, anxiety- patient has being taking 1mg , previously 2mg  of xanax daily, up to 4 times a day.  - resume xanax dose at 1mg  ,  scheduled BID, to prevent withdrawal seizures.  HIV as part of routine health screen.  DVT prophylaxis: SCDs Code Status: Full  Family Communication: Spouse at bedside, who is HCPOA Disposition Plan: To be determined Consults called: GI Admission status: Obs, tele   Bethena Roys MD [facility] Pager [phone]  If 11PM-7AM, please contact night-coverage [email or web address] Password [facility]  02/06/2017, 7:40 PM

## 2017-02-06 NOTE — ED Notes (Signed)
Denies pain, states " I just don't feel good" wife at the bedside

## 2017-02-07 DIAGNOSIS — K2950 Unspecified chronic gastritis without bleeding: Secondary | ICD-10-CM | POA: Diagnosis not present

## 2017-02-07 DIAGNOSIS — I951 Orthostatic hypotension: Secondary | ICD-10-CM | POA: Diagnosis not present

## 2017-02-07 DIAGNOSIS — K921 Melena: Secondary | ICD-10-CM

## 2017-02-07 DIAGNOSIS — R001 Bradycardia, unspecified: Secondary | ICD-10-CM | POA: Diagnosis not present

## 2017-02-07 DIAGNOSIS — K625 Hemorrhage of anus and rectum: Secondary | ICD-10-CM | POA: Diagnosis not present

## 2017-02-07 DIAGNOSIS — K922 Gastrointestinal hemorrhage, unspecified: Secondary | ICD-10-CM

## 2017-02-07 LAB — CBC
HEMATOCRIT: 45.6 % (ref 39.0–52.0)
Hemoglobin: 14.9 g/dL (ref 13.0–17.0)
MCH: 31.2 pg (ref 26.0–34.0)
MCHC: 32.7 g/dL (ref 30.0–36.0)
MCV: 95.4 fL (ref 78.0–100.0)
PLATELETS: 182 10*3/uL (ref 150–400)
RBC: 4.78 MIL/uL (ref 4.22–5.81)
RDW: 13.4 % (ref 11.5–15.5)
WBC: 8 10*3/uL (ref 4.0–10.5)

## 2017-02-07 LAB — TROPONIN I: Troponin I: <0.03 ng/mL (ref <0.03)

## 2017-02-07 MED ORDER — TRAZODONE HCL 50 MG PO TABS
50.0000 mg | ORAL_TABLET | Freq: Once | ORAL | Status: AC
  Administered 2017-02-07: 50 mg via ORAL
  Filled 2017-02-07: qty 1

## 2017-02-07 MED ORDER — POLYETHYLENE GLYCOL 3350 17 G PO PACK
17.0000 g | PACK | Freq: Every day | ORAL | Status: DC
  Administered 2017-02-07 – 2017-02-12 (×4): 17 g via ORAL
  Filled 2017-02-07 (×4): qty 1

## 2017-02-07 MED ORDER — DOCUSATE SODIUM 100 MG PO CAPS
100.0000 mg | ORAL_CAPSULE | Freq: Two times a day (BID) | ORAL | Status: DC
  Administered 2017-02-07 – 2017-02-12 (×9): 100 mg via ORAL
  Filled 2017-02-07 (×9): qty 1

## 2017-02-07 NOTE — Progress Notes (Signed)
PROGRESS NOTE    Larry Mullins  ZHG:[id number] DOB: [birth year] DOA: 02/06/2017 PCP: Redmond School, MD     Brief Narrative:  67 year old man admitted from home on 1/11 after an episode of bright red blood per rectum.  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  Globin has remained stable overnight, GI consult was requested and he was admitted to the hospital for further evaluation and management.   Assessment & Plan:   Principal Problem:   GI bleed Active Problems:   Superior mesenteric vein thrombosis   Bradycardia   Melena   Orthostatic hypotension   Bright red blood per rectum -Etiology unclear, of note he is on chronic opioids and was constipated and he self disimpacted, probably causing some manual trauma to the anus. -Okay to continue Protonix for now, CBC has been stable, no need for blood transfusion. -Appreciate GI input and recommendations. -They are recommending EGD and colonoscopy this admission after seen by cardiology.  Also recommend holding Coumadin for now.  Bradycardia -Was in the 4s on admission increased to 60s. -Agree with holding atenolol for now.  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 -As per above, hold anticoagulation pending formal GI evaluation. -Of note his INR was subtherapeutic on admission.   DVT prophylaxis: SCDs Code Status: Full code Family Communication: Patient only Disposition Plan: Anticipate at least 48 more hours in the hospital  Consultants:   GI  Procedures:   None  Antimicrobials:  Anti-infectives (From admission, onward)   None       Subjective: No complaints, no further bleeding since admission  Objective: Vitals:   02/07/17 0500 02/07/17 0600 02/07/17 0700 02/07/17 0800  BP: 111/75 104/75 106/62 118/73  Pulse: (!) 58 66 65 68  Resp:    13  Temp:    98.1  F (36.7 C)  TempSrc:    Oral  SpO2: 96% 96% 93% 97%  Weight:      Height:        Intake/Output Summary (Last 24 hours) at 02/07/2017 1520 Last data filed at 02/07/2017 0400 Gross per 24 hour  Intake 740 ml  Output 1650 ml  Net -910 ml   Filed Weights   02/06/17 2216  Weight: 78.8 kg (173 lb 11.6 oz)    Examination:   General exam: Alert, awake, oriented x 3 Respiratory system: Clear to auscultation. Respiratory effort normal. Cardiovascular system:RRR. No murmurs, rubs, gallops. Gastrointestinal system: Abdomen is nondistended, soft and nontender. No organomegaly or masses felt. Normal bowel sounds heard. Central nervous system: Alert and oriented. No focal neurological deficits. Extremities: No C/C/E, +pedal pulses Skin: No rashes, lesions or ulcers Psychiatry: Judgement and insight appear normal. Mood & affect appropriate.     Data Reviewed: I have personally reviewed following labs and imaging studies  CBC: Recent Labs  Lab 02/06/17 1551 02/06/17 2116 02/07/17 0230  WBC 9.1 7.4 8.0  NEUTROABS 6.5  --   --   HGB 14.8 14.8 14.9  HCT 45.4 45.5 45.6  MCV 96.0 96.0 95.4  PLT 215 215 196   Basic Metabolic Panel: Recent Labs  Lab 02/06/17 1547 02/06/17 1551  NA  --  136  K  --  4.1  CL  --  104  CO2  --  23  GLUCOSE  --  162*  BUN  --  15  CREATININE  --  0.93  CALCIUM  --  8.9  MG 2.0  --    GFR: Estimated Creatinine Clearance: 76.6 mL/min (by C-G formula based on SCr of 0.93 mg/dL). Liver Function Tests: Recent Labs  Lab 02/06/17 1551  AST 31  ALT 26  ALKPHOS 79  BILITOT 0.4  PROT 7.0  ALBUMIN 3.8   Recent Labs  Lab 02/06/17 1551  LIPASE 31   No results for input(s): AMMONIA in the last 168 hours. Coagulation Profile: Recent Labs  Lab 02/06/17 1551  INR 1.25   Cardiac Enzymes: Recent Labs  Lab 02/06/17 1551 02/06/17 2116 02/07/17 0230  TROPONINI <0.03 <0.03 <0.03   BNP (last 3 results) No results for input(s): PROBNP in  the last 8760 hours. HbA1C: No results for input(s): HGBA1C in the last 72 hours. CBG: No results for input(s): GLUCAP in the last 168 hours. Lipid Profile: No results for input(s): CHOL, HDL, LDLCALC, TRIG, CHOLHDL, LDLDIRECT in the last 72 hours. Thyroid Function Tests: No results for input(s): TSH, T4TOTAL, FREET4, T3FREE, THYROIDAB in the last 72 hours. Anemia Panel: No results for input(s): VITAMINB12, FOLATE, FERRITIN, TIBC, IRON, RETICCTPCT in the last 72 hours. Urine analysis:    Component Value Date/Time   COLORURINE YELLOW 02/06/2017 1730   APPEARANCEUR CLEAR 02/06/2017 1730   LABSPEC 1.009 02/06/2017 1730   PHURINE 7.0 02/06/2017 1730   GLUCOSEU NEGATIVE 02/06/2017 1730   HGBUR NEGATIVE 02/06/2017 1730   BILIRUBINUR NEGATIVE 02/06/2017 1730   KETONESUR NEGATIVE 02/06/2017 1730   PROTEINUR NEGATIVE 02/06/2017 1730   NITRITE NEGATIVE 02/06/2017 1730   LEUKOCYTESUR NEGATIVE 02/06/2017 1730   Sepsis Labs: @LABRCNTIP (procalcitonin:4,lacticidven:4)  ) Recent Results (from the past 240 hour(s))  MRSA PCR Screening     Status: None   Collection Time: 02/06/17  8:27 PM  Result Value Ref Range Status   MRSA by PCR NEGATIVE NEGATIVE Final    Comment:        The GeneXpert MRSA Assay (FDA approved for NASAL specimens only), is one component of a comprehensive MRSA colonization surveillance program. It is not intended to diagnose MRSA infection nor to guide or monitor treatment for MRSA infections.          Radiology Studies: Ct Abdomen Pelvis Wo Contrast  Result Date: 02/06/2017 CLINICAL DATA:  67 year old with generalized weakness. Blood in stool. EXAM: CT ABDOMEN AND PELVIS WITHOUT CONTRAST TECHNIQUE: Multidetector CT imaging of the abdomen and pelvis was performed following the standard protocol without IV contrast. COMPARISON:  Abdominal CT 09/05/2015 and abdominal ultrasound 01/30/2017. FINDINGS: Lower chest: Median sternotomy wires are present. Lung bases  are clear without pleural effusions. Coronary artery calcifications. Hepatobiliary: Liver is mildly heterogeneous but poorly characterized on this noncontrast examination. Gallbladder is decompressed and difficult to exclude any gallbladder pathology on this examination. Pancreas: Normal appearance of the pancreas without inflammation or duct dilatation. Spleen: Normal appearance of spleen without enlargement. Adrenals/Urinary Tract: Normal adrenal glands. 2 mm stone in right kidney mid/lower pole region without hydronephrosis. Mild distention of the urinary bladder. Normal appearance of the left kidney without hydronephrosis or stones. Stomach/Bowel: Diverticulum in the distal duodenum. Colonic diverticulosis. Moderate amount of colonic stool. No focal bowel inflammation. Normal appendix. Previously, there was stranding in the central mesentery related to SMV thrombus. This central mesenteric edema has resolved. Cannot evaluate for portal or SMV thrombosis on this noncontrast examination. Vascular/Lymphatic: Atherosclerotic calcifications in the abdominal aorta without aneurysm. Small lymph nodes in the periaortic region but no significant change. No significant lymph node enlargement in the abdomen or pelvis. Reproductive: Prostate  is prominent for size measuring 4.9 cm in transverse dimension. Other: No ascites. Negative for free air. Small umbilical hernia containing fat. Surgical clips in the lower right inguinal canal near the scrotum. Musculoskeletal: Again noted is sclerosis with some collapse of the left femoral head. Prominent subchondral cystic formations in left femoral head as well. Findings compatible with avascular necrosis. IMPRESSION: Mild distention of the urinary bladder without significant hydronephrosis. Nonobstructive right kidney stone. Colonic diverticulosis without focal bowel inflammation. Chronic left femoral head avascular necrosis. Electronically Signed   By: Markus Daft M.D.   On:  02/06/2017 17:20   Dg Chest 2 View  Result Date: 02/06/2017 CLINICAL DATA:  Weakness.  Hypertension.  Dementia. EXAM: CHEST  2 VIEW COMPARISON:  09/06/2015 FINDINGS: Midline trachea. Normal heart size. Prior median sternotomy. No pleural effusion or pneumothorax. Clear lungs. IMPRESSION: No acute cardiopulmonary disease. Electronically Signed   By: Abigail Miyamoto M.D.   On: 02/06/2017 17:17        Scheduled Meds: . ALPRAZolam  1 mg Oral BID  . atorvastatin  10 mg Oral q1800  . buPROPion  50 mg Oral BID  . docusate sodium  100 mg Oral BID  . memantine  5 mg Oral BID  . pantoprazole (PROTONIX) IV  40 mg Intravenous Q24H  . polyethylene glycol  17 g Oral Daily  . QUEtiapine  100 mg Oral QHS   Continuous Infusions:   LOS: 0 days    Time spent: 25 minutes. Greater than 50% of this time was spent in direct contact with the patient coordinating care.     Lelon Frohlich, MD [facility] Pager [phone]  If 7PM-7AM, please contact night-coverage [email or web address] Password [email or web address] 02/07/2017, 3:20 PM

## 2017-02-07 NOTE — Consult Note (Signed)
Referring Provider: No ref. provider found Primary Care Physician:  Redmond School, MD Primary Gastroenterologist:  Dr. Gala Romney  Reason for Consultation:  Hematochezia   HPI: Pleasant 67 year old gentleman came to the emergency department yesterday after an episode of diaphoresis, hypotension and bradycardia. Evaluated by Dr. Thurnell Garbe. Found to be hemodynamically stable in the ED. There was report of some rectal bleeding. Patient chronically constipated on the opioids and recently attempted to digitally disimpact himself. In the ED hemoglobin in the 14 range. Digital rectal exam by Dr. Thurnell Garbe demonstrated some dark stool but no gross blood  -  Hemoccult positive. He has remained stable overnight in the hospital. No melena or blood per rectum. No hematemesis.  Hemoglobin remains in the 14 range  History of SMV thrombosis chronically anticoagulated-INR 1.2 on admission.  Suggestion of chronic liver disease on CT and ultrasound.  No splenomegaly or evidence of portal hypertension on imaging. Platelet count remains well within the normal range. Distant history of heavy alcohol abuse. Recent viral markers negative for chronic viral hepatitis. He is not immune to hepatitis B and C.  He was recently seen in our office for rectal pain and low volume rectal bleeding. Plans being made for colonoscopy in the near future. History of CAD without any recent cardiac evaluation. We were arranging an updated cardiology evaluation prior to embarking on endoscopic evaluation.    No family history of colon cancer. No prior colonoscopy or EGD.  In addition to rectal bleeding pain and constipation. Patient notes he has had worsening reflux symptoms recently and has difficulty swallowing bread and meat.  Sees neurologist for dementia. History of cortical atrophy.  Past Medical History:  Diagnosis Date  . Avascular necrosis of bone of hip, left ([place])   . Dementia   . Diverticulosis   . Hypertension   .  Hypertension   . Superior mesenteric vein thrombosis     Past Surgical History:  Procedure Laterality Date  . 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 no stents per patient    Prior to Admission medications   Medication Sig Start Date End Date Taking? Authorizing Provider  alprazolam Duanne Moron) 2 MG tablet Take 1 tablet by mouth 4 (four) times daily. 08/07/15  Yes [provider]  aspirin EC 81 MG tablet Take 81 mg by mouth daily.   Yes [provider]  atenolol (TENORMIN) 25 MG tablet Take 25 mg by mouth at bedtime.   Yes [provider]  atorvastatin (LIPITOR) 10 MG tablet Take 10 mg by mouth daily.  08/07/15  Yes [provider]  buPROPion (WELLBUTRIN) 100 MG tablet Take 0.5 tablets by mouth 2 (two) times daily.  06/09/15  Yes [provider]  HYDROcodone-acetaminophen (NORCO) 10-325 MG tablet Take 1 tablet by mouth 4 (four) times daily as needed for moderate pain.  08/27/15  Yes [provider]  memantine (NAMENDA) 5 MG tablet Take 1 tablet by mouth 2 (two) times daily. 01/09/17  Yes [provider]  QUEtiapine (SEROQUEL) 100 MG tablet Take 100 mg by mouth at bedtime.   Yes [provider]  traMADol (ULTRAM) 50 MG tablet Take 1 tablet by mouth 4 (four) times daily.  08/07/15  Yes [provider]  warfarin (COUMADIN) 3 MG tablet Take 1 tablet (3 mg total) by mouth daily at 6 PM. 09/10/15  Yes Hosie Poisson, MD    Current Facility-Administered Medications  Medication Dose Route Frequency Provider Last Rate Last Dose  . ALPRAZolam (  XANAX) tablet 1 mg  1 mg Oral BID Emokpae, Ejiroghene E, MD      . atorvastatin (LIPITOR) tablet 10 mg  10 mg Oral q1800 Emokpae, Ejiroghene E, MD      . buPROPion (WELLBUTRIN) tablet 50 mg  50 mg Oral BID Emokpae, Ejiroghene E, MD   50 mg at 02/07/17 0119  . memantine (NAMENDA) tablet 5 mg  5 mg Oral BID Emokpae, Ejiroghene E, MD   5 mg at 02/06/17 2103  . pantoprazole  (PROTONIX) injection 40 mg  40 mg Intravenous Q24H Emokpae, Ejiroghene E, MD   40 mg at 02/06/17 2103  . QUEtiapine (SEROQUEL) tablet 100 mg  100 mg Oral QHS Emokpae, Ejiroghene E, MD   100 mg at 02/06/17 2214    Allergies as of 02/06/2017 - Review Complete 02/06/2017  Allergen Reaction Noted  . Lopressor [metoprolol tartrate] Hives 09/05/2015    Family History  Problem Relation Age of Onset  . Colon polyps Sister        unsure age of surgery, had to have colon resection   . Colon cancer Neg Hx   . Liver disease Neg Hx     Social History   Socioeconomic History  . Marital status: Married    Spouse name: Not on file  . Number of children: Not on file  . Years of education: Not on file  . Highest education level: Not on file  Social Needs  . Financial resource strain: Not on file  . Food insecurity - worry: Not on file  . Food insecurity - inability: Not on file  . Transportation needs - medical: Not on file  . Transportation needs - non-medical: Not on file  Occupational History  . Not on file  Tobacco Use  . Smoking status: Former Smoker    Types: Cigarettes  . Smokeless tobacco: Former Systems developer    Quit date: 1999  Substance and Sexual Activity  . 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 Sexual activity: Not on file  Other Topics Concern  . Not on file  Social History Narrative  . Not on file    Review of Systems: As in history of present illness   Physical Exam: Vital signs in last 24 hours: Temp:  [97.7 F (36.5 C)-98.3 F (36.8 C)] 98.1 F (36.7 C) (01/12 0800) Pulse Rate:  [39-85] 68 (01/12 0800) Resp:  [0-22] 13 (01/12 0800) BP: (94-133)/(55-93) 118/73 (01/12 0800) SpO2:  [93 %-100 %] 97 % (01/12 0800) Weight:  [173 lb 11.6 oz (78.8 kg)] 173 lb 11.6 oz (78.8 kg) (01/11 2216) Last BM Date: 02/06/17 General:    Alert,   pleasant and cooperative in NAD.  He is oriented to place time and situation. Word finding difficulties. No asterixis. Head:  Normocephalic and atraumatic. Eyes:  Sclera clear, no icterus.   Conjunctiva pink. Lungs:  Clear throughout to auscultation.   No wheezes, crackles, or rhonchi. No acute distress. Heart:  Regular rate and rhythm; no murmurs, clicks, rubs,  or gallops. Abdomen:  Nondistended. Positive bowel sounds. Soft and nontender without appreciable mass or organomegaly Rectal:  Done by EDP as described   Intake/Output from previous day: 01/11 0701 - 01/12 0700 In: 740 [P.O.:240; IV Piggyback:500] Out: 1650 [Urine:1650] Intake/Output this shift: No intake/output data recorded.  Lab  Results: Recent Labs    02/06/17 1551 02/06/17 2116 02/07/17 0230  WBC 9.1 7.4 8.0  HGB 14.8 14.8 14.9  HCT 45.4 45.5 45.6  PLT 215 215 182   BMET Recent Labs    02/06/17 1551  NA 136  K 4.1  CL 104  CO2 23  GLUCOSE 162*  BUN 15  CREATININE 0.93  CALCIUM 8.9   LFT Recent Labs    02/06/17 1551  PROT 7.0  ALBUMIN 3.8  AST 31  ALT 26  ALKPHOS 79  BILITOT 0.4   PT/INR Recent Labs    02/06/17 1551  LABPROT 15.6*  INR 1.25   Hepatitis Panel No results for input(s): HEPBSAG, HCVAB, HEPAIGM, HEPBIGM in the last 72 hours. C-Diff No results for input(s): CDIFFTOX in the last 72 hours.  Studies/Results: Ct Abdomen Pelvis Wo Contrast  Result Date: 02/06/2017 CLINICAL DATA:  67 year old with generalized weakness. Blood in stool. EXAM: CT ABDOMEN AND PELVIS WITHOUT CONTRAST TECHNIQUE: Multidetector CT imaging of the abdomen and pelvis was performed following the standard protocol without IV contrast. COMPARISON:  Abdominal CT 09/05/2015 and abdominal ultrasound 01/30/2017. FINDINGS: Lower chest: Median sternotomy wires are present. Lung bases are clear without pleural effusions. Coronary artery calcifications. Hepatobiliary: Liver is mildly heterogeneous but poorly  characterized on this noncontrast examination. Gallbladder is decompressed and difficult to exclude any gallbladder pathology on this examination. Pancreas: Normal appearance of the pancreas without inflammation or duct dilatation. Spleen: Normal appearance of spleen without enlargement. Adrenals/Urinary Tract: Normal adrenal glands. 2 mm stone in right kidney mid/lower pole region without hydronephrosis. Mild distention of the urinary bladder. Normal appearance of the left kidney without hydronephrosis or stones. Stomach/Bowel: Diverticulum in the distal duodenum. Colonic diverticulosis. Moderate amount of colonic stool. No focal bowel inflammation. Normal appendix. Previously, there was stranding in the central mesentery related to SMV thrombus. This central mesenteric edema has resolved. Cannot evaluate for portal or SMV thrombosis on this noncontrast examination. Vascular/Lymphatic: Atherosclerotic calcifications in the abdominal aorta without aneurysm. Small lymph nodes in the periaortic region but no significant change. No significant lymph node enlargement in the abdomen or pelvis. Reproductive: Prostate is prominent for size measuring 4.9 cm in transverse dimension. Other: No ascites. Negative for free air. Small umbilical hernia containing fat. Surgical clips in the lower right inguinal canal near the scrotum. Musculoskeletal: Again noted is sclerosis with some collapse of the left femoral head. Prominent subchondral cystic formations in left femoral head as well. Findings compatible with avascular necrosis. IMPRESSION: Mild distention of the urinary bladder without significant hydronephrosis. Nonobstructive right kidney stone. Colonic diverticulosis without focal bowel inflammation. Chronic left femoral head avascular necrosis. Electronically Signed   By: Markus Daft M.D.   On: 02/06/2017 17:20   Dg Chest 2 View  Result Date: 02/06/2017 CLINICAL DATA:  Weakness.  Hypertension.  Dementia. EXAM: CHEST  2  VIEW COMPARISON:  09/06/2015 FINDINGS: Midline trachea. Normal heart size. Prior median sternotomy. No pleural effusion or pneumothorax. Clear lungs. IMPRESSION: No acute cardiopulmonary disease. Electronically Signed   By: Abigail Miyamoto M.D.   On: 02/06/2017 17:17   Manus Rudd  02/07/2017, 10:05 AM  Impression:  Pleasant 67 year old gentleman admitted to the hospital with recent episode of diaphoresis, bradycardia and hypotension. Chronic intermittent rectal pain/bleeding, constipation with recent self digital disimpaction.  Chronic opioid use.  It sounds like patient had a vasovagal episode preceding presentation to ED. History of significant coronary artery disease. No recent cardiac evaluation. Hemoglobin has remained very much in the  normal range. I doubt a significant acute hemorrhage. Suggestion of chronic liver disease on imaging although I doubt advanced chronic liver disease without signs symptoms of portal hypertension.  Moreover, platelet count and serum albumin normal. History of significant alcohol use in the distant past. History of SMV thrombosis of uncertain significance/origin. No suggestion of overt progression based on recent CT but this was a noncontrast study. Chronically anticoagulated with subtherapeutic INR. Patient relates poorly controlled GERD symptoms with solid food dysphagia.  Recommendations:  No need for emergent endoscopic evaluation although I feel he should go ahead and have both an EGD and a colonoscopy prior to discharge. Updated cardiology evaluation as previously recommended prior to endoscopic evaluatio( would utilize propofol for sedation).. Agree with PPI therapy. Will add a stool softener and MiraLAX.  Will advance to a clear liquid diet. As discussed with Dr. Jerilee Hoh, would hold on anticoagulation until seen by cardiology and endoscopic evaluation completed this admission. Will follow along with you.       Notice:  This dictation was prepared with  Dragon dictation along with smaller phrase technology. Any transcriptional errors that result from this process are unintentional and may not be corrected upon review.

## 2017-02-08 DIAGNOSIS — K921 Melena: Secondary | ICD-10-CM | POA: Diagnosis not present

## 2017-02-08 DIAGNOSIS — R531 Weakness: Secondary | ICD-10-CM | POA: Diagnosis not present

## 2017-02-08 DIAGNOSIS — K2950 Unspecified chronic gastritis without bleeding: Secondary | ICD-10-CM | POA: Diagnosis not present

## 2017-02-08 LAB — CBC
HEMATOCRIT: 45.4 % (ref 39.0–52.0)
Hemoglobin: 14.8 g/dL (ref 13.0–17.0)
MCH: 31.2 pg (ref 26.0–34.0)
MCHC: 32.6 g/dL (ref 30.0–36.0)
MCV: 95.8 fL (ref 78.0–100.0)
Platelets: 223 10*3/uL (ref 150–400)
RBC: 4.74 MIL/uL (ref 4.22–5.81)
RDW: 13.4 % (ref 11.5–15.5)
WBC: 9.2 10*3/uL (ref 4.0–10.5)

## 2017-02-08 LAB — URINE CULTURE: CULTURE: NO GROWTH

## 2017-02-08 LAB — BASIC METABOLIC PANEL
ANION GAP: 11 (ref 5–15)
BUN: 11 mg/dL (ref 6–20)
CO2: 29 mmol/L (ref 22–32)
Calcium: 9.1 mg/dL (ref 8.9–10.3)
Chloride: 104 mmol/L (ref 101–111)
Creatinine, Ser: 1.07 mg/dL (ref 0.61–1.24)
Glucose, Bld: 101 mg/dL — ABNORMAL HIGH (ref 65–99)
POTASSIUM: 3.9 mmol/L (ref 3.5–5.1)
Sodium: 144 mmol/L (ref 135–145)

## 2017-02-08 LAB — HIV ANTIBODY (ROUTINE TESTING W REFLEX): HIV SCREEN 4TH GENERATION: NONREACTIVE

## 2017-02-08 MED ORDER — MAGNESIUM CITRATE PO SOLN
1.0000 | Freq: Once | ORAL | Status: AC
  Administered 2017-02-08: 1 via ORAL
  Filled 2017-02-08: qty 296

## 2017-02-08 NOTE — Progress Notes (Signed)
PROGRESS NOTE    Larry Mullins  EPP:[id number] DOB: 1950-05-14 DOA: 02/06/2017 PCP: Redmond School, MD     Brief Narrative:  67 year old man admitted from home on 1/11 after an episode of bright red blood per rectum.  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  Hemoglobin has remained stable, GI consult was requested and he was admitted to the hospital for further evaluation and management.   Assessment & Plan:   Principal Problem:   GI bleed Active Problems:   Superior mesenteric vein thrombosis   Bradycardia   Melena   Orthostatic hypotension   Bright red blood per rectum -Etiology unclear, of note he is on chronic opioids and was constipated and he self disimpacted, probably causing some manual trauma. -Okay to continue Protonix for now, CBC has been stable, no need for blood transfusion. -no further bleeding since admission. -Appreciate GI input and recommendations. -They are recommending EGD and colonoscopy this admission but are requesting cardiac clearance prior. - Also recommend holding Coumadin for now.  Bradycardia -Was in the 74s on admission increased to 60s. -Agree with holding atenolol for now.  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 -Anticoagulation remains on hold for now. -Of note his INR was subtherapeutic on admission.   DVT prophylaxis: SCDs Code Status: Full code Family Communication: Patient only Disposition Plan: Anticipate at least 48 more hours in the hospital  Consultants:   GI  Procedures:   None  Antimicrobials:  Anti-infectives (From admission, onward)   None       Subjective: No new events. No further bleeding.  Objective: Vitals:   02/07/17 1700 02/07/17 1800 02/07/17 2126 02/08/17 0458  BP: (!) 122/102 125/79 119/69 121/68  Pulse: 86 73 74 75  Resp: 14  16 16   Temp:    98.3 F (36.8 C) 98.7 F (37.1 C)  TempSrc:   Oral Oral  SpO2: 100% 97% 99% 98%  Weight:      Height:        Intake/Output Summary (Last 24 hours) at 02/08/2017 1241 Last data filed at 02/07/2017 2143 Gross per 24 hour  Intake -  Output 1000 ml  Net -1000 ml   Filed Weights   02/06/17 2216  Weight: 78.8 kg (173 lb 11.6 oz)    Examination:   General exam: Alert, awake, oriented x 3 Respiratory system: Clear to auscultation. Respiratory effort normal. Cardiovascular system:RRR. No murmurs, rubs, gallops. Gastrointestinal system: Abdomen is nondistended, soft and nontender. No organomegaly or masses felt. Normal bowel sounds heard. Central nervous system: Alert and oriented. No focal neurological deficits. Extremities: No C/C/E, +pedal pulses Skin: No rashes, lesions or ulcers Psychiatry: Judgement and insight appear normal. Mood & affect appropriate.      Data Reviewed: I have personally reviewed following labs and imaging studies  CBC: Recent Labs  Lab 02/06/17 1551 02/06/17 2116 02/07/17 0230 02/08/17 0548  WBC 9.1 7.4 8.0 9.2  NEUTROABS 6.5  --   --   --   HGB 14.8 14.8 14.9 14.8  HCT 45.4 45.5 45.6 45.4  MCV 96.0 96.0 95.4 95.8  PLT 215 215 182 606   Basic Metabolic Panel: Recent Labs  Lab 02/06/17 1547 02/06/17 1551 02/08/17 0548  NA  --  136 144  K  --  4.1 3.9  CL  --  104 104  CO2  --  23 29  GLUCOSE  --  162* 101*  BUN  --  15 11  CREATININE  --  0.93 1.07  CALCIUM  --  8.9 9.1  MG 2.0  --   --    GFR: Estimated Creatinine Clearance: 66.6 mL/min (by C-G formula based on SCr of 1.07 mg/dL). Liver Function Tests: Recent Labs  Lab 02/06/17 1551  AST 31  ALT 26  ALKPHOS 79  BILITOT 0.4  PROT 7.0  ALBUMIN 3.8   Recent Labs  Lab 02/06/17 1551  LIPASE 31   No results for input(s): AMMONIA in the last 168 hours. Coagulation Profile: Recent Labs  Lab 02/06/17 1551  INR 1.25   Cardiac Enzymes: Recent Labs  Lab 02/06/17 1551  02/06/17 2116 02/07/17 0230  TROPONINI <0.03 <0.03 <0.03   BNP (last 3 results) No results for input(s): PROBNP in the last 8760 hours. HbA1C: No results for input(s): HGBA1C in the last 72 hours. CBG: No results for input(s): GLUCAP in the last 168 hours. Lipid Profile: No results for input(s): CHOL, HDL, LDLCALC, TRIG, CHOLHDL, LDLDIRECT in the last 72 hours. Thyroid Function Tests: No results for input(s): TSH, T4TOTAL, FREET4, T3FREE, THYROIDAB in the last 72 hours. Anemia Panel: No results for input(s): VITAMINB12, FOLATE, FERRITIN, TIBC, IRON, RETICCTPCT in the last 72 hours. Urine analysis:    Component Value Date/Time   COLORURINE YELLOW 02/06/2017 1730   APPEARANCEUR CLEAR 02/06/2017 1730   LABSPEC 1.009 02/06/2017 1730   PHURINE 7.0 02/06/2017 1730   GLUCOSEU NEGATIVE 02/06/2017 1730   HGBUR NEGATIVE 02/06/2017 1730   BILIRUBINUR NEGATIVE 02/06/2017 1730   KETONESUR NEGATIVE 02/06/2017 1730   PROTEINUR NEGATIVE 02/06/2017 1730   NITRITE NEGATIVE 02/06/2017 1730   LEUKOCYTESUR NEGATIVE 02/06/2017 1730   Sepsis Labs: @LABRCNTIP (procalcitonin:4,lacticidven:4)  ) Recent Results (from the past 240 hour(s))  Urine culture     Status: None   Collection Time: 02/06/17  5:30 PM  Result Value Ref Range Status   Specimen Description URINE, CLEAN CATCH  Final   Special Requests NONE  Final   Culture   Final    NO GROWTH Performed at [facility], [place] [address] [place], [place] [address]    Report Status 02/08/2017 FINAL  Final  MRSA PCR Screening     Status: None   Collection Time: 02/06/17  8:27 PM  Result Value Ref Range Status   MRSA by PCR NEGATIVE NEGATIVE Final    Comment:        The GeneXpert MRSA Assay (FDA approved for NASAL specimens only), is one component of a comprehensive MRSA colonization surveillance program. It is not intended to diagnose MRSA infection nor to guide or monitor treatment for MRSA infections.          Radiology  Studies: Ct Abdomen Pelvis Wo Contrast  Result Date: 02/06/2017 CLINICAL DATA:  67 year old with generalized weakness. Blood in stool. EXAM: CT ABDOMEN AND PELVIS WITHOUT CONTRAST TECHNIQUE: Multidetector CT imaging of the abdomen and pelvis was performed following the standard protocol without IV contrast. COMPARISON:  Abdominal CT 09/05/2015 and abdominal ultrasound 01/30/2017. FINDINGS: Lower chest: Median sternotomy wires are present. Lung bases are clear without pleural effusions. Coronary artery calcifications. Hepatobiliary: Liver is mildly heterogeneous but poorly characterized on this noncontrast examination. Gallbladder is decompressed and difficult to exclude any gallbladder pathology on this examination. Pancreas: Normal appearance of the pancreas without inflammation or duct dilatation. Spleen: Normal appearance of spleen without enlargement. Adrenals/Urinary Tract: Normal adrenal glands. 2 mm stone in right kidney mid/lower pole region without  hydronephrosis. Mild distention of the urinary bladder. Normal appearance of the left kidney without hydronephrosis or stones. Stomach/Bowel: Diverticulum in the distal duodenum. Colonic diverticulosis. Moderate amount of colonic stool. No focal bowel inflammation. Normal appendix. Previously, there was stranding in the central mesentery related to SMV thrombus. This central mesenteric edema has resolved. Cannot evaluate for portal or SMV thrombosis on this noncontrast examination. Vascular/Lymphatic: Atherosclerotic calcifications in the abdominal aorta without aneurysm. Small lymph nodes in the periaortic region but no significant change. No significant lymph node enlargement in the abdomen or pelvis. Reproductive: Prostate is prominent for size measuring 4.9 cm in transverse dimension. Other: No ascites. Negative for free air. Small umbilical hernia containing fat. Surgical clips in the lower right inguinal canal near the scrotum. Musculoskeletal: Again  noted is sclerosis with some collapse of the left femoral head. Prominent subchondral cystic formations in left femoral head as well. Findings compatible with avascular necrosis. IMPRESSION: Mild distention of the urinary bladder without significant hydronephrosis. Nonobstructive right kidney stone. Colonic diverticulosis without focal bowel inflammation. Chronic left femoral head avascular necrosis. Electronically Signed   By: Markus Daft M.D.   On: 02/06/2017 17:20   Dg Chest 2 View  Result Date: 02/06/2017 CLINICAL DATA:  Weakness.  Hypertension.  Dementia. EXAM: CHEST  2 VIEW COMPARISON:  09/06/2015 FINDINGS: Midline trachea. Normal heart size. Prior median sternotomy. No pleural effusion or pneumothorax. Clear lungs. IMPRESSION: No acute cardiopulmonary disease. Electronically Signed   By: Abigail Miyamoto M.D.   On: 02/06/2017 17:17        Scheduled Meds: . ALPRAZolam  1 mg Oral BID  . atorvastatin  10 mg Oral q1800  . buPROPion  50 mg Oral BID  . docusate sodium  100 mg Oral BID  . memantine  5 mg Oral BID  . pantoprazole (PROTONIX) IV  40 mg Intravenous Q24H  . polyethylene glycol  17 g Oral Daily  . QUEtiapine  100 mg Oral QHS   Continuous Infusions:   LOS: 0 days    Time spent: 25 minutes. Greater than 50% of this time was spent in direct contact with the patient coordinating care.     Lelon Frohlich, MD [facility] Pager [phone]  If 7PM-7AM, please contact night-coverage [email or web address] Password [facility] 02/08/2017, 12:41 PM

## 2017-02-08 NOTE — Progress Notes (Signed)
Pt ambulated in the hall to nurses station, down the hall and back to room.  Tolerated well.  Denies shortness of breath, dizziness.    Will continue to montior.

## 2017-02-09 ENCOUNTER — Observation Stay (HOSPITAL_BASED_OUTPATIENT_CLINIC_OR_DEPARTMENT_OTHER): Payer: Medicare Other

## 2017-02-09 ENCOUNTER — Encounter (HOSPITAL_COMMUNITY): Payer: Self-pay | Admitting: Student

## 2017-02-09 DIAGNOSIS — I951 Orthostatic hypotension: Secondary | ICD-10-CM | POA: Diagnosis not present

## 2017-02-09 DIAGNOSIS — I81 Portal vein thrombosis: Secondary | ICD-10-CM | POA: Diagnosis not present

## 2017-02-09 DIAGNOSIS — I499 Cardiac arrhythmia, unspecified: Secondary | ICD-10-CM | POA: Diagnosis not present

## 2017-02-09 DIAGNOSIS — R001 Bradycardia, unspecified: Secondary | ICD-10-CM

## 2017-02-09 DIAGNOSIS — K2950 Unspecified chronic gastritis without bleeding: Secondary | ICD-10-CM | POA: Diagnosis not present

## 2017-02-09 DIAGNOSIS — K625 Hemorrhage of anus and rectum: Secondary | ICD-10-CM | POA: Diagnosis not present

## 2017-02-09 DIAGNOSIS — R1310 Dysphagia, unspecified: Secondary | ICD-10-CM

## 2017-02-09 DIAGNOSIS — I25708 Atherosclerosis of coronary artery bypass graft(s), unspecified, with other forms of angina pectoris: Secondary | ICD-10-CM

## 2017-02-09 DIAGNOSIS — K921 Melena: Secondary | ICD-10-CM | POA: Diagnosis not present

## 2017-02-09 DIAGNOSIS — R531 Weakness: Secondary | ICD-10-CM | POA: Diagnosis not present

## 2017-02-09 DIAGNOSIS — K922 Gastrointestinal hemorrhage, unspecified: Secondary | ICD-10-CM | POA: Diagnosis not present

## 2017-02-09 LAB — ECHOCARDIOGRAM COMPLETE
HEIGHTINCHES: 68 in
WEIGHTICAEL: 2779.5597 [oz_av]

## 2017-02-09 MED ORDER — TRAZODONE HCL 50 MG PO TABS
50.0000 mg | ORAL_TABLET | Freq: Once | ORAL | Status: AC
  Administered 2017-02-09: 50 mg via ORAL
  Filled 2017-02-09: qty 1

## 2017-02-09 MED ORDER — PEG 3350-KCL-NA BICARB-NACL 420 G PO SOLR
2000.0000 mL | Freq: Once | ORAL | Status: AC
  Administered 2017-02-09: 2000 mL via ORAL
  Filled 2017-02-09: qty 4000

## 2017-02-09 MED ORDER — PEG 3350-KCL-NA BICARB-NACL 420 G PO SOLR
2000.0000 mL | Freq: Once | ORAL | Status: AC
  Administered 2017-02-10: 2000 mL via ORAL
  Filled 2017-02-09: qty 4000

## 2017-02-09 MED ORDER — ATORVASTATIN CALCIUM 20 MG PO TABS
20.0000 mg | ORAL_TABLET | Freq: Every day | ORAL | Status: DC
  Administered 2017-02-09 – 2017-02-11 (×3): 20 mg via ORAL
  Filled 2017-02-09 (×3): qty 1

## 2017-02-09 MED ORDER — ACETAMINOPHEN 325 MG PO TABS
650.0000 mg | ORAL_TABLET | Freq: Four times a day (QID) | ORAL | Status: DC | PRN
  Administered 2017-02-09 – 2017-02-10 (×2): 650 mg via ORAL
  Filled 2017-02-09 (×2): qty 2

## 2017-02-09 MED ORDER — ATORVASTATIN CALCIUM 40 MG PO TABS: 40.0000 mg | ORAL_TABLET | Freq: Every day | ORAL | Status: DC

## 2017-02-09 NOTE — Progress Notes (Signed)
    Subjective: No rectal bleeding since Saturday. Sat on toilet for an hour last night but finally had a large BM, no blood noted. No abdominal pain. No N/V. States he has dry mouth and sometimes feels food hanging in esophagus after eating.   Objective: Vital signs in last 24 hours: Temp:  [97.7 F (36.5 C)-98.1 F (36.7 C)] 97.7 F (36.5 C) (01/14 0700) Pulse Rate:  [61-90] 90 (01/14 0700) Resp:  [15-18] 18 (01/14 0700) BP: (115-148)/(57-86) 130/86 (01/14 0700) SpO2:  [98 %-100 %] 98 % (01/14 0700) Last BM Date: 02/08/17 General:   Alert and oriented, pleasant Head:  Normocephalic and atraumatic. Eyes:  No icterus, sclera clear. Conjuctiva pink.  Abdomen:  Bowel sounds present, soft, non-tender, non-distended. Extremities:  Without edema. Neurologic:  Alert and  oriented x4 Psych:  Alert and cooperative. Normal mood and affect.  Intake/Output from previous day: 01/13 0701 - 01/14 0700 In: -  Out: 800 [Urine:800] Intake/Output this shift: No intake/output data recorded.  Lab Results: Recent Labs    02/06/17 2116 02/07/17 0230 02/08/17 0548  WBC 7.4 8.0 9.2  HGB 14.8 14.9 14.8  HCT 45.5 45.6 45.4  PLT 215 182 223   BMET Recent Labs    02/06/17 1551 02/08/17 0548  NA 136 144  K 4.1 3.9  CL 104 104  CO2 23 29  GLUCOSE 162* 101*  BUN 15 11  CREATININE 0.93 1.07  CALCIUM 8.9 9.1   LFT Recent Labs    02/06/17 1551  PROT 7.0  ALBUMIN 3.8  AST 31  ALT 26  ALKPHOS 79  BILITOT 0.4   PT/INR Recent Labs    02/06/17 1551  LABPROT 15.6*  INR 1.25    Assessment: 67 year old male admitted to [facility] with episode of bradycardia, hypotension, rectal bleeding. Recently seen as outpatient by GI with plans for colonoscopy/EGD after cardiology evaluation. Chronically anticoagulated on Coumadin with history of SMV thrombosis Aug 2017. Coumadin on hold. No further rectal bleeding. Cardiology evaluation in process as of today.   Dysphagia: in setting of GERD, solid  food dysphagia noted. EGD/dilation prior to discharge and would need Propofol  Rectal bleeding: in setting of constipation and anticipate benign anorectal source but needs diagnostic colonoscopy in future prior to discharge. No prior colonoscopy. Hgb remains stable and normal.   Question of chronic liver disease, with laboratory indices normal. History of alcohol abuse in past. Hepatitis B and C negative, and at some point will need Hep A/B vaccinations as outpatient.   Await cardiology evaluation today. Remain on clear liquids for now. Consider TCS/EGD/dilation on 02/10/17 pending review of cardiology recommendations.   Plan: Coumadin on hold Protonix daily Colace BID and Miralax daily Await cardiology recommendations Colonoscopy/EGD/dilation with Propofol prior to discharge Remain on clear liquids now   Annitta Needs, PhD, ANP-BC [facility]    LOS: 0 days    02/09/2017, 7:47 AM

## 2017-02-09 NOTE — Progress Notes (Signed)
[facility] input.   Will arrange colonoscopy/EGD, possible dilation for tomorrow, 1/15 with Propofol by Dr. Oneida Alar. Continue clear liquids. Start spilt prep today. NPO after midnight. Remainder of prep first thing in the morning. Should be able to go home tomorrow afternoon barring any clinical changes.

## 2017-02-09 NOTE — Consult Note (Addendum)
Cardiology Consult    Patient ID: Larry Mullins; [id number]; Sep 20, 1950   Admit date: 02/06/2017 Date of Consult: 02/09/2017  Primary Care Provider: Redmond School, MD Primary Cardiologist: Scheduled to see Dr. Bronson Ing as an outpatient  Patient Profile    Larry Mullins is a 67 y.o. 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 Deniece Ree.   History of Present Illness    Larry Mullins presented to [facility] ED on 02/06/2017 for evaluation of worsening weakness and rectal bleeding over the past several days. His wife reports he is not overly active at baseline but over the past few days had been complaining of worsening weakness and increased rectal bleeding. He denies any recent chest pain, dyspnea on exertion, orthopnea, PND, or lower extremity edema. He does not exercise regularly but denies any anginal symptoms when performing ADL's. She checked his BP and HR over the past few days and noted his HR was in the 40's on the day she brought him to the ED. He had not been taking Atenolol prior to admission due to episodes of hypotension.  He underwent CABG in 1999 at [facility] and denies any interventions since. Has not been evaluated by Cardiology in 5+ years.   He had been referred to Cardiology in the outpatient setting for cardiac clearance prior to GI procedures (appointment scheduled in 02/2017). GI has been consulted and recommend EGD and Colonoscopy this admission, therefore Cardiology has been consulted for clearance. Coumadin has been held since admission and he denies any recurrence of bleeding.   Initial labs show WBC of 9.1, Hgb 14.8, platelets 215, Na+ 136, K+ 4.1, Mg 2.0, and creatinine 0.93. Initial and cyclic troponin values have been negative. CXR showing no acute  cardiopulmonary disease. CT Abdomen with nonobstructive right kidney stone, chronic diverticulosis without focal inflammation and chronic left femoral head avascular necrosis. Initial EKG showing sinus rhythm with ventricular bigeminy, HR 86, with no prior tracings available for comparison. Telemetry reviewed with HR being stable in the 60's to 90's with occasional episodes of ventricular bigeminy.    Past Medical History:  Diagnosis Date  . Avascular necrosis of bone of hip, left ([place])   . CAD (coronary artery disease)    a. s/p CABG in 1999.  [name] Dementia   . Diverticulosis   . Hyperlipidemia   . Hypertension   . Superior mesenteric vein thrombosis     Past Surgical History:  Procedure Laterality Date  . 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 no stents per patient     Home Medications:  Prior to Admission medications   Medication Sig Start Date End Date Taking? Authorizing Provider  alprazolam Duanne Moron) 2 MG tablet Take 1 tablet by mouth 4 (four) times daily. 08/07/15  Yes [provider]  aspirin EC 81 MG tablet Take 81 mg by mouth daily.   Yes [provider]  atenolol (TENORMIN) 25 MG tablet Take 25 mg by mouth at bedtime.   Yes [provider]  atorvastatin (LIPITOR) 10 MG tablet Take 10 mg by mouth daily.  08/07/15  Yes [provider]  buPROPion (WELLBUTRIN) 100 MG tablet Take 0.5 tablets by mouth 2 (two) times daily.  06/09/15  Yes [provider]  HYDROcodone-acetaminophen (NORCO) 10-325 MG tablet Take  1 tablet by mouth 4 (four) times daily as needed for moderate pain.  08/27/15  Yes [provider]  memantine (NAMENDA) 5 MG tablet Take 1 tablet by mouth 2 (two) times daily. 01/09/17  Yes [provider]  QUEtiapine (SEROQUEL) 100 MG tablet Take 100 mg by mouth at bedtime.   Yes [provider]  traMADol (ULTRAM) 50 MG tablet Take 1 tablet by mouth 4 (four) times daily.  08/07/15  Yes [provider]  warfarin (COUMADIN) 3 MG tablet Take 1 tablet (3 mg total) by mouth daily at 6 PM. 09/10/15  Yes Hosie Poisson, MD    Inpatient Medications: Scheduled Meds: . ALPRAZolam  1 mg Oral BID  . atorvastatin  20 mg Oral q1800  . buPROPion  50 mg Oral BID  . docusate sodium  100 mg Oral BID  . memantine  5 mg Oral BID  . pantoprazole (PROTONIX) IV  40 mg Intravenous Q24H  . polyethylene glycol  17 g Oral Daily  . QUEtiapine  100 mg Oral QHS   Continuous Infusions:  PRN Meds: acetaminophen  Allergies:    Allergies  Allergen Reactions  . Lopressor [Metoprolol Tartrate] Hives    Social History:   Social History   Socioeconomic History  . Marital status: Married    Spouse name: Not on file  . Number of children: Not on file  . Years of education: Not on file  . Highest education level: Not on file  Social Needs  . Financial resource strain: Not on file  . Food insecurity - worry: Not on file  . Food insecurity - inability: Not on file  . Transportation needs - medical: Not on file  . Transportation needs - non-medical: Not on file  Occupational History  . Not on file  Tobacco Use  . Smoking status: Former Smoker    Types: Cigarettes  . Smokeless tobacco: Former Systems developer    Quit date: 1999  Substance and Sexual Activity  . 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 Sexual activity: Not on file  Other Topics Concern  . Not on file  Social History Narrative  . Not on file     Family History:    Family History  Problem Relation Age of Onset  . Colon polyps Sister        unsure age of surgery, had to have colon resection   . Colon cancer Neg Hx   . Liver disease Neg Hx       Review of Systems    General:  No chills, fever, night sweats or weight changes.  Cardiovascular:  No chest pain, dyspnea on exertion, edema,  orthopnea, palpitations, paroxysmal nocturnal dyspnea. Dermatological: No rash, lesions/masses Respiratory: No cough, dyspnea Urologic: No hematuria, dysuria Abdominal:   No nausea, vomiting, diarrhea, melena, or hematemesis. Positive for BRBPR. Neurologic:  No visual changes, changes in mental status. Positive for weakness.   All other systems reviewed and are otherwise negative except as noted above.  Physical Exam/Data    Vitals:   02/08/17 1300 02/08/17 1608 02/08/17 2009 02/09/17 0700  BP: 115/66 121/64 (!) 148/57 130/86  Pulse: 61 89 90 90  Resp: 15  16 18   Temp: 98.1 F (36.7 C)  98.1 F (36.7 C) 97.7 F (36.5 C)  TempSrc:  Oral  Oral Oral  SpO2: 99% 100% 100% 98%  Weight:      Height:        Intake/Output Summary (Last 24 hours) at 02/09/2017 0926 Last data filed at 02/08/2017 1900 Gross per 24 hour  Intake -  Output 600 ml  Net -600 ml   Filed Weights   02/06/17 2216  Weight: 173 lb 11.6 oz (78.8 kg)   Body mass index is 26.41 kg/m.   General: Pleasant, Caucasian male appearing in NAD Psych: Normal affect. Neuro: Alert and oriented X 3. Moves all extremities spontaneously. HEENT: Normal  Neck: Supple without bruits or JVD. Lungs:  Resp regular and unlabored, CTA without wheezing or rales. Heart: RRR no s3, s4, or murmurs. Abdomen: Soft, non-tender, non-distended, BS + x 4.  Extremities: No clubbing, cyanosis or edema. DP/PT/Radials 2+ and equal bilaterally.   EKG:  The EKG was personally reviewed and demonstrates: Sinus rhythm with ventricular bigeminy, HR 86. (No prior tracings available for comparison.)   Telemetry:  Telemetry was personally reviewed and demonstrates: NSR, HR in 60's to 90's with frequent PVC's and occasional episodes of ventricular bigeminy.    Labs/Studies     Relevant CV Studies:  Echocardiogram: 08/2015 Study Conclusions  - Left ventricle: The cavity size was normal. There was mild focal   basal hypertrophy of the septum.  Systolic function was normal.   The estimated ejection fraction was in the range of 55% to 60%.   Wall motion was normal; there were no regional wall motion   abnormalities. Left ventricular diastolic function parameters   were normal. - Aortic valve: There was trivial regurgitation. - Mitral valve: Calcified annulus.  Impressions:  - Normal LV systolic function; trace AI, MR and TR.  Laboratory Data:  Chemistry Recent Labs  Lab 02/06/17 1551 02/08/17 0548  NA 136 144  K 4.1 3.9  CL 104 104  CO2 23 29  GLUCOSE 162* 101*  BUN 15 11  CREATININE 0.93 1.07  CALCIUM 8.9 9.1  GFRNONAA >60 >60  GFRAA >60 >60  ANIONGAP 9 11    Recent Labs  Lab 02/06/17 1551  PROT 7.0  ALBUMIN 3.8  AST 31  ALT 26  ALKPHOS 79  BILITOT 0.4   Hematology Recent Labs  Lab 02/06/17 2116 02/07/17 0230 02/08/17 0548  WBC 7.4 8.0 9.2  RBC 4.74 4.78 4.74  HGB 14.8 14.9 14.8  HCT 45.5 45.6 45.4  MCV 96.0 95.4 95.8  MCH 31.2 31.2 31.2  MCHC 32.5 32.7 32.6  RDW 13.4 13.4 13.4  PLT 215 182 223   Cardiac Enzymes Recent Labs  Lab 02/06/17 1551 02/06/17 2116 02/07/17 0230  TROPONINI <0.03 <0.03 <0.03   No results for input(s): TROPIPOC in the last 168 hours.  BNPNo results for input(s): BNP, PROBNP in the last 168 hours.  DDimer No results for input(s): DDIMER in the last 168 hours.  Radiology/Studies:  Ct Abdomen Pelvis Wo Contrast  Result Date: 02/06/2017 CLINICAL DATA:  67 year old with generalized weakness. Blood in stool. EXAM: CT ABDOMEN AND PELVIS WITHOUT CONTRAST TECHNIQUE: Multidetector CT imaging of the abdomen and pelvis was performed following the standard protocol without IV contrast. COMPARISON:  Abdominal CT 09/05/2015 and abdominal ultrasound 01/30/2017. FINDINGS: Lower chest: Median sternotomy wires are present. Lung bases are clear without pleural effusions. Coronary artery calcifications. Hepatobiliary: Liver is mildly heterogeneous but poorly characterized on this  noncontrast examination. Gallbladder is decompressed and difficult to exclude any gallbladder pathology on this examination. Pancreas: Normal appearance  of the pancreas without inflammation or duct dilatation. Spleen: Normal appearance of spleen without enlargement. Adrenals/Urinary Tract: Normal adrenal glands. 2 mm stone in right kidney mid/lower pole region without hydronephrosis. Mild distention of the urinary bladder. Normal appearance of the left kidney without hydronephrosis or stones. Stomach/Bowel: Diverticulum in the distal duodenum. Colonic diverticulosis. Moderate amount of colonic stool. No focal bowel inflammation. Normal appendix. Previously, there was stranding in the central mesentery related to SMV thrombus. This central mesenteric edema has resolved. Cannot evaluate for portal or SMV thrombosis on this noncontrast examination. Vascular/Lymphatic: Atherosclerotic calcifications in the abdominal aorta without aneurysm. Small lymph nodes in the periaortic region but no significant change. No significant lymph node enlargement in the abdomen or pelvis. Reproductive: Prostate is prominent for size measuring 4.9 cm in transverse dimension. Other: No ascites. Negative for free air. Small umbilical hernia containing fat. Surgical clips in the lower right inguinal canal near the scrotum. Musculoskeletal: Again noted is sclerosis with some collapse of the left femoral head. Prominent subchondral cystic formations in left femoral head as well. Findings compatible with avascular necrosis. IMPRESSION: Mild distention of the urinary bladder without significant hydronephrosis. Nonobstructive right kidney stone. Colonic diverticulosis without focal bowel inflammation. Chronic left femoral head avascular necrosis. Electronically Signed   By: Markus Daft M.D.   On: 02/06/2017 17:20   Dg Chest 2 View  Result Date: 02/06/2017 CLINICAL DATA:  Weakness.  Hypertension.  Dementia. EXAM: CHEST  2 VIEW COMPARISON:   09/06/2015 FINDINGS: Midline trachea. Normal heart size. Prior median sternotomy. No pleural effusion or pneumothorax. Clear lungs. IMPRESSION: No acute cardiopulmonary disease. Electronically Signed   By: Abigail Miyamoto M.D.   On: 02/06/2017 17:17     Assessment & Plan    1. Preoperative Cardiac Clearance For Endoscopy/Colonoscopy - the patient presented with worsening weakness and fatigue along with episodes of rectal bleeding over the past few months. GI is following and plans for a colonoscopy later this admission.  - he has a history of CAD, having required CABG in 1999. He denies any recent chest pain or dyspnea on exertion but is not active at baseline secondary to hip pain.  - EKG shows sinus rhythm with ventricular bigeminy, HR 86 (no prior tracings available for comparison). Telemetry reviewed with HR being stable in the 60's to 90's with occasional episodes of ventricular bigeminy.  - overall, the procedures are of lower risk from a cardiac perspective. His RCRI Risk is low at 0.9% of a major cardiac event. Will obtain a repeat echocardiogram to assess LV function and wall motion in the setting of his frequent ectopy. If no significant abnormalities noted, would not anticipate further preoperative testing. If found to have abnormalities, can consider a NST (unable to be performed today as the patient has already consumed breakfast).   2. CAD - s/p CABG in 1999. Most recent echo in 08/2015 showed a preserved EF of 55-60% with no regional WMA.  - BB held in setting of bradycardia and not on ASA secondary to the need for Coumadin. Continue statin therapy.   3. Bradycardia/ Ventricular Bigeminy - the patient's wife reports his HR was in the 40's when checked at home. EKG upon arrival to the ED showed SR with ventricular bigeminy. It is likely that his home BP cuff was not registering the ectopic beats.  - HR has been stable in the 60's to 90's since admission. K+ and Mg are WNL. Consider  restarting Atenolol at 12.5mg  daily in the setting of  his frequent ectopy.   4. HLD - only on Atorvastatin 10mg  daily PTA with most recent Lipid Panel in 11/2016 showing LDL of 120. Goal is < 70 with known CAD. Will titrate up to 20mg  daily. Will need repeat FLP and LFT's in 6-8 weeks.   5. Rectal Bleeding - GI following. Plan is for EGD and Colonoscopy later this admission.  - Hgb stable at 14.8 on most recent check.   6. Small mesenteric vein thrombosis  - on Coumadin PTA. Currently held due to GIB.    For questions or updates, please contact Larry Mullins Please consult [email or web address] for contact info under Cardiology/STEMI.  Signed, Larry Heritage, PA-C 02/09/2017, 9:26 AM Pager: [phone]  The patient was seen and examined, and I agree with the history, physical exam, assessment and plan as documented above, with modifications as noted below. I have also personally reviewed all relevant documentation, old records, labs, and both radiographic and cardiovascular studies. I have also independently interpreted old and new ECG's.  Briefly, 67 yr old male with h/o CABG in 1999 at [place] with recent rectal bleeding and weakness hospitalized for the same. His wife mentions she checked BP and HR on the day of admission and noted HR's in the 40 bpm range.  GI plans to proceed with EGD and colonoscopy. He used to be an alcoholic and has been sober for 6 years. He used to run 4 miles most days of the week and said his heart function was normal at time of CABG.  Hgb and troponins have been normal.  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  While he is not very active, he denies and chest pain or shortness of breath at rest. He also denies palpitations, leg swelling, orthopnea and PND.  Echocardiogram in August 2017 demonstrated normal cardiac function.  As stated above, endoscopic procedures such as  EGD and colonoscopy in and of themselves are low risk and require conscious sedation (rather than general anesthesia).  He has a low risk of major adverse cardiac events.  I feel he can safely proceed. Stress testing is not indicated at this time. An echocardiogram has been ordered which I will review. That being said, this should not preclude EGD and colonoscopy.  Kate Sable, MD, [facility]  02/09/2017 11:54 AM  ADDENDUM: I personally reviewed the echocardiogram which demonstrates normal LV systolic function with mild regional wall variation, LVEF 55-60%. As stated above, he can proceed with EGD and colonoscopy with an acceptable level of risk.

## 2017-02-09 NOTE — Progress Notes (Signed)
*  PRELIMINARY RESULTS* Echocardiogram 2D Echocardiogram has been performed.  Leavy Cella 02/09/2017, 2:26 PM

## 2017-02-09 NOTE — Progress Notes (Signed)
PROGRESS NOTE    Larry Mullins  QIH:[id number] DOB: 1950/07/08 DOA: 02/06/2017 PCP: Redmond School, MD     Brief Narrative:  67 year old man admitted from home on 1/11 after an episode of bright red blood per rectum.  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  Hemoglobin has remained stable, GI consult was requested and he was admitted to the hospital for further evaluation and management.  Scheduled for EGD/colonoscopy on 1/15.   Assessment & Plan:   Principal Problem:   GI bleed Active Problems:   Superior mesenteric vein thrombosis   Bradycardia   Melena   Orthostatic hypotension   Dysphagia   Bright red blood per rectum -Etiology unclear, of note he is on chronic opioids and was constipated and he self disimpacted, probably causing some manual trauma. -Okay to continue Protonix for now, CBC has been stable, no need for blood transfusion. -no further bleeding since admission. -Appreciate GI input and recommendations. -They are recommending EGD and colonoscopy this admission that will be performed on 1/15.- Also recommend holding Coumadin for now.  Bradycardia -Was in the 86s on admission increased to 60s.  And now up to 80s-90s. -Agree with holding atenolol for now.  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 -Anticoagulation remains on hold for now. -Of note his INR was subtherapeutic on admission.   DVT prophylaxis: SCDs Code Status: Full code Family Communication: Patient only Disposition Plan: Anticipate at least 24-48 more hours in the hospital  Consultants:   GI  Procedures:   None  Antimicrobials:  Anti-infectives (From admission, onward)   None       Subjective: No new events. No further bleeding.  Objective: Vitals:   02/08/17 1300 02/08/17 1608 02/08/17 2009 02/09/17 0700  BP: 115/66 121/64  (!) 148/57 130/86  Pulse: 61 89 90 90  Resp: 15  16 18   Temp: 98.1 F (36.7 C)  98.1 F (36.7 C) 97.7 F (36.5 C)  TempSrc: Oral  Oral Oral  SpO2: 99% 100% 100% 98%  Weight:      Height:        Intake/Output Summary (Last 24 hours) at 02/09/2017 1404 Last data filed at 02/08/2017 1900 Gross per 24 hour  Intake -  Output 600 ml  Net -600 ml   Filed Weights   02/06/17 2216  Weight: 78.8 kg (173 lb 11.6 oz)    Examination:   General exam: Alert, awake, oriented x 3 Respiratory system: Clear to auscultation. Respiratory effort normal. Cardiovascular system:RRR. No murmurs, rubs, gallops. Gastrointestinal system: Abdomen is nondistended, soft and nontender. No organomegaly or masses felt. Normal bowel sounds heard. Central nervous system: Alert and oriented. No focal neurological deficits. Extremities: No C/C/E, +pedal pulses Skin: No rashes, lesions or ulcers Psychiatry: Judgement and insight appear normal. Mood & affect appropriate.      Data Reviewed: I have personally reviewed following labs and imaging studies  CBC: Recent Labs  Lab 02/06/17 1551 02/06/17 2116 02/07/17 0230 02/08/17 0548  WBC 9.1 7.4 8.0 9.2  NEUTROABS 6.5  --   --   --   HGB 14.8 14.8 14.9 14.8  HCT 45.4 45.5 45.6 45.4  MCV 96.0 96.0 95.4 95.8  PLT 215 215 182 875   Basic Metabolic Panel: Recent Labs  Lab 02/06/17 1547 02/06/17 1551 02/08/17 0548  NA  --  136 144  K  --  4.1 3.9  CL  --  104 104  CO2  --  23 29  GLUCOSE  --  162* 101*  BUN  --  15 11  CREATININE  --  0.93 1.07  CALCIUM  --  8.9 9.1  MG 2.0  --   --    GFR: Estimated Creatinine Clearance: 66.6 mL/min (by C-G formula based on SCr of 1.07 mg/dL). Liver Function Tests: Recent Labs  Lab 02/06/17 1551  AST 31  ALT 26  ALKPHOS 79  BILITOT 0.4  PROT 7.0  ALBUMIN 3.8   Recent Labs  Lab 02/06/17 1551  LIPASE 31   No results for input(s): AMMONIA in the last 168 hours. Coagulation Profile: Recent Labs    Lab 02/06/17 1551  INR 1.25   Cardiac Enzymes: Recent Labs  Lab 02/06/17 1551 02/06/17 2116 02/07/17 0230  TROPONINI <0.03 <0.03 <0.03   BNP (last 3 results) No results for input(s): PROBNP in the last 8760 hours. HbA1C: No results for input(s): HGBA1C in the last 72 hours. CBG: No results for input(s): GLUCAP in the last 168 hours. Lipid Profile: No results for input(s): CHOL, HDL, LDLCALC, TRIG, CHOLHDL, LDLDIRECT in the last 72 hours. Thyroid Function Tests: No results for input(s): TSH, T4TOTAL, FREET4, T3FREE, THYROIDAB in the last 72 hours. Anemia Panel: No results for input(s): VITAMINB12, FOLATE, FERRITIN, TIBC, IRON, RETICCTPCT in the last 72 hours. Urine analysis:    Component Value Date/Time   COLORURINE YELLOW 02/06/2017 1730   APPEARANCEUR CLEAR 02/06/2017 1730   LABSPEC 1.009 02/06/2017 1730   PHURINE 7.0 02/06/2017 1730   GLUCOSEU NEGATIVE 02/06/2017 1730   HGBUR NEGATIVE 02/06/2017 1730   BILIRUBINUR NEGATIVE 02/06/2017 1730   KETONESUR NEGATIVE 02/06/2017 1730   PROTEINUR NEGATIVE 02/06/2017 1730   NITRITE NEGATIVE 02/06/2017 1730   LEUKOCYTESUR NEGATIVE 02/06/2017 1730   Sepsis Labs: @LABRCNTIP (procalcitonin:4,lacticidven:4)  ) Recent Results (from the past 240 hour(s))  Urine culture     Status: None   Collection Time: 02/06/17  5:30 PM  Result Value Ref Range Status   Specimen Description URINE, CLEAN CATCH  Final   Special Requests NONE  Final   Culture   Final    NO GROWTH Performed at [facility], [place] [address] [place], [place] [address]    Report Status 02/08/2017 FINAL  Final  MRSA PCR Screening     Status: None   Collection Time: 02/06/17  8:27 PM  Result Value Ref Range Status   MRSA by PCR NEGATIVE NEGATIVE Final    Comment:        The GeneXpert MRSA Assay (FDA approved for NASAL specimens only), is one component of a comprehensive MRSA colonization surveillance program. It is not intended to diagnose  MRSA infection nor to guide or monitor treatment for MRSA infections.          Radiology Studies: No results found.      Scheduled Meds: . ALPRAZolam  1 mg Oral BID  . atorvastatin  20 mg Oral q1800  . buPROPion  50 mg Oral BID  . docusate sodium  100 mg Oral BID  . memantine  5 mg Oral BID  . pantoprazole (PROTONIX) IV  40 mg Intravenous Q24H  . polyethylene glycol  17 g Oral Daily  . polyethylene glycol-electrolytes  2,000 mL Oral Once  . [START ON 02/10/2017] polyethylene glycol-electrolytes  2,000 mL Oral Once  . QUEtiapine  100 mg Oral QHS   Continuous Infusions:   LOS: 0 days  Time spent: 25 minutes. Greater than 50% of this time was spent in direct contact with the patient coordinating care.     Lelon Frohlich, MD [facility] Pager [phone]  If 7PM-7AM, please contact night-coverage [email or web address] Password [facility] 02/09/2017, 2:04 PM

## 2017-02-10 ENCOUNTER — Encounter (HOSPITAL_COMMUNITY): Payer: Self-pay | Admitting: *Deleted

## 2017-02-10 ENCOUNTER — Observation Stay (HOSPITAL_COMMUNITY): Payer: Medicare Other | Admitting: Anesthesiology

## 2017-02-10 ENCOUNTER — Encounter (HOSPITAL_COMMUNITY)
Admission: EM | Disposition: A | Discharge status: Home / Self Care | Payer: Self-pay | Source: Home / Self Care | Attending: Emergency Medicine

## 2017-02-10 DIAGNOSIS — K644 Residual hemorrhoidal skin tags: Secondary | ICD-10-CM | POA: Diagnosis not present

## 2017-02-10 DIAGNOSIS — K2950 Unspecified chronic gastritis without bleeding: Secondary | ICD-10-CM | POA: Diagnosis not present

## 2017-02-10 DIAGNOSIS — R1310 Dysphagia, unspecified: Secondary | ICD-10-CM | POA: Diagnosis not present

## 2017-02-10 DIAGNOSIS — K222 Esophageal obstruction: Secondary | ICD-10-CM | POA: Diagnosis not present

## 2017-02-10 DIAGNOSIS — R001 Bradycardia, unspecified: Secondary | ICD-10-CM | POA: Diagnosis not present

## 2017-02-10 DIAGNOSIS — K921 Melena: Secondary | ICD-10-CM | POA: Diagnosis not present

## 2017-02-10 DIAGNOSIS — B9681 Helicobacter pylori [H. pylori] as the cause of diseases classified elsewhere: Secondary | ICD-10-CM | POA: Diagnosis not present

## 2017-02-10 DIAGNOSIS — K2930 Chronic superficial gastritis without bleeding: Secondary | ICD-10-CM | POA: Diagnosis not present

## 2017-02-10 DIAGNOSIS — K5730 Diverticulosis of large intestine without perforation or abscess without bleeding: Secondary | ICD-10-CM | POA: Diagnosis not present

## 2017-02-10 DIAGNOSIS — K7460 Unspecified cirrhosis of liver: Secondary | ICD-10-CM | POA: Diagnosis not present

## 2017-02-10 DIAGNOSIS — K625 Hemorrhage of anus and rectum: Secondary | ICD-10-CM

## 2017-02-10 DIAGNOSIS — F0390 Unspecified dementia without behavioral disturbance: Secondary | ICD-10-CM | POA: Diagnosis not present

## 2017-02-10 DIAGNOSIS — K648 Other hemorrhoids: Secondary | ICD-10-CM | POA: Diagnosis not present

## 2017-02-10 DIAGNOSIS — I10 Essential (primary) hypertension: Secondary | ICD-10-CM | POA: Diagnosis not present

## 2017-02-10 DIAGNOSIS — K602 Anal fissure, unspecified: Secondary | ICD-10-CM | POA: Diagnosis not present

## 2017-02-10 HISTORY — PX: ESOPHAGOGASTRODUODENOSCOPY (EGD) WITH PROPOFOL: SHX5813

## 2017-02-10 HISTORY — PX: BIOPSY: SHX5522

## 2017-02-10 HISTORY — PX: COLONOSCOPY WITH PROPOFOL: SHX5780

## 2017-02-10 SURGERY — COLONOSCOPY WITH PROPOFOL
Anesthesia: Monitor Anesthesia Care

## 2017-02-10 MED ORDER — LIDOCAINE VISCOUS 2 % MT SOLN
5.0000 mL | Freq: Once | OROMUCOSAL | Status: AC
  Administered 2017-02-10: 5 mL via OROMUCOSAL

## 2017-02-10 MED ORDER — PROPOFOL 10 MG/ML IV BOLUS
INTRAVENOUS | Status: AC
  Filled 2017-02-10: qty 40

## 2017-02-10 MED ORDER — FENTANYL CITRATE (PF) 100 MCG/2ML IJ SOLN
25.0000 ug | Freq: Once | INTRAMUSCULAR | Status: AC
  Administered 2017-02-10: 25 ug via INTRAVENOUS

## 2017-02-10 MED ORDER — LIDOCAINE VISCOUS 2 % MT SOLN
OROMUCOSAL | Status: AC
  Filled 2017-02-10: qty 15

## 2017-02-10 MED ORDER — PROPOFOL 10 MG/ML IV BOLUS
INTRAVENOUS | Status: AC
  Filled 2017-02-10: qty 20

## 2017-02-10 MED ORDER — LACTATED RINGERS IV SOLN
INTRAVENOUS | Status: DC
  Administered 2017-02-10: 15:00:00 via INTRAVENOUS

## 2017-02-10 MED ORDER — PROPOFOL 500 MG/50ML IV EMUL
INTRAVENOUS | Status: DC | PRN
  Administered 2017-02-10 (×2): via INTRAVENOUS
  Administered 2017-02-10: 150 ug/kg/min via INTRAVENOUS

## 2017-02-10 MED ORDER — TRAZODONE HCL 50 MG PO TABS
50.0000 mg | ORAL_TABLET | Freq: Every evening | ORAL | Status: DC | PRN
  Administered 2017-02-10 – 2017-02-11 (×2): 50 mg via ORAL
  Filled 2017-02-10 (×3): qty 1

## 2017-02-10 MED ORDER — MINERAL OIL PO OIL
TOPICAL_OIL | ORAL | Status: AC
  Filled 2017-02-10: qty 30

## 2017-02-10 MED ORDER — SODIUM CHLORIDE 0.9 % IV SOLN: INTRAVENOUS | Status: DC

## 2017-02-10 MED ORDER — FENTANYL CITRATE (PF) 100 MCG/2ML IJ SOLN
INTRAMUSCULAR | Status: AC
Start: 2017-02-10 — End: 2017-02-10
  Filled 2017-02-10: qty 2

## 2017-02-10 MED ORDER — MIDAZOLAM HCL 2 MG/2ML IJ SOLN
1.0000 mg | INTRAMUSCULAR | Status: AC
  Administered 2017-02-10: 2 mg via INTRAVENOUS

## 2017-02-10 MED ORDER — MIDAZOLAM HCL 2 MG/2ML IJ SOLN
INTRAMUSCULAR | Status: AC
  Filled 2017-02-10: qty 2

## 2017-02-10 MED ORDER — HYDROCORTISONE 2.5 % RE CREA
TOPICAL_CREAM | Freq: Four times a day (QID) | RECTAL | Status: DC
  Administered 2017-02-10: 22:00:00 via RECTAL
  Filled 2017-02-10: qty 28.35

## 2017-02-10 MED ORDER — PANTOPRAZOLE SODIUM 40 MG PO TBEC
40.0000 mg | DELAYED_RELEASE_TABLET | Freq: Two times a day (BID) | ORAL | Status: DC
  Administered 2017-02-10 – 2017-02-12 (×3): 40 mg via ORAL
  Filled 2017-02-10 (×3): qty 1

## 2017-02-10 SURGICAL SUPPLY — 24 items
AMPLIFEYE LARGE BLUE (MISCELLANEOUS) ×4 IMPLANT
BLOCK BITE 60FR ADLT L/F BLUE (MISCELLANEOUS) IMPLANT
ELECT REM PT RETURN 9FT ADLT (ELECTROSURGICAL)
ELECTRODE REM PT RTRN 9FT ADLT (ELECTROSURGICAL) IMPLANT
FCP BXJMBJMB 240X2.8X (CUTTING FORCEPS)
FLOOR PAD 36X40 (MISCELLANEOUS) ×4
FORCEPS BIOP RAD 4 LRG CAP 4 (CUTTING FORCEPS) IMPLANT
FORCEPS BIOP RJ4 240 W/NDL (CUTTING FORCEPS)
FORCEPS BXJMBJMB 240X2.8X (CUTTING FORCEPS) IMPLANT
FORMALIN 10 PREFIL 20ML (MISCELLANEOUS) IMPLANT
INJECTOR/SNARE I SNARE (MISCELLANEOUS) IMPLANT
KIT ENDO PROCEDURE PEN (KITS) ×4 IMPLANT
MANIFOLD NEPTUNE II (INSTRUMENTS) ×4 IMPLANT
NEEDLE SCLEROTHERAPY 25GX240 (NEEDLE) IMPLANT
PAD FLOOR 36X40 (MISCELLANEOUS) ×2 IMPLANT
PROBE APC STR FIRE (PROBE) IMPLANT
PROBE INJECTION GOLD (MISCELLANEOUS)
PROBE INJECTION GOLD 7FR (MISCELLANEOUS) IMPLANT
SNARE SHORT THROW 13M SML OVAL (MISCELLANEOUS) ×4 IMPLANT
SYR INFLATION 60ML (SYRINGE) IMPLANT
TRAP SPECIMEN MUCOUS 40CC (MISCELLANEOUS) IMPLANT
TUBING INSUFFLATOR CO2MPACT (TUBING) ×4 IMPLANT
TUBING IRRIGATION ENDOGATOR (MISCELLANEOUS) IMPLANT
WATER STERILE IRR 1000ML POUR (IV SOLUTION) IMPLANT

## 2017-02-10 NOTE — Anesthesia Preprocedure Evaluation (Signed)
Anesthesia Evaluation  Patient identified by MRN, date of birth, ID band Patient awake    Reviewed: Allergy & Precautions, NPO status , Patient's Chart, lab work & pertinent test results  Airway Mallampati: I  TM Distance: >3 FB Neck ROM: Full    Dental  (+) Teeth Intact   Pulmonary former smoker,    breath sounds clear to auscultation       Cardiovascular hypertension, (-) angina+ CAD and + CABG   Rhythm:Regular Rate:Normal     Neuro/Psych PSYCHIATRIC DISORDERS    GI/Hepatic (+) Cirrhosis       , Hx SMA thrombosis   Endo/Other    Renal/GU      Musculoskeletal AVN L Hip    Abdominal   Peds  Hematology   Anesthesia Other Findings   Reproductive/Obstetrics                             Anesthesia Physical Anesthesia Plan  ASA: III  Anesthesia Plan: MAC   Post-op Pain Management:    Induction: Intravenous  PONV Risk Score and Plan:   Airway Management Planned: Simple Face Mask  Additional Equipment:   Intra-op Plan:   Post-operative Plan:   Informed Consent: I have reviewed the patients History and Physical, chart, labs and discussed the procedure including the risks, benefits and alternatives for the proposed anesthesia with the patient or authorized representative who has indicated his/her understanding and acceptance.     Plan Discussed with:   Anesthesia Plan Comments:         Anesthesia Quick Evaluation

## 2017-02-10 NOTE — Progress Notes (Signed)
PROGRESS NOTE    Larry Mullins  JOA:[id number] DOB: 1951/01/24 DOA: 02/06/2017 PCP: Redmond School, MD     Brief Narrative:  67 year old man admitted from home on 1/11 after an episode of bright red blood per rectum.  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  Hemoglobin has remained stable, GI consult was requested and he was admitted to the hospital for further evaluation and management.  Scheduled for EGD/colonoscopy on 1/15.  Found to have on colonoscopy diverticulosis and hemorrhoids which are likely the cause for bleeding.  GI has consulted surgery for inpatient hemorrhoidectomy.  This is planned for 1/16.   Assessment & Plan:   Principal Problem:   GI bleed Active Problems:   Superior mesenteric vein thrombosis   Bradycardia   Melena   Orthostatic hypotension   Dysphagia   Chronic superficial gastritis without bleeding   Bright red blood per rectum -Etiology unclear, of note he is on chronic opioids and was constipated and he self disimpacted, probably causing some manual trauma. -Okay to continue Protonix for now, CBC has been stable, no need for blood transfusion. -no further bleeding since admission. -Appreciate GI input and recommendations. -EGD and colonoscopy performed today: See note by Dr. Oneida Alar for complete details but in brief patient was noted to have pandiverticulosis as well as hemorrhoids. -Plan for hemorrhoidectomy on 1/16 with Dr. Arnoldo Morale, will keep n.p.o. after midnight.  Bradycardia -Was in the 69s on admission increased to 60s.  And now up to 80s-90s. -Agree with holding atenolol for now.  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 -Anticoagulation remains on hold for now. -Of note his INR was subtherapeutic on admission.   DVT prophylaxis: SCDs Code Status: Full code Family Communication: Wife at  bedside updated on plan of care and questions answered Disposition Plan: If hemorrhoid surgery goes well, can likely discharge home tomorrow afternoon.  Consultants:   GI  General surgery  Procedures:   EGD/colonoscopy on 1/15  Antimicrobials:  Anti-infectives (From admission, onward)   None       Subjective: No overnight events, no complaints, no issues.  No further rectal bleeding.  Objective: Vitals:   02/10/17 1609 02/10/17 1615 02/10/17 1630 02/10/17 1649  BP: 94/66 112/68 116/70 140/61  Pulse: 68 (!) 58 (!) 58 (!) 51  Resp: 13 (!) 7 (!) 7 18  Temp: 97.8 F (36.6 C)  97.8 F (36.6 C) 97.8 F (36.6 C)  TempSrc:      SpO2: 96% 100% 99% 98%  Weight:      Height:        Intake/Output Summary (Last 24 hours) at 02/10/2017 1825 Last data filed at 02/10/2017 1636 Gross per 24 hour  Intake 2550 ml  Output 0 ml  Net 2550 ml   Filed Weights   02/06/17 2216  Weight: 78.8 kg (173 lb 11.6 oz)    Examination:   General exam: Alert, awake, oriented x 3 Respiratory system: Clear to auscultation. Respiratory effort normal. Cardiovascular system:RRR. No murmurs, rubs, gallops. Gastrointestinal system: Abdomen is nondistended, soft and nontender. No organomegaly or masses felt. Normal bowel sounds heard. Central nervous system: Alert and oriented. No focal neurological deficits. Extremities: No C/C/E, +pedal pulses Skin: No rashes, lesions or ulcers Psychiatry: Judgement and insight appear normal. Mood & affect appropriate.      Data Reviewed: I have personally reviewed following labs and imaging studies  CBC: Recent  Labs  Lab 02/06/17 1551 02/06/17 2116 02/07/17 0230 02/08/17 0548  WBC 9.1 7.4 8.0 9.2  NEUTROABS 6.5  --   --   --   HGB 14.8 14.8 14.9 14.8  HCT 45.4 45.5 45.6 45.4  MCV 96.0 96.0 95.4 95.8  PLT 215 215 182 409   Basic Metabolic Panel: Recent Labs  Lab 02/06/17 1547 02/06/17 1551 02/08/17 0548  NA  --  136 144  K  --  4.1 3.9    CL  --  104 104  CO2  --  23 29  GLUCOSE  --  162* 101*  BUN  --  15 11  CREATININE  --  0.93 1.07  CALCIUM  --  8.9 9.1  MG 2.0  --   --    GFR: Estimated Creatinine Clearance: 66.6 mL/min (by C-G formula based on SCr of 1.07 mg/dL). Liver Function Tests: Recent Labs  Lab 02/06/17 1551  AST 31  ALT 26  ALKPHOS 79  BILITOT 0.4  PROT 7.0  ALBUMIN 3.8   Recent Labs  Lab 02/06/17 1551  LIPASE 31   No results for input(s): AMMONIA in the last 168 hours. Coagulation Profile: Recent Labs  Lab 02/06/17 1551  INR 1.25   Cardiac Enzymes: Recent Labs  Lab 02/06/17 1551 02/06/17 2116 02/07/17 0230  TROPONINI <0.03 <0.03 <0.03   BNP (last 3 results) No results for input(s): PROBNP in the last 8760 hours. HbA1C: No results for input(s): HGBA1C in the last 72 hours. CBG: No results for input(s): GLUCAP in the last 168 hours. Lipid Profile: No results for input(s): CHOL, HDL, LDLCALC, TRIG, CHOLHDL, LDLDIRECT in the last 72 hours. Thyroid Function Tests: No results for input(s): TSH, T4TOTAL, FREET4, T3FREE, THYROIDAB in the last 72 hours. Anemia Panel: No results for input(s): VITAMINB12, FOLATE, FERRITIN, TIBC, IRON, RETICCTPCT in the last 72 hours. Urine analysis:    Component Value Date/Time   COLORURINE YELLOW 02/06/2017 1730   APPEARANCEUR CLEAR 02/06/2017 1730   LABSPEC 1.009 02/06/2017 1730   PHURINE 7.0 02/06/2017 1730   GLUCOSEU NEGATIVE 02/06/2017 1730   HGBUR NEGATIVE 02/06/2017 1730   BILIRUBINUR NEGATIVE 02/06/2017 1730   KETONESUR NEGATIVE 02/06/2017 1730   PROTEINUR NEGATIVE 02/06/2017 1730   NITRITE NEGATIVE 02/06/2017 1730   LEUKOCYTESUR NEGATIVE 02/06/2017 1730   Sepsis Labs: @LABRCNTIP (procalcitonin:4,lacticidven:4)  ) Recent Results (from the past 240 hour(s))  Urine culture     Status: None   Collection Time: 02/06/17  5:30 PM  Result Value Ref Range Status   Specimen Description URINE, CLEAN CATCH  Final   Special Requests NONE   Final   Culture   Final    NO GROWTH Performed at [facility], [place] [address] [place], [place] [address]    Report Status 02/08/2017 FINAL  Final  MRSA PCR Screening     Status: None   Collection Time: 02/06/17  8:27 PM  Result Value Ref Range Status   MRSA by PCR NEGATIVE NEGATIVE Final    Comment:        The GeneXpert MRSA Assay (FDA approved for NASAL specimens only), is one component of a comprehensive MRSA colonization surveillance program. It is not intended to diagnose MRSA infection nor to guide or monitor treatment for MRSA infections.          Radiology Studies: No results found.      Scheduled Meds: . ALPRAZolam  1 mg Oral BID  . atorvastatin  20 mg Oral q1800  .  buPROPion  50 mg Oral BID  . docusate sodium  100 mg Oral BID  . hydrocortisone   Rectal QID  . memantine  5 mg Oral BID  . pantoprazole  40 mg Oral BID AC  . polyethylene glycol  17 g Oral Daily  . QUEtiapine  100 mg Oral QHS   Continuous Infusions:   LOS: 0 days    Time spent: 25 minutes. Greater than 50% of this time was spent in direct contact with the patient coordinating care.     Lelon Frohlich, MD [facility] Pager [phone]  If 7PM-7AM, please contact night-coverage [email or web address] Password [facility] 02/10/2017, 6:25 PM

## 2017-02-10 NOTE — Op Note (Signed)
[facility] Patient Name: Larry Mullins Procedure Date: 02/10/2017 3:50 PM MRN: [id number] Date of Birth: 1951-01-28 Attending MD: Barney Drain MD, MD CSN: [id number] Age: 67 Admit Type: Outpatient Procedure:                Upper GI endoscopy WITH COLD FORCEPS                            BIOPSY/ESOPHAGEAL DILATION Indications:              Dysphagia Providers:                Barney Drain MD, MD, Janeece Riggers, RN, Bonnetta Barry,                            Technician Referring MD:             Redmond School, MD Medicines:                Propofol per Anesthesia Complications:            No immediate complications. Estimated Blood Loss:     Estimated blood loss was minimal. Procedure:                Pre-Anesthesia Assessment:                           - Prior to the procedure, a History and Physical                            was performed, and patient medications and                            allergies were reviewed. The patient's tolerance of                            previous anesthesia was also reviewed. The risks                            and benefits of the procedure and the sedation                            options and risks were discussed with the patient.                            All questions were answered, and informed consent                            was obtained. Prior Anticoagulants: The patient has                            taken Coumadin (warfarin), last dose was 4 days                            prior to procedure. ASA Grade Assessment: II - A  patient with mild systemic disease. After reviewing                            the risks and benefits, the patient was deemed in                            satisfactory condition to undergo the procedure.                            After obtaining informed consent, the endoscope was                            passed under direct vision. Throughout the                            procedure,  the patient's blood pressure, pulse, and                            oxygen saturations were monitored continuously. The                            [id number] ([id number]) scope was introduced through the                            mouth, and advanced to the second part of duodenum.                            The upper GI endoscopy was accomplished without                            difficulty. The patient tolerated the procedure                            well. Scope In: 3:52:51 PM Scope Out: 4:00:10 PM Total Procedure Duration: 0 hours 7 minutes 19 seconds  Findings:      A widely patent Schatzki ring (acquired) was found at the       gastroesophageal junction. A guidewire was placed and the scope was       withdrawn. Dilation was performed with a Savary dilator with moderate       resistance at 16 mm and 17 mm. Estimated blood loss: none.      Diffuse moderate inflammation characterized by congestion (edema) and       erythema was found in the entire examined stomach. Biopsies were taken       with a cold forceps for Helicobacter pylori testing.      The examined duodenum was normal. Impression:               - Widely patent Schatzki ring. Dilated.                           - MODERATE Gastritis. Biopsied. Moderate Sedation:      Per Anesthesia Care Recommendation:           - Await pathology results.                           -  Continue present medications. RESUME                            ANTICOAGULATION TODAY.                           - Resume previous diet.                           - Return to my office in 3 months. CONSIDER                            MANOMTERY/IMPEDANCE IF DYSPHAGIA NOT IMPROVED AFTER                            DILATION.                           - Return patient to hospital ward for ongoing care. Procedure Code(s):        --- Professional ---                           [phone], Esophagogastroduodenoscopy, flexible,                            transoral; with  insertion of guide wire followed by                            passage of dilator(s) through esophagus over guide                            wire                           43239, Esophagogastroduodenoscopy, flexible,                            transoral; with biopsy, single or multiple Diagnosis Code(s):        --- Professional ---                           K22.2, Esophageal obstruction                           K29.70, Gastritis, unspecified, without bleeding                           R13.10, Dysphagia, unspecified CPT copyright 2016 American Medical Association. All rights reserved. The codes documented in this report are preliminary and upon coder review may  be revised to meet current compliance requirements. Barney Drain, MD Barney Drain MD, MD 02/10/2017 4:08:50 PM This report has been signed electronically. Number of Addenda: 0

## 2017-02-10 NOTE — Progress Notes (Signed)
REVIEWED CASE WITH DR. Arnoldo Morale. PT SHOULD BENEFIT FROM HEMORRHOIDECTOMY. HE WILL SEE IN AM AND PLAN FOR HEMORRHOIDECTOMY. CONSULT ORDERED.

## 2017-02-10 NOTE — Progress Notes (Signed)
Discussed with nursing staff. Completed prep. Output clear. NPO. Coumadin has been on hold since admission 1/11. INR on admission 1.25. Colonoscopy for hematochezia, EGD/dilation for dysphagia with Propofol planned for today. CT last year with question of possible cirrhosis but recent ultrasound with mildly increased hepatic echogenicity, no obvious nodularity, no splenomegaly.Llaboratory indices not indicative of advanced liver disease. Negative viral serologies, LFTs normal.   Anticipate discharge this afternoon barring any clinical changes.

## 2017-02-10 NOTE — Anesthesia Postprocedure Evaluation (Signed)
Anesthesia Post Note  Patient: MACHI WHITTAKER  Procedure(s) Performed: COLONOSCOPY WITH PROPOFOL (N/A ) ESOPHAGOGASTRODUODENOSCOPY (EGD) WITH PROPOFOL (N/A ) BIOPSY  Patient location during evaluation: PACU Anesthesia Type: MAC Level of consciousness: awake and alert, oriented and patient cooperative Pain management: pain level controlled Vital Signs Assessment: post-procedure vital signs reviewed and stable Respiratory status: spontaneous breathing and respiratory function stable Cardiovascular status: stable Postop Assessment: no apparent nausea or vomiting Anesthetic complications: no     Last Vitals:  Vitals:   02/10/17 1500 02/10/17 1609  BP:  94/66  Pulse:  68  Resp: 16 13  Temp:  36.6 C  SpO2: 94% 96%    Last Pain:  Vitals:   02/10/17 1409  TempSrc: Oral  PainSc:                  ADAMS, AMY A

## 2017-02-10 NOTE — Op Note (Signed)
[facility] Patient Name: Larry Mullins Procedure Date: 02/10/2017 3:06 PM MRN: [id number] Date of Birth: [birth year] Attending MD: Barney Drain MD, MD CSN: [id number] Age: 67 Admit Type: Outpatient Procedure:                Colonoscopy, DIAGNOSTIC Indications:              Hematochezia Providers:                Barney Drain MD, MD, Otis Peak B. Sharon Seller, RN, Nelma Rothman, Technician, Randa Spike, Technician Referring MD:             Redmond School, MD Medicines:                Propofol per Anesthesia Complications:            No immediate complications. Estimated Blood Loss:     Estimated blood loss: none. Procedure:                Pre-Anesthesia Assessment:                           - Prior to the procedure, a History and Physical                            was performed, and patient medications and                            allergies were reviewed. The patient's tolerance of                            previous anesthesia was also reviewed. The risks                            and benefits of the procedure and the sedation                            options and risks were discussed with the patient.                            All questions were answered, and informed consent                            was obtained. Prior Anticoagulants: The patient has                            taken Coumadin (warfarin), last dose was 4 days                            prior to procedure. ASA Grade Assessment: II - A                            patient with mild systemic disease. After reviewing  the risks and benefits, the patient was deemed in                            satisfactory condition to undergo the procedure.                            After obtaining informed consent, the colonoscope                            was passed under direct vision. Throughout the                            procedure, the patient's blood pressure, pulse,  and                            oxygen saturations were monitored continuously. The                            [id number] ([id number]) scope was introduced through                            the anus and advanced to the 7 cm into the ileum.                            The colonoscopy was somewhat difficult due to                            significant looping. Successful completion of the                            procedure was aided by COLOWRAP. The patient                            tolerated the procedure well. The quality of the                            bowel preparation was excellent. The terminal                            ileum, ileocecal valve, appendiceal orifice, and                            rectum were photographed. Scope In: 3:27:14 PM Scope Out: 4:54:09 PM Scope Withdrawal Time: 0 hours 14 minutes 52 seconds  Total Procedure Duration: 0 hours 18 minutes 50 seconds  Findings:      The terminal ileum appeared normal.      Multiple small and large-mouthed diverticula were found in the       recto-sigmoid colon, sigmoid colon, descending colon and ascending colon.      Non-bleeding external hemorrhoids were found during retroflexion and       during perianal exam. The hemorrhoids were large AND ENGORGED.      The recto-sigmoid colon and sigmoid colon were moderately redundant. Impression:               -  The examined portion of the ileum was normal.                           - Diverticulosis in the recto-sigmoid colon, in the                            sigmoid colon, in the descending colon and in the                            ascending colon.                           - RECTAL BLEEDING MOST LIKELY DUE TO INFLAMMED                            external hemorrhoids.                           - Redundant LEFT colon. Moderate Sedation:      Per Anesthesia Care Recommendation:           - Resume previous diet.                           - Continue present medications. ADD ANUSOL  CREAM                            QID. CONSIDER SURGERY IF BLEEDING CONTINUES.                           - Repeat colonoscopy in 10 years for surveillance.                           - Return to GI office in 3 months.                           - Return patient to hospital ward for ongoing care. Procedure Code(s):        --- Professional ---                           [phone], Colonoscopy, flexible; diagnostic, including                            collection of specimen(s) by brushing or washing,                            when performed (separate procedure) Diagnosis Code(s):        --- Professional ---                           K64.4, Residual hemorrhoidal skin tags                           K92.1, Melena (includes Hematochezia)  K57.30, Diverticulosis of large intestine without                            perforation or abscess without bleeding                           Q43.8, Other specified congenital malformations of                            intestine CPT copyright 2016 American Medical Association. All rights reserved. The codes documented in this report are preliminary and upon coder review may  be revised to meet current compliance requirements. Barney Drain, MD Barney Drain MD, MD 02/10/2017 4:03:58 PM This report has been signed electronically. Number of Addenda: 0

## 2017-02-10 NOTE — Anesthesia Procedure Notes (Signed)
Procedure Name: MAC Date/Time: 02/10/2017 3:10 PM Performed by: Andree Elk Amy A, CRNA Pre-anesthesia Checklist: Patient identified, Emergency Drugs available, Suction available, Patient being monitored and Timeout performed Oxygen Delivery Method: Simple face mask

## 2017-02-10 NOTE — Transfer of Care (Signed)
Immediate Anesthesia Transfer of Care Note  Patient: ONYEKACHI GATHRIGHT  Procedure(s) Performed: COLONOSCOPY WITH PROPOFOL (N/A ) ESOPHAGOGASTRODUODENOSCOPY (EGD) WITH PROPOFOL (N/A ) BIOPSY  Patient Location: PACU  Anesthesia Type:MAC  Level of Consciousness: awake, oriented and patient cooperative  Airway & Oxygen Therapy: Patient Spontanous Breathing and Patient connected to nasal cannula oxygen  Post-op Assessment: Report given to RN and Post -op Vital signs reviewed and stable  Post vital signs: Reviewed and stable  Last Vitals:  Vitals:   02/10/17 1455 02/10/17 1500  BP: 113/71   Pulse:    Resp: 12 16  Temp:    SpO2: 99% 94%    Last Pain:  Vitals:   02/10/17 1409  TempSrc: Oral  PainSc:       Patients Stated Pain Goal: 6 (81/85/63 1497)  Complications: No apparent anesthesia complications

## 2017-02-10 NOTE — H&P (Addendum)
Primary Care Physician:  Redmond School, MD Primary Gastroenterologist:  Dr. Oneida Alar  Pre-Procedure History & Physical: HPI:  Larry Mullins is a 66 y.o. male here for DYSPHAGIA/brbpr.  Past Medical History:  Diagnosis Date  . Avascular necrosis of bone of hip, left ([place])   . CAD (coronary artery disease)    a. s/p CABG in 1999.  [name] Dementia   . Diverticulosis   . Hyperlipidemia   . Hypertension   . Superior mesenteric vein thrombosis     Past Surgical History:  Procedure Laterality Date  . 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 no stents per patient    Prior to Admission medications   Medication Sig Start Date End Date Taking? Authorizing Provider  alprazolam Duanne Moron) 2 MG tablet Take 1 tablet by mouth 4 (four) times daily. 08/07/15  Yes [provider]  aspirin EC 81 MG tablet Take 81 mg by mouth daily.   Yes [provider]  atenolol (TENORMIN) 25 MG tablet Take 25 mg by mouth at bedtime.   Yes [provider]  atorvastatin (LIPITOR) 10 MG tablet Take 10 mg by mouth daily.  08/07/15  Yes [provider]  buPROPion (WELLBUTRIN) 100 MG tablet Take 0.5 tablets by mouth 2 (two) times daily.  06/09/15  Yes [provider]  HYDROcodone-acetaminophen (NORCO) 10-325 MG tablet Take 1 tablet by mouth 4 (four) times daily as needed for moderate pain.  08/27/15  Yes [provider]  memantine (NAMENDA) 5 MG tablet Take 1 tablet by mouth 2 (two) times daily. 01/09/17  Yes [provider]  QUEtiapine (SEROQUEL) 100 MG tablet Take 100 mg by mouth at bedtime.   Yes [provider]  traMADol (ULTRAM) 50 MG tablet Take 1 tablet by mouth 4 (four) times daily.  08/07/15  Yes [provider]  warfarin (COUMADIN) 3 MG tablet Take 1 tablet (3 mg total) by mouth daily at 6 PM. 09/10/15  Yes Hosie Poisson, MD    Allergies as of 02/06/2017 - Review Complete 02/06/2017  Allergen Reaction Noted  . Lopressor  [metoprolol tartrate] Hives 09/05/2015    Family History  Problem Relation Age of Onset  . Colon polyps Sister        unsure age of surgery, had to have colon resection   . Colon cancer Neg Hx   . Liver disease Neg Hx     Social History   Socioeconomic History  . Marital status: Married    Spouse name: Not on file  . Number of children: Not on file  . Years of education: Not on file  . Highest education level: Not on file  Social Needs  . Financial resource strain: Not on file  . Food insecurity - worry: Not on file  . Food insecurity - inability: Not on file  . Transportation needs - medical: Not on file  . Transportation needs - non-medical: Not on file  Occupational History  . Not on file  Tobacco Use  . Smoking status: Former Smoker    Types: Cigarettes  . Smokeless tobacco: Former Systems developer    Quit date: 1999  Substance and Sexual Activity  . 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 Sexual activity: Not on file  Other Topics Concern  . Not on file  Social History Narrative  . Not on file    Review of Systems: See HPI, otherwise negative ROS   Physical Exam: BP 132/78   Pulse 84   Temp 98.6 F (37 C) (Oral)   Resp 12   Ht 5\' 8"  (1.727 m)   Wt 173 lb 11.6 oz (78.8 kg)   SpO2 99%   BMI 26.41 kg/m  General:   Alert,  pleasant and cooperative in NAD Head:  Normocephalic and atraumatic. Neck:  Supple; Lungs:  Clear throughout to auscultation.    Heart:  Regular rate and rhythm. Abdomen:  Soft, nontender and nondistended. Normal bowel sounds, without guarding, and without rebound.   Neurologic:  Alert and  oriented x4;  grossly normal neurologically.  Impression/Plan:     DYSPHAGIA/brbpr PLAN:  TCS/EGD/DIL Nilda Riggs hemorrhoid bandingTODAY DISCUSSED PROCEDURE, BENEFITS, & RISKS: < 1% chance of medication  reaction, bleeding, perforation, PELVIC VEIN SEPSIS, or rupture of spleen/liver.

## 2017-02-11 ENCOUNTER — Encounter (HOSPITAL_COMMUNITY)
Admission: EM | Disposition: A | Discharge status: Home / Self Care | Payer: Self-pay | Source: Home / Self Care | Attending: Emergency Medicine

## 2017-02-11 ENCOUNTER — Observation Stay (HOSPITAL_COMMUNITY): Payer: Medicare Other | Admitting: Anesthesiology

## 2017-02-11 ENCOUNTER — Encounter (HOSPITAL_COMMUNITY): Payer: Self-pay | Admitting: Anesthesiology

## 2017-02-11 DIAGNOSIS — K649 Unspecified hemorrhoids: Secondary | ICD-10-CM

## 2017-02-11 DIAGNOSIS — B9681 Helicobacter pylori [H. pylori] as the cause of diseases classified elsewhere: Secondary | ICD-10-CM | POA: Diagnosis not present

## 2017-02-11 DIAGNOSIS — K602 Anal fissure, unspecified: Secondary | ICD-10-CM | POA: Diagnosis not present

## 2017-02-11 DIAGNOSIS — R001 Bradycardia, unspecified: Secondary | ICD-10-CM | POA: Diagnosis not present

## 2017-02-11 DIAGNOSIS — F0390 Unspecified dementia without behavioral disturbance: Secondary | ICD-10-CM | POA: Diagnosis not present

## 2017-02-11 DIAGNOSIS — K222 Esophageal obstruction: Secondary | ICD-10-CM | POA: Diagnosis not present

## 2017-02-11 DIAGNOSIS — K648 Other hemorrhoids: Secondary | ICD-10-CM | POA: Diagnosis not present

## 2017-02-11 DIAGNOSIS — K5730 Diverticulosis of large intestine without perforation or abscess without bleeding: Secondary | ICD-10-CM | POA: Diagnosis not present

## 2017-02-11 DIAGNOSIS — K7460 Unspecified cirrhosis of liver: Secondary | ICD-10-CM | POA: Diagnosis not present

## 2017-02-11 DIAGNOSIS — K644 Residual hemorrhoidal skin tags: Secondary | ICD-10-CM | POA: Diagnosis not present

## 2017-02-11 DIAGNOSIS — K921 Melena: Secondary | ICD-10-CM | POA: Diagnosis not present

## 2017-02-11 DIAGNOSIS — K2950 Unspecified chronic gastritis without bleeding: Secondary | ICD-10-CM | POA: Diagnosis not present

## 2017-02-11 DIAGNOSIS — R1310 Dysphagia, unspecified: Secondary | ICD-10-CM | POA: Diagnosis not present

## 2017-02-11 DIAGNOSIS — I10 Essential (primary) hypertension: Secondary | ICD-10-CM | POA: Diagnosis not present

## 2017-02-11 HISTORY — PX: HEMORRHOID SURGERY: SHX153

## 2017-02-11 LAB — SURGICAL PCR SCREEN
MRSA, PCR: NEGATIVE
STAPHYLOCOCCUS AUREUS: NEGATIVE

## 2017-02-11 LAB — CBC
HEMATOCRIT: 42.1 % (ref 39.0–52.0)
HEMOGLOBIN: 13.8 g/dL (ref 13.0–17.0)
MCH: 31.4 pg (ref 26.0–34.0)
MCHC: 32.8 g/dL (ref 30.0–36.0)
MCV: 95.9 fL (ref 78.0–100.0)
Platelets: 228 10*3/uL (ref 150–400)
RBC: 4.39 MIL/uL (ref 4.22–5.81)
RDW: 13.1 % (ref 11.5–15.5)
WBC: 7.9 10*3/uL (ref 4.0–10.5)

## 2017-02-11 LAB — MRSA PCR SCREENING: MRSA BY PCR: NEGATIVE

## 2017-02-11 SURGERY — HEMORRHOIDECTOMY
Anesthesia: General | Site: Rectum

## 2017-02-11 MED ORDER — ATENOLOL 25 MG PO TABS
25.0000 mg | ORAL_TABLET | Freq: Once | ORAL | Status: AC
  Administered 2017-02-11: 25 mg via ORAL
  Filled 2017-02-11: qty 1

## 2017-02-11 MED ORDER — 0.9 % SODIUM CHLORIDE (POUR BTL) OPTIME
TOPICAL | Status: DC | PRN
  Administered 2017-02-11: 1000 mL

## 2017-02-11 MED ORDER — DEXAMETHASONE SODIUM PHOSPHATE 4 MG/ML IJ SOLN
INTRAMUSCULAR | Status: AC
  Filled 2017-02-11: qty 1

## 2017-02-11 MED ORDER — LORAZEPAM 2 MG/ML IJ SOLN
0.5000 mg | INTRAMUSCULAR | Status: DC | PRN
  Administered 2017-02-12: 0.5 mg via INTRAVENOUS
  Filled 2017-02-11: qty 1

## 2017-02-11 MED ORDER — BUPIVACAINE HCL (PF) 0.5 % IJ SOLN
INTRAMUSCULAR | Status: AC
  Filled 2017-02-11: qty 30

## 2017-02-11 MED ORDER — SODIUM CHLORIDE 0.9 % IJ SOLN
INTRAMUSCULAR | Status: AC
  Filled 2017-02-11: qty 10

## 2017-02-11 MED ORDER — FENTANYL CITRATE (PF) 250 MCG/5ML IJ SOLN
INTRAMUSCULAR | Status: AC
  Filled 2017-02-11: qty 5

## 2017-02-11 MED ORDER — BUPIVACAINE LIPOSOME 1.3 % IJ SUSP
INTRAMUSCULAR | Status: DC | PRN
  Administered 2017-02-11: 20 mL

## 2017-02-11 MED ORDER — MIDAZOLAM HCL 2 MG/2ML IJ SOLN
1.0000 mg | INTRAMUSCULAR | Status: AC
  Administered 2017-02-11: 2 mg via INTRAVENOUS

## 2017-02-11 MED ORDER — SIMETHICONE 80 MG PO CHEW: 40.0000 mg | CHEWABLE_TABLET | Freq: Four times a day (QID) | ORAL | Status: DC | PRN

## 2017-02-11 MED ORDER — METRONIDAZOLE IN NACL 5-0.79 MG/ML-% IV SOLN
INTRAVENOUS | Status: AC
  Filled 2017-02-11: qty 100

## 2017-02-11 MED ORDER — ONDANSETRON 4 MG PO TBDP: 4.0000 mg | ORAL_TABLET | Freq: Four times a day (QID) | ORAL | Status: DC | PRN

## 2017-02-11 MED ORDER — FENTANYL CITRATE (PF) 100 MCG/2ML IJ SOLN
INTRAMUSCULAR | Status: DC | PRN
  Administered 2017-02-11: 25 ug via INTRAVENOUS
  Administered 2017-02-11: 50 ug via INTRAVENOUS

## 2017-02-11 MED ORDER — PROPOFOL 10 MG/ML IV BOLUS
INTRAVENOUS | Status: AC
  Filled 2017-02-11: qty 20

## 2017-02-11 MED ORDER — BUPIVACAINE LIPOSOME 1.3 % IJ SUSP
INTRAMUSCULAR | Status: AC
  Filled 2017-02-11: qty 20

## 2017-02-11 MED ORDER — FENTANYL CITRATE (PF) 100 MCG/2ML IJ SOLN
25.0000 ug | INTRAMUSCULAR | Status: DC | PRN
  Administered 2017-02-11: 50 ug via INTRAVENOUS
  Filled 2017-02-11: qty 2

## 2017-02-11 MED ORDER — ONDANSETRON HCL 4 MG/2ML IJ SOLN: 4.0000 mg | Freq: Four times a day (QID) | INTRAMUSCULAR | Status: DC | PRN

## 2017-02-11 MED ORDER — CHLORHEXIDINE GLUCONATE CLOTH 2 % EX PADS
6.0000 | MEDICATED_PAD | Freq: Once | CUTANEOUS | Status: AC
  Administered 2017-02-11: 6 via TOPICAL

## 2017-02-11 MED ORDER — PROPOFOL 10 MG/ML IV BOLUS
INTRAVENOUS | Status: DC | PRN
  Administered 2017-02-11: 160 mg via INTRAVENOUS

## 2017-02-11 MED ORDER — LIDOCAINE HCL (CARDIAC) 10 MG/ML IV SOLN
INTRAVENOUS | Status: DC | PRN
  Administered 2017-02-11: 40 mg via INTRAVENOUS

## 2017-02-11 MED ORDER — LACTATED RINGERS IV SOLN
INTRAVENOUS | Status: DC
  Administered 2017-02-11: 14:00:00 via INTRAVENOUS

## 2017-02-11 MED ORDER — ONDANSETRON HCL 4 MG/2ML IJ SOLN
4.0000 mg | Freq: Once | INTRAMUSCULAR | Status: AC
  Administered 2017-02-11: 4 mg via INTRAVENOUS

## 2017-02-11 MED ORDER — HYDROCODONE-ACETAMINOPHEN 5-325 MG PO TABS
1.0000 | ORAL_TABLET | ORAL | Status: DC | PRN
  Administered 2017-02-11 – 2017-02-12 (×3): 1 via ORAL
  Filled 2017-02-11 (×3): qty 1

## 2017-02-11 MED ORDER — EPHEDRINE SULFATE 50 MG/ML IJ SOLN
INTRAMUSCULAR | Status: AC
  Filled 2017-02-11: qty 1

## 2017-02-11 MED ORDER — MIDAZOLAM HCL 2 MG/2ML IJ SOLN
INTRAMUSCULAR | Status: AC
  Filled 2017-02-11: qty 2

## 2017-02-11 MED ORDER — LIDOCAINE HCL (PF) 1 % IJ SOLN
INTRAMUSCULAR | Status: AC
Start: 2017-02-11 — End: 2017-02-11
  Filled 2017-02-11: qty 15

## 2017-02-11 MED ORDER — LIDOCAINE VISCOUS 2 % MT SOLN
OROMUCOSAL | Status: AC
  Filled 2017-02-11: qty 15

## 2017-02-11 MED ORDER — LIDOCAINE VISCOUS 2 % MT SOLN
OROMUCOSAL | Status: DC | PRN
  Administered 2017-02-11: 1

## 2017-02-11 MED ORDER — DEXAMETHASONE SODIUM PHOSPHATE 4 MG/ML IJ SOLN
4.0000 mg | Freq: Once | INTRAMUSCULAR | Status: AC
  Administered 2017-02-11: 4 mg via INTRAVENOUS

## 2017-02-11 MED ORDER — ONDANSETRON HCL 4 MG/2ML IJ SOLN
INTRAMUSCULAR | Status: AC
  Filled 2017-02-11: qty 2

## 2017-02-11 MED ORDER — METRONIDAZOLE IN NACL 5-0.79 MG/ML-% IV SOLN
500.0000 mg | INTRAVENOUS | Status: AC
  Administered 2017-02-11: 500 mg via INTRAVENOUS

## 2017-02-11 MED ORDER — ATENOLOL 25 MG PO TABS: 25.0000 mg | ORAL_TABLET | Freq: Every day | ORAL | Status: DC

## 2017-02-11 MED ORDER — MORPHINE SULFATE (PF) 2 MG/ML IV SOLN
2.0000 mg | INTRAVENOUS | Status: DC | PRN
  Administered 2017-02-12: 2 mg via INTRAVENOUS
  Filled 2017-02-11: qty 1

## 2017-02-11 MED ORDER — EPHEDRINE SULFATE 50 MG/ML IJ SOLN
INTRAMUSCULAR | Status: DC | PRN
  Administered 2017-02-11: 10 mg via INTRAVENOUS
  Administered 2017-02-11: 5 mg via INTRAVENOUS
  Administered 2017-02-11: 10 mg via INTRAVENOUS

## 2017-02-11 MED ORDER — LACTATED RINGERS IV SOLN
INTRAVENOUS | Status: DC
  Administered 2017-02-11: 18:00:00 via INTRAVENOUS

## 2017-02-11 SURGICAL SUPPLY — 29 items
BAG HAMPER (MISCELLANEOUS) ×3 IMPLANT
CLOTH BEACON ORANGE TIMEOUT ST (SAFETY) ×3 IMPLANT
COVER LIGHT HANDLE STERIS (MISCELLANEOUS) ×6 IMPLANT
DECANTER SPIKE VIAL GLASS SM (MISCELLANEOUS) ×3 IMPLANT
DRAPE HALF SHEET 40X57 (DRAPES) ×3 IMPLANT
DRAPE PROXIMA HALF (DRAPES) ×3 IMPLANT
ELECT REM PT RETURN 9FT ADLT (ELECTROSURGICAL) ×3
ELECTRODE REM PT RTRN 9FT ADLT (ELECTROSURGICAL) ×1 IMPLANT
FORMALIN 10 PREFIL 120ML (MISCELLANEOUS) ×3 IMPLANT
GAUZE SPONGE 4X4 12PLY STRL (GAUZE/BANDAGES/DRESSINGS) ×3 IMPLANT
GLOVE BIOGEL PI IND STRL 7.0 (GLOVE) ×2 IMPLANT
GLOVE BIOGEL PI INDICATOR 7.0 (GLOVE) ×4
GLOVE SURG SS PI 7.5 STRL IVOR (GLOVE) ×6 IMPLANT
GOWN STRL REUS W/ TWL XL LVL3 (GOWN DISPOSABLE) ×1 IMPLANT
GOWN STRL REUS W/TWL LRG LVL3 (GOWN DISPOSABLE) ×6 IMPLANT
GOWN STRL REUS W/TWL XL LVL3 (GOWN DISPOSABLE) ×2
HEMOSTAT SURGICEL 4X8 (HEMOSTASIS) ×3 IMPLANT
KIT ROOM TURNOVER AP CYSTO (KITS) ×3 IMPLANT
LIGASURE IMPACT 36 18CM CVD LR (INSTRUMENTS) ×3 IMPLANT
MANIFOLD NEPTUNE II (INSTRUMENTS) ×3 IMPLANT
NEEDLE HYPO 22GX1.5 SAFETY (NEEDLE) ×3 IMPLANT
NS IRRIG 1000ML POUR BTL (IV SOLUTION) ×3 IMPLANT
PACK PERI GYN (CUSTOM PROCEDURE TRAY) ×3 IMPLANT
PAD ARMBOARD 7.5X6 YLW CONV (MISCELLANEOUS) ×3 IMPLANT
SET BASIN LINEN APH (SET/KITS/TRAYS/PACK) ×3 IMPLANT
SURGILUBE 3G PEEL PACK STRL (MISCELLANEOUS) ×3 IMPLANT
SUT SILK 0 FSL (SUTURE) ×3 IMPLANT
SUT VIC AB 2-0 CT2 27 (SUTURE) ×6 IMPLANT
SYR 20CC LL (SYRINGE) ×3 IMPLANT

## 2017-02-11 NOTE — Anesthesia Postprocedure Evaluation (Signed)
Anesthesia Post Note  Patient: Larry Mullins  Procedure(s) Performed: HEMORRHOIDECTOMY (N/A Rectum)  Patient location during evaluation: PACU Anesthesia Type: General Level of consciousness: awake and alert and oriented Pain management: pain level controlled Respiratory status: spontaneous breathing Cardiovascular status: blood pressure returned to baseline and stable Postop Assessment: no apparent nausea or vomiting and adequate PO intake Anesthetic complications: no     Last Vitals:  Vitals:   02/11/17 1415 02/11/17 1525  BP: 111/65 105/66  Pulse:  78  Resp: 13 11  Temp:  36.8 C  SpO2: 95% 100%    Last Pain:  Vitals:   02/11/17 1225  TempSrc: Oral  PainSc: 0-No pain                 Arth Nicastro

## 2017-02-11 NOTE — Anesthesia Procedure Notes (Signed)
Procedure Name: LMA Insertion Date/Time: 02/11/2017 2:44 PM Performed by: Ollen Bowl, CRNA Pre-anesthesia Checklist: Patient identified, Patient being monitored, Emergency Drugs available, Timeout performed and Suction available Patient Re-evaluated:Patient Re-evaluated prior to induction Oxygen Delivery Method: Circle System Utilized Preoxygenation: Pre-oxygenation with 100% oxygen Induction Type: IV induction Ventilation: Mask ventilation without difficulty LMA: LMA inserted LMA Size: 4.0 Number of attempts: 1 Placement Confirmation: positive ETCO2 and breath sounds checked- equal and bilateral

## 2017-02-11 NOTE — Consult Note (Signed)
Reason for Consult: Bleeding hemorrhoidal disease Referring Physician: Dr. Lise Auer is an 67 y.o. male.  HPI: Patient is a 67 year old white male with multiple medical problems who presented to the emergency room with gastrointestinal bleeding.  He has been on Coumadin for history of.  Mesenteric vein thrombosis.  He underwent colonoscopy and EGD by Dr. Oneida Alar of GI and was found to have bleeding hemorrhoidal disease.  He was referred to my care for possible hemorrhoidectomy.  Patient states he has been constipated intermittently in the past.  He has had hemorrhoidal issues for many years.  He does have rectal pain with bowel movements.  He currently has no rectal pain.  Past Medical History:  Diagnosis Date  . Avascular necrosis of bone of hip, left ([place])   . CAD (coronary artery disease)    a. s/p CABG in 1999.  [name] Dementia   . Diverticulosis   . Hyperlipidemia   . Hypertension   . Superior mesenteric vein thrombosis     Past Surgical History:  Procedure Laterality Date  . 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 no stents per patient    Family History  Problem Relation Age of Onset  . Colon polyps Sister        unsure age of surgery, had to have colon resection   . Colon cancer Neg Hx   . Liver disease Neg Hx     Social History:  reports that he has quit smoking. His smoking use included cigarettes. He quit smokeless tobacco use about 20 years ago. He reports that he does not drink alcohol or use drugs.  Allergies:  Allergies  Allergen Reactions  . Lopressor [Metoprolol Tartrate] Hives    Medications: I have reviewed the patient's current medications.  Results for orders placed or performed during the hospital encounter of 02/06/17 (from the past 48 hour(s))  CBC     Status: None   Collection Time: 02/11/17  4:58 AM  Result Value Ref Range   WBC 7.9 4.0 - 10.5 K/uL   RBC 4.39 4.22 - 5.81 MIL/uL   Hemoglobin 13.8 13.0 - 17.0 g/dL   HCT 42.1 39.0 - 52.0 %   MCV 95.9 78.0 - 100.0 fL   MCH 31.4 26.0 - 34.0 pg   MCHC 32.8 30.0 - 36.0 g/dL   RDW 13.1 11.5 - 15.5 %   Platelets 228 150 - 400 K/uL    No results found.  ROS:  Pertinent items are noted in HPI.  Blood pressure 115/69, pulse 61, temperature 98 F (36.7 C), temperature source Oral, resp. rate 18, height 5\' 8"  (1.727 m), weight 173 lb 11.6 oz (78.8 kg), SpO2 99 %. Physical Exam: Pleasant well-developed well-nourished white male no acute distress Head is normocephalic, atraumatic Lungs clear to auscultation with equal breath sounds bilaterally Heart examination reveals a regular rate and rhythm without S3, S4, murmurs Rectal examination reveals a posterior anal fissure with no prominent external hemorrhoids present.  I did not do a digital examination.  Colonoscopy report reviewed  Assessment/Plan: Impression: Bleeding hemorrhoidal disease, anal fissure Plan: Patient will be taken to the operating room today for hemorrhoidectomy, possible fissurectomy.  He is currently off Coumadin and his INR is within normal limits.  The risks and benefits of the procedure including bleeding, infection, and recurrence of the hemorrhoidal disease were fully explained to the patient, who gave informed consent.  Aviva Signs 02/11/2017, 8:42 AM

## 2017-02-11 NOTE — Progress Notes (Signed)
Pt's HR sustaining in 130's-150's with ambulation in room/brushing teeth. Dr. Arnoldo Morale and Dr. Jerilee Hoh paged and made aware. New orders received and followed.

## 2017-02-11 NOTE — Progress Notes (Signed)
PROGRESS NOTE    Larry Mullins  BJY:[id number] DOB: [birth year] DOA: 02/06/2017 PCP: Redmond School, MD     Brief Narrative:  67 year old man admitted from home on 1/11 after an episode of bright red blood per rectum.  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  Hemoglobin has remained stable, GI consult was requested and he was admitted to the hospital for further evaluation and management.  Scheduled for EGD/colonoscopy on 1/15.  Found to have on colonoscopy diverticulosis and hemorrhoids which are likely the cause for bleeding.  GI has consulted surgery for inpatient hemorrhoidectomy.  This is planned for 1/16.   Assessment & Plan:   Principal Problem:   GI bleed Active Problems:   Superior mesenteric vein thrombosis   Bradycardia   Melena   Orthostatic hypotension   Dysphagia   Chronic superficial gastritis without bleeding   Bleeding hemorrhoid   Anal fissure   Bright red blood per rectum -Etiology unclear, of note he is on chronic opioids and was constipated and he self disimpacted, probably causing some manual trauma. -Okay to continue Protonix for now, CBC has been stable, no need for blood transfusion. -no further bleeding since admission. -Appreciate GI input and recommendations. -EGD and colonoscopy on 1/15: See note by Dr. Oneida Alar for complete details but in brief patient was noted to have pandiverticulosis as well as hemorrhoids. -Plan for hemorrhoidectomy today by Dr. Arnoldo Morale.  Bradycardia -Was in the 34s on admission increased to 60s.  And now up to 80s-90s. -Had limited episode of HRs in the 130s this am. -Atenolol has been resumed (had been held on admission due to bradycardia).  -HR now back in the 60s.  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 -Anticoagulation remains on hold for now. -Of note his INR was  subtherapeutic on admission. -Need to discuss with GI/Surgery if ok to resume anticoagulation prior to DC.   DVT prophylaxis: SCDs Code Status: Full code Family Communication: Wife at bedside updated on plan of care and questions answered Disposition Plan: If hemorrhoid surgery goes well, can likely discharge home tomorrow.  Consultants:   GI  General surgery  Procedures:   EGD/colonoscopy on 1/15  Hemorrhoidectomy planned for 1/16  Antimicrobials:  Anti-infectives (From admission, onward)   Start     Dose/Rate Route Frequency Ordered Stop   02/12/17 0600  metroNIDAZOLE (FLAGYL) IVPB 500 mg     500 mg 100 mL/hr over 60 Minutes Intravenous On call to O.R. 02/11/17 1228 02/11/17 1447       Subjective: Seen prior to surgery. Doing well, no complaints. No further rectal bleeding.  Objective: Vitals:   02/11/17 1525 02/11/17 1530 02/11/17 1545 02/11/17 1600  BP: 105/66 102/63 106/71 111/69  Pulse: 78 (!) 58 73 68  Resp: 11 10 11 13   Temp: 98.2 F (36.8 C)     TempSrc:      SpO2: 100% 100% 99% 96%  Weight:      Height:        Intake/Output Summary (Last 24 hours) at 02/11/2017 1618 Last data filed at 02/11/2017 1526 Gross per 24 hour  Intake 850 ml  Output 700 ml  Net 150 ml   Filed Weights   02/06/17 2216 02/11/17 1225  Weight: 78.8 kg (173 lb 11.6 oz) 78.5 kg (173 lb)    Examination:   General exam: Alert, awake, oriented x 3 Respiratory system: Clear to  auscultation. Respiratory effort normal. Cardiovascular system:RRR. No murmurs, rubs, gallops. Gastrointestinal system: Abdomen is nondistended, soft and nontender. No organomegaly or masses felt. Normal bowel sounds heard. Central nervous system: Alert and oriented. No focal neurological deficits. Extremities: No C/C/E, +pedal pulses Skin: No rashes, lesions or ulcers Psychiatry: Judgement and insight appear normal. Mood & affect appropriate.       Data Reviewed: I have personally reviewed  following labs and imaging studies  CBC: Recent Labs  Lab 02/06/17 1551 02/06/17 2116 02/07/17 0230 02/08/17 0548 02/11/17 0458  WBC 9.1 7.4 8.0 9.2 7.9  NEUTROABS 6.5  --   --   --   --   HGB 14.8 14.8 14.9 14.8 13.8  HCT 45.4 45.5 45.6 45.4 42.1  MCV 96.0 96.0 95.4 95.8 95.9  PLT 215 215 182 223 814   Basic Metabolic Panel: Recent Labs  Lab 02/06/17 1547 02/06/17 1551 02/08/17 0548  NA  --  136 144  K  --  4.1 3.9  CL  --  104 104  CO2  --  23 29  GLUCOSE  --  162* 101*  BUN  --  15 11  CREATININE  --  0.93 1.07  CALCIUM  --  8.9 9.1  MG 2.0  --   --    GFR: Estimated Creatinine Clearance: 66.6 mL/min (by C-G formula based on SCr of 1.07 mg/dL). Liver Function Tests: Recent Labs  Lab 02/06/17 1551  AST 31  ALT 26  ALKPHOS 79  BILITOT 0.4  PROT 7.0  ALBUMIN 3.8   Recent Labs  Lab 02/06/17 1551  LIPASE 31   No results for input(s): AMMONIA in the last 168 hours. Coagulation Profile: Recent Labs  Lab 02/06/17 1551  INR 1.25   Cardiac Enzymes: Recent Labs  Lab 02/06/17 1551 02/06/17 2116 02/07/17 0230  TROPONINI <0.03 <0.03 <0.03   BNP (last 3 results) No results for input(s): PROBNP in the last 8760 hours. HbA1C: No results for input(s): HGBA1C in the last 72 hours. CBG: No results for input(s): GLUCAP in the last 168 hours. Lipid Profile: No results for input(s): CHOL, HDL, LDLCALC, TRIG, CHOLHDL, LDLDIRECT in the last 72 hours. Thyroid Function Tests: No results for input(s): TSH, T4TOTAL, FREET4, T3FREE, THYROIDAB in the last 72 hours. Anemia Panel: No results for input(s): VITAMINB12, FOLATE, FERRITIN, TIBC, IRON, RETICCTPCT in the last 72 hours. Urine analysis:    Component Value Date/Time   COLORURINE YELLOW 02/06/2017 1730   APPEARANCEUR CLEAR 02/06/2017 1730   LABSPEC 1.009 02/06/2017 1730   PHURINE 7.0 02/06/2017 1730   GLUCOSEU NEGATIVE 02/06/2017 1730   HGBUR NEGATIVE 02/06/2017 1730   BILIRUBINUR NEGATIVE 02/06/2017  1730   KETONESUR NEGATIVE 02/06/2017 1730   PROTEINUR NEGATIVE 02/06/2017 1730   NITRITE NEGATIVE 02/06/2017 1730   LEUKOCYTESUR NEGATIVE 02/06/2017 1730   Sepsis Labs: @LABRCNTIP (procalcitonin:4,lacticidven:4)  ) Recent Results (from the past 240 hour(s))  Urine culture     Status: None   Collection Time: 02/06/17  5:30 PM  Result Value Ref Range Status   Specimen Description URINE, CLEAN CATCH  Final   Special Requests NONE  Final   Culture   Final    NO GROWTH Performed at [facility], [place] [address] [place], [place] [address]    Report Status 02/08/2017 FINAL  Final  MRSA PCR Screening     Status: None   Collection Time: 02/06/17  8:27 PM  Result Value Ref Range Status   MRSA by PCR NEGATIVE NEGATIVE  Final    Comment:        The GeneXpert MRSA Assay (FDA approved for NASAL specimens only), is one component of a comprehensive MRSA colonization surveillance program. It is not intended to diagnose MRSA infection nor to guide or monitor treatment for MRSA infections.   MRSA PCR Screening     Status: None   Collection Time: 02/11/17  5:55 AM  Result Value Ref Range Status   MRSA by PCR NEGATIVE NEGATIVE Final    Comment:        The GeneXpert MRSA Assay (FDA approved for NASAL specimens only), is one component of a comprehensive MRSA colonization surveillance program. It is not intended to diagnose MRSA infection nor to guide or monitor treatment for MRSA infections.   Surgical pcr screen     Status: None   Collection Time: 02/11/17  8:46 AM  Result Value Ref Range Status   MRSA, PCR NEGATIVE NEGATIVE Final   Staphylococcus aureus NEGATIVE NEGATIVE Final    Comment: (NOTE) The Xpert SA Assay (FDA approved for NASAL specimens in patients 66 years of age and older), is one component of a comprehensive surveillance program. It is not intended to diagnose infection nor to guide or monitor treatment.          Radiology Studies: No results  found.      Scheduled Meds: . [MAR Hold] ALPRAZolam  1 mg Oral BID  . [MAR Hold] atenolol  25 mg Oral QHS  . [MAR Hold] atorvastatin  20 mg Oral q1800  . [MAR Hold] buPROPion  50 mg Oral BID  . [MAR Hold] docusate sodium  100 mg Oral BID  . [MAR Hold] hydrocortisone   Rectal QID  . [MAR Hold] memantine  5 mg Oral BID  . [MAR Hold] pantoprazole  40 mg Oral BID AC  . [MAR Hold] polyethylene glycol  17 g Oral Daily  . [MAR Hold] QUEtiapine  100 mg Oral QHS   Continuous Infusions: . lactated ringers       LOS: 0 days    Time spent: 25 minutes. Greater than 50% of this time was spent in direct contact with the patient coordinating care.     Lelon Frohlich, MD [facility] Pager [phone]  If 7PM-7AM, please contact night-coverage [email or web address] Password [email or web address] 02/11/2017, 4:18 PM

## 2017-02-11 NOTE — Op Note (Signed)
Patient:  Larry Mullins  DOB:  04/07/1950  MRN:  [id number]   Preop Diagnosis: Bleeding hemorrhoidal disease, posterior anal fissure  Postop Diagnosis: Same  Procedure: Extensive hemorrhoidectomy with fissurectomy  Surgeon: Aviva Signs, MD  Anes: General  Indications: Patient is a 67 year old white male who was found on colonoscopy for GI bleeding due to anticoagulation to have hemorrhoidal disease.  He also on examination has a posterior anal fissure.  The risks and benefits of the procedures including bleeding, infection, incontinence, and the possibility of recurrence of the hemorrhoidal disease were fully explained to the patient, who gave informed consent.  Procedure note: The patient was placed in the lithotomy position after general anesthesia was administered.  The perineum was prepped and draped using the usual sterile technique with Betadine.  Surgical site confirmation was performed.  On rectal examination, the patient had a posterior anal fissure.  He also had 2 internal hemorrhoids with minimal external hemorrhoidal tissue present.  These columns were at the 4:00 and 7 o'clock position.  Both were excised without difficulty.  The fissure was also excised and the surrounding mucosa undermined in order to perform a flap closure of the anal fissure.  This was done using 2-0 Vicryl interrupted sutures.  Exparel was instilled into the surrounding wound.  Care was taken to avoid the external sphincter mechanism.  Surgicel and Viscous Xylocaine rectal packing was then placed.  All tape needle counts were correct at the end of the procedure.  The patient was awakened and transferred to PACU in stable condition.  Complications: None  EBL: Minimal  Specimen: Hemorrhoids

## 2017-02-11 NOTE — Addendum Note (Signed)
Addendum  created 02/11/17 1241 by Vista Deck, CRNA   Charge Capture section accepted, Visit diagnoses modified

## 2017-02-11 NOTE — Transfer of Care (Signed)
Immediate Anesthesia Transfer of Care Note  Patient: Larry Mullins  Procedure(s) Performed: HEMORRHOIDECTOMY (N/A Rectum)  Patient Location: PACU  Anesthesia Type:General  Level of Consciousness: awake and alert   Airway & Oxygen Therapy: Patient Spontanous Breathing and Patient connected to face mask oxygen  Post-op Assessment: Report given to RN  Post vital signs: Reviewed and stable  Last Vitals:  Vitals:   02/11/17 1410 02/11/17 1415  BP: 108/67 111/65  Pulse:    Resp: 15 13  Temp:    SpO2: 95% 95%    Last Pain:  Vitals:   02/11/17 1225  TempSrc: Oral  PainSc: 0-No pain      Patients Stated Pain Goal: 5 (48/54/62 7035)  Complications: No apparent anesthesia complications

## 2017-02-11 NOTE — Anesthesia Preprocedure Evaluation (Addendum)
Anesthesia Evaluation  Patient identified by MRN, date of birth, ID band Patient awake    Reviewed: Allergy & Precautions, NPO status , Patient's Chart, lab work & pertinent test results  Airway Mallampati: I  TM Distance: >3 FB Neck ROM: Full    Dental  (+) Teeth Intact   Pulmonary former smoker,    breath sounds clear to auscultation       Cardiovascular hypertension, (-) angina+ CAD and + CABG   Rhythm:Regular Rate:Normal     Neuro/Psych PSYCHIATRIC DISORDERS    GI/Hepatic (+) Cirrhosis       , Hx SMA thrombosis   Endo/Other    Renal/GU      Musculoskeletal AVN L Hip    Abdominal   Peds  Hematology   Anesthesia Other Findings   Reproductive/Obstetrics                             Anesthesia Physical Anesthesia Plan  ASA: III  Anesthesia Plan: General   Post-op Pain Management:    Induction: Intravenous  PONV Risk Score and Plan:   Airway Management Planned: LMA  Additional Equipment:   Intra-op Plan:   Post-operative Plan: Extubation in OR  Informed Consent: I have reviewed the patients History and Physical, chart, labs and discussed the procedure including the risks, benefits and alternatives for the proposed anesthesia with the patient or authorized representative who has indicated his/her understanding and acceptance.     Plan Discussed with:   Anesthesia Plan Comments:        Anesthesia Quick Evaluation

## 2017-02-12 ENCOUNTER — Telehealth: Payer: Self-pay | Admitting: Gastroenterology

## 2017-02-12 ENCOUNTER — Encounter (HOSPITAL_COMMUNITY): Payer: Self-pay | Admitting: General Surgery

## 2017-02-12 DIAGNOSIS — K602 Anal fissure, unspecified: Secondary | ICD-10-CM | POA: Diagnosis not present

## 2017-02-12 DIAGNOSIS — I81 Portal vein thrombosis: Secondary | ICD-10-CM | POA: Diagnosis not present

## 2017-02-12 DIAGNOSIS — R531 Weakness: Secondary | ICD-10-CM | POA: Diagnosis not present

## 2017-02-12 DIAGNOSIS — K649 Unspecified hemorrhoids: Secondary | ICD-10-CM | POA: Diagnosis not present

## 2017-02-12 DIAGNOSIS — R001 Bradycardia, unspecified: Secondary | ICD-10-CM | POA: Diagnosis not present

## 2017-02-12 DIAGNOSIS — K2950 Unspecified chronic gastritis without bleeding: Secondary | ICD-10-CM | POA: Diagnosis not present

## 2017-02-12 LAB — CBC
HEMATOCRIT: 41.9 % (ref 39.0–52.0)
HEMOGLOBIN: 13.7 g/dL (ref 13.0–17.0)
MCH: 31.4 pg (ref 26.0–34.0)
MCHC: 32.7 g/dL (ref 30.0–36.0)
MCV: 95.9 fL (ref 78.0–100.0)
Platelets: 233 10*3/uL (ref 150–400)
RBC: 4.37 MIL/uL (ref 4.22–5.81)
RDW: 13 % (ref 11.5–15.5)
WBC: 12.7 10*3/uL — AB (ref 4.0–10.5)

## 2017-02-12 MED ORDER — ATENOLOL 25 MG PO TABS: 12.5000 mg | ORAL_TABLET | Freq: Every day | ORAL | 1 refills | Status: DC

## 2017-02-12 MED ORDER — PANTOPRAZOLE SODIUM 40 MG PO TBEC: 40.0000 mg | DELAYED_RELEASE_TABLET | Freq: Two times a day (BID) | ORAL | 1 refills | Status: DC

## 2017-02-12 MED ORDER — POLYETHYLENE GLYCOL 3350 17 G PO PACK: 17.0000 g | PACK | Freq: Every day | ORAL | 0 refills | Status: AC

## 2017-02-12 NOTE — Progress Notes (Signed)
Pt discharged home today per Dr. Tat. Pt's IV site D/C'd and WDL. Pt's VSS. Pt provided with home medication list, discharge instructions and prescriptions. Verbalized understanding. Pt left floor via WC in stable condition accompanied by NT. 

## 2017-02-12 NOTE — Telephone Encounter (Signed)
Needs 3 month f/u with Larry Mullins or RMR for dysphagia, ?liver disease

## 2017-02-12 NOTE — Anesthesia Postprocedure Evaluation (Signed)
Anesthesia Post Note  Patient: Larry Mullins  Procedure(s) Performed: HEMORRHOIDECTOMY (N/A Rectum)  Patient location during evaluation: Nursing Unit Anesthesia Type: General Level of consciousness: awake and alert and patient cooperative Pain management: satisfactory to patient Vital Signs Assessment: post-procedure vital signs reviewed and stable Respiratory status: spontaneous breathing Cardiovascular status: stable Postop Assessment: no apparent nausea or vomiting Anesthetic complications: no     Last Vitals:  Vitals:   02/11/17 2125 02/12/17 0600  BP:  119/63  Pulse:  60  Resp:  18  Temp:  36.5 C  SpO2: 96% 100%    Last Pain:  Vitals:   02/12/17 0910  TempSrc:   PainSc: Asleep                 Shayon Trompeter

## 2017-02-12 NOTE — Progress Notes (Signed)
1 Day Post-Op  Subjective: Patient feels well.  States he has had small bowel movement yesterday evening.  Not having significant blood per rectum.  Objective: Vital signs in last 24 hours: Temp:  [97.6 F (36.4 C)-98.2 F (36.8 C)] 97.7 F (36.5 C) (01/17 0600) Pulse Rate:  [58-136] 60 (01/17 0600) Resp:  [10-35] 18 (01/17 0600) BP: (90-121)/(53-78) 119/63 (01/17 0600) SpO2:  [81 %-100 %] 100 % (01/17 0600) Weight:  [173 lb (78.5 kg)] 173 lb (78.5 kg) (01/16 1225) Last BM Date: 02/11/17  Intake/Output from previous day: 01/16 0701 - 01/17 0700 In: 1905 [P.O.:240; I.V.:1665] Out: 675 [Urine:675] Intake/Output this shift: Total I/O In: 312.5 [I.V.:312.5] Out: -   General appearance: alert, cooperative and no distress  Lab Results:  Recent Labs    02/11/17 0458  WBC 7.9  HGB 13.8  HCT 42.1  PLT 228   BMET No results for input(s): NA, K, CL, CO2, GLUCOSE, BUN, CREATININE, CALCIUM in the last 72 hours. PT/INR No results for input(s): LABPROT, INR in the last 72 hours.  Studies/Results: No results found.  Anti-infectives: Anti-infectives (From admission, onward)   Start     Dose/Rate Route Frequency Ordered Stop   02/12/17 0600  metroNIDAZOLE (FLAGYL) IVPB 500 mg     500 mg 100 mL/hr over 60 Minutes Intravenous On call to O.R. 02/11/17 1228 02/11/17 1447      Assessment/Plan: s/p Procedure(s): HEMORRHOIDECTOMY Impression: Stable status post hemorrhoidectomy with fissurectomy.  Bowel function returning.  Tolerating regular diet well.  Plan: Okay for discharge from surgery standpoint.  Instructions given to patient about not taking a bath.  Should use medicated wipes.  Avoid constipation.  Follow-up in my office in 1 week.  LOS: 0 days    Aviva Signs 02/12/2017

## 2017-02-12 NOTE — Discharge Instructions (Signed)
Surgical Procedures for Hemorrhoids, Care After °Refer to this sheet in the next few weeks. These instructions provide you with information about caring for yourself after your procedure. Your health care provider may also give you more specific instructions. Your treatment has been planned according to current medical practices, but problems sometimes occur. Call your health care provider if you have any problems or questions after your procedure. °What can I expect after the procedure? °After the procedure, it is common to have: °· Rectal pain. °· Pain when you are having a bowel movement. °· Slight rectal bleeding. ° °Follow these instructions at home: °Medicines °· Take over-the-counter and prescription medicines only as told by your health care provider. °· Do not drive or operate heavy machinery while taking prescription pain medicine. °· Use a stool softener or a bulk laxative as told by your health care provider. °Activity °· Rest at home. Return to your normal activities as told by your health care provider. °· Do not lift anything that is heavier than 10 lb (4.5 kg). °· Do not sit for long periods of time. Take a walk every day or as told by your health care provider. °· Do not strain to have a bowel movement. Do not spend a long time sitting on the toilet. °Eating and drinking °· Eat foods that contain fiber, such as whole grains, beans, nuts, fruits, and vegetables. °· Drink enough fluid to keep your urine clear or pale yellow. °General instructions °· Sit in a warm bath 2-3 times per day to relieve soreness or itching. °· Keep all follow-up visits as told by your health care provider. This is important. °Contact a health care provider if: °· Your pain medicine is not helping. °· You have a fever or chills. °· You become constipated. °· You have trouble passing urine. °Get help right away if: °· You have very bad rectal pain. °· You have heavy bleeding from your rectum. °This information is not intended  to replace advice given to you by your health care provider. Make sure you discuss any questions you have with your health care provider. °Document Released: 04/05/2003 Document Revised: 06/21/2015 Document Reviewed: 04/10/2014 °Elsevier Interactive Patient Education © 2018 Elsevier Inc. ° °

## 2017-02-12 NOTE — Addendum Note (Signed)
Addendum  created 02/12/17 1117 by Vista Deck, CRNA   Sign clinical note

## 2017-02-12 NOTE — Discharge Summary (Signed)
Physician Discharge Summary  DAMARRI RAMPY IDP:[id number] DOB: February 07, 1950 DOA: 02/06/2017  PCP: Redmond School, MD  Admit date: 02/06/2017 Discharge date: 02/12/2017  Admitted From: Home Disposition:  Home   Recommendations for Outpatient Follow-up:  1. Please obtain INR one week and adjust coumadin for INR 2-3 2. Follow up Dr. Arnoldo Morale in 1 week     Discharge Condition: Stable CODE STATUS: FULL Diet recommendation: Heart Healthy   Brief/Interim Summary: 67 year old man admitted from home on 1/11 after an episode of bright red blood per rectum.  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  Hemoglobin has remained stable, GI consult was requested and he was admitted to the hospital for further evaluation and management.  Scheduled for EGD/colonoscopy on 1/15.  Found to have on colonoscopy diverticulosis and hemorrhoids which are likely the cause for bleeding.  GI has consulted surgery for inpatient hemorrhoidectomy which was performed on 02/11/17    Discharge Diagnoses:  Hematochezia -Etiology unclear, of note he is on chronic opioids and was constipated and he self disimpacted, probably causing some manual trauma. -Okay to continue Protonix for now, CBC has been stable, no need for blood transfusion. -no further bleeding since admission. -Appreciate GI input and recommendations. -EGD and colonoscopy on 1/15: See note by Dr. Oneida Alar for complete details but in brief patient was noted to have pandiverticulosis as well as hemorrhoids.  EGD showed widely patent schatzki's ring and moderate gastritis-->continue protonix -hemorrhoidectomy 02/11/17 by Dr. Arnoldo Morale. -Hgb has remained stable throughout hospitalization  Bradycardia -Was in the 16s on admission increased to 60s.  And now up to 60s at rest. -Had limited episode of HRs in the 130s this am. -Atenolol has been resumed (had been held on admission due to  bradycardia).  -HR now back in the 60s. 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 -Anticoagulation remains on hold for surgery -per GI, clinical significance uncertain. -Of note his INR was subtherapeutic on admission. -discussed with general surgery--okay to resume coumadin today -follow up with PCP for INR check in one week     Discharge Instructions  Discharge Instructions    Diet - low sodium heart healthy   Complete by:  As directed    Increase activity slowly   Complete by:  As directed      Allergies as of 02/12/2017      Reactions   Lopressor [metoprolol Tartrate] Hives      Medication List    TAKE these medications   alprazolam 2 MG tablet Commonly known as:  XANAX Take 1 tablet by mouth 4 (four) times daily.   aspirin EC 81 MG tablet Take 81 mg by mouth daily.   atenolol 25 MG tablet Commonly known as:  TENORMIN Take 0.5 tablets (12.5 mg total) by mouth at bedtime. What changed:  how much to take   atorvastatin 10 MG tablet Commonly known as:  LIPITOR Take 10 mg by mouth daily.   buPROPion 100 MG tablet Commonly known as:  WELLBUTRIN Take 0.5 tablets by mouth 2 (two) times daily.   HYDROcodone-acetaminophen 10-325 MG tablet Commonly known as:  NORCO Take 1 tablet by mouth 4 (four) times daily as needed for moderate pain.   memantine 5 MG tablet Commonly known as:  NAMENDA Take 1 tablet by mouth 2 (two) times daily.   pantoprazole 40 MG tablet Commonly  known as:  PROTONIX Take 1 tablet (40 mg total) by mouth 2 (two) times daily before a meal.   polyethylene glycol packet Commonly known as:  MIRALAX / GLYCOLAX Take 17 g by mouth daily. Start taking on:  02/13/2017   QUEtiapine 100 MG tablet Commonly known as:  SEROQUEL Take 100 mg by mouth at bedtime.   traMADol 50 MG tablet Commonly known as:   ULTRAM Take 1 tablet by mouth 4 (four) times daily.   warfarin 3 MG tablet Commonly known as:  COUMADIN Take 1 tablet (3 mg total) by mouth daily at 6 PM.      Follow-up Information    Aviva Signs, MD On 02/19/2017.   Specialty:  General Surgery Why:  at 9:45 am Contact information: [id number] [facility] [id number] [phone]          Allergies  Allergen Reactions  . Lopressor [Metoprolol Tartrate] Hives    Consultations:  GI, general surgery   Procedures/Studies: Ct Abdomen Pelvis Wo Contrast  Result Date: 02/06/2017 CLINICAL DATA:  67 year old with generalized weakness. Blood in stool. EXAM: CT ABDOMEN AND PELVIS WITHOUT CONTRAST TECHNIQUE: Multidetector CT imaging of the abdomen and pelvis was performed following the standard protocol without IV contrast. COMPARISON:  Abdominal CT 09/05/2015 and abdominal ultrasound 01/30/2017. FINDINGS: Lower chest: Median sternotomy wires are present. Lung bases are clear without pleural effusions. Coronary artery calcifications. Hepatobiliary: Liver is mildly heterogeneous but poorly characterized on this noncontrast examination. Gallbladder is decompressed and difficult to exclude any gallbladder pathology on this examination. Pancreas: Normal appearance of the pancreas without inflammation or duct dilatation. Spleen: Normal appearance of spleen without enlargement. Adrenals/Urinary Tract: Normal adrenal glands. 2 mm stone in right kidney mid/lower pole region without hydronephrosis. Mild distention of the urinary bladder. Normal appearance of the left kidney without hydronephrosis or stones. Stomach/Bowel: Diverticulum in the distal duodenum. Colonic diverticulosis. Moderate amount of colonic stool. No focal bowel inflammation. Normal appendix. Previously, there was stranding in the central mesentery related to SMV thrombus. This central mesenteric edema has resolved. Cannot evaluate for portal or SMV thrombosis on this  noncontrast examination. Vascular/Lymphatic: Atherosclerotic calcifications in the abdominal aorta without aneurysm. Small lymph nodes in the periaortic region but no significant change. No significant lymph node enlargement in the abdomen or pelvis. Reproductive: Prostate is prominent for size measuring 4.9 cm in transverse dimension. Other: No ascites. Negative for free air. Small umbilical hernia containing fat. Surgical clips in the lower right inguinal canal near the scrotum. Musculoskeletal: Again noted is sclerosis with some collapse of the left femoral head. Prominent subchondral cystic formations in left femoral head as well. Findings compatible with avascular necrosis. IMPRESSION: Mild distention of the urinary bladder without significant hydronephrosis. Nonobstructive right kidney stone. Colonic diverticulosis without focal bowel inflammation. Chronic left femoral head avascular necrosis. Electronically Signed   By: Markus Daft M.D.   On: 02/06/2017 17:20   Dg Chest 2 View  Result Date: 02/06/2017 CLINICAL DATA:  Weakness.  Hypertension.  Dementia. EXAM: CHEST  2 VIEW COMPARISON:  09/06/2015 FINDINGS: Midline trachea. Normal heart size. Prior median sternotomy. No pleural effusion or pneumothorax. Clear lungs. IMPRESSION: No acute cardiopulmonary disease. Electronically Signed   By: Abigail Miyamoto M.D.   On: 02/06/2017 17:17   US Abdomen Complete  Result Date: 01/30/2017 CLINICAL DATA:  Cirrhosis, hepatocellular carcinoma screening EXAM: ABDOMEN ULTRASOUND COMPLETE COMPARISON:  CT abdomen and pelvis 09/05/2015 FINDINGS: Gallbladder: Normally distended without stones or pericholecystic fluid. Upper normal wall thickness. No sonographic  Murphy sign. Common bile duct: Diameter: 6 mm diameter, normal for age Liver: Mildly increased hepatic echogenicity. No discrete hepatic mass or nodularity identified. Portal vein is patent on color Doppler imaging with normal direction of blood flow towards the liver.  IVC: Normal appearance Pancreas: Obscured by bowel gas Spleen: Normal appearance, 7.9 cm length Right Kidney: Length: 11.0 cm. Normal morphology without mass or hydronephrosis. Left Kidney: Length: 12.9 cm. Normal morphology without mass or hydronephrosis. Abdominal aorta: Normal caliber with atherosclerotic changes noted. Other findings: No free fluid IMPRESSION: Increased hepatic echogenicity consistent with patient's history of cirrhosis. Nonvisualization of pancreas. Otherwise negative exam. Electronically Signed   By: Lavonia Dana M.D.   On: 01/30/2017 14:38        Discharge Exam: Vitals:   02/11/17 2125 02/12/17 0600  BP:  119/63  Pulse:  60  Resp:  18  Temp:  97.7 F (36.5 C)  SpO2: 96% 100%   Vitals:   02/11/17 1730 02/11/17 1830 02/11/17 2125 02/12/17 0600  BP: 114/62 (!) 111/53  119/63  Pulse: 62 66  60  Resp: 20 20  18   Temp: 97.6 F (36.4 C) 97.7 F (36.5 C)  97.7 F (36.5 C)  TempSrc: Oral Oral  Oral  SpO2: 99%  96% 100%  Weight:      Height:        General: Pt is alert, awake, not in acute distress Cardiovascular: RRR, S1/S2 +, no rubs, no gallops Respiratory: CTA bilaterally, no wheezing, no rhonchi Abdominal: Soft, NT, ND, bowel sounds + Extremities: no edema, no cyanosis   The results of significant diagnostics from this hospitalization (including imaging, microbiology, ancillary and laboratory) are listed below for reference.    Significant Diagnostic Studies: Ct Abdomen Pelvis Wo Contrast  Result Date: 02/06/2017 CLINICAL DATA:  67 year old with generalized weakness. Blood in stool. EXAM: CT ABDOMEN AND PELVIS WITHOUT CONTRAST TECHNIQUE: Multidetector CT imaging of the abdomen and pelvis was performed following the standard protocol without IV contrast. COMPARISON:  Abdominal CT 09/05/2015 and abdominal ultrasound 01/30/2017. FINDINGS: Lower chest: Median sternotomy wires are present. Lung bases are clear without pleural effusions. Coronary artery  calcifications. Hepatobiliary: Liver is mildly heterogeneous but poorly characterized on this noncontrast examination. Gallbladder is decompressed and difficult to exclude any gallbladder pathology on this examination. Pancreas: Normal appearance of the pancreas without inflammation or duct dilatation. Spleen: Normal appearance of spleen without enlargement. Adrenals/Urinary Tract: Normal adrenal glands. 2 mm stone in right kidney mid/lower pole region without hydronephrosis. Mild distention of the urinary bladder. Normal appearance of the left kidney without hydronephrosis or stones. Stomach/Bowel: Diverticulum in the distal duodenum. Colonic diverticulosis. Moderate amount of colonic stool. No focal bowel inflammation. Normal appendix. Previously, there was stranding in the central mesentery related to SMV thrombus. This central mesenteric edema has resolved. Cannot evaluate for portal or SMV thrombosis on this noncontrast examination. Vascular/Lymphatic: Atherosclerotic calcifications in the abdominal aorta without aneurysm. Small lymph nodes in the periaortic region but no significant change. No significant lymph node enlargement in the abdomen or pelvis. Reproductive: Prostate is prominent for size measuring 4.9 cm in transverse dimension. Other: No ascites. Negative for free air. Small umbilical hernia containing fat. Surgical clips in the lower right inguinal canal near the scrotum. Musculoskeletal: Again noted is sclerosis with some collapse of the left femoral head. Prominent subchondral cystic formations in left femoral head as well. Findings compatible with avascular necrosis. IMPRESSION: Mild distention of the urinary bladder without significant hydronephrosis. Nonobstructive right kidney stone. Colonic  diverticulosis without focal bowel inflammation. Chronic left femoral head avascular necrosis. Electronically Signed   By: Markus Daft M.D.   On: 02/06/2017 17:20   Dg Chest 2 View  Result Date:  02/06/2017 CLINICAL DATA:  Weakness.  Hypertension.  Dementia. EXAM: CHEST  2 VIEW COMPARISON:  09/06/2015 FINDINGS: Midline trachea. Normal heart size. Prior median sternotomy. No pleural effusion or pneumothorax. Clear lungs. IMPRESSION: No acute cardiopulmonary disease. Electronically Signed   By: Abigail Miyamoto M.D.   On: 02/06/2017 17:17   US Abdomen Complete  Result Date: 01/30/2017 CLINICAL DATA:  Cirrhosis, hepatocellular carcinoma screening EXAM: ABDOMEN ULTRASOUND COMPLETE COMPARISON:  CT abdomen and pelvis 09/05/2015 FINDINGS: Gallbladder: Normally distended without stones or pericholecystic fluid. Upper normal wall thickness. No sonographic Murphy sign. Common bile duct: Diameter: 6 mm diameter, normal for age Liver: Mildly increased hepatic echogenicity. No discrete hepatic mass or nodularity identified. Portal vein is patent on color Doppler imaging with normal direction of blood flow towards the liver. IVC: Normal appearance Pancreas: Obscured by bowel gas Spleen: Normal appearance, 7.9 cm length Right Kidney: Length: 11.0 cm. Normal morphology without mass or hydronephrosis. Left Kidney: Length: 12.9 cm. Normal morphology without mass or hydronephrosis. Abdominal aorta: Normal caliber with atherosclerotic changes noted. Other findings: No free fluid IMPRESSION: Increased hepatic echogenicity consistent with patient's history of cirrhosis. Nonvisualization of pancreas. Otherwise negative exam. Electronically Signed   By: Lavonia Dana M.D.   On: 01/30/2017 14:38     Microbiology: Recent Results (from the past 240 hour(s))  Urine culture     Status: None   Collection Time: 02/06/17  5:30 PM  Result Value Ref Range Status   Specimen Description URINE, CLEAN CATCH  Final   Special Requests NONE  Final   Culture   Final    NO GROWTH Performed at [facility], [address] [place], [place] [address]    Report Status 02/08/2017 FINAL  Final  MRSA PCR Screening     Status: None    Collection Time: 02/06/17  8:27 PM  Result Value Ref Range Status   MRSA by PCR NEGATIVE NEGATIVE Final    Comment:        The GeneXpert MRSA Assay (FDA approved for NASAL specimens only), is one component of a comprehensive MRSA colonization surveillance program. It is not intended to diagnose MRSA infection nor to guide or monitor treatment for MRSA infections.   MRSA PCR Screening     Status: None   Collection Time: 02/11/17  5:55 AM  Result Value Ref Range Status   MRSA by PCR NEGATIVE NEGATIVE Final    Comment:        The GeneXpert MRSA Assay (FDA approved for NASAL specimens only), is one component of a comprehensive MRSA colonization surveillance program. It is not intended to diagnose MRSA infection nor to guide or monitor treatment for MRSA infections.   Surgical pcr screen     Status: None   Collection Time: 02/11/17  8:46 AM  Result Value Ref Range Status   MRSA, PCR NEGATIVE NEGATIVE Final   Staphylococcus aureus NEGATIVE NEGATIVE Final    Comment: (NOTE) The Xpert SA Assay (FDA approved for NASAL specimens in patients 66 years of age and older), is one component of a comprehensive surveillance program. It is not intended to diagnose infection nor to guide or monitor treatment.      Labs: Basic Metabolic Panel: Recent Labs  Lab 02/06/17 1547 02/06/17 1551 02/08/17 0548  NA  --  136 144  K  --  4.1 3.9  CL  --  104 104  CO2  --  23 29  GLUCOSE  --  162* 101*  BUN  --  15 11  CREATININE  --  0.93 1.07  CALCIUM  --  8.9 9.1  MG 2.0  --   --    Liver Function Tests: Recent Labs  Lab 02/06/17 1551  AST 31  ALT 26  ALKPHOS 79  BILITOT 0.4  PROT 7.0  ALBUMIN 3.8   Recent Labs  Lab 02/06/17 1551  LIPASE 31   No results for input(s): AMMONIA in the last 168 hours. CBC: Recent Labs  Lab 02/06/17 1551 02/06/17 2116 02/07/17 0230 02/08/17 0548 02/11/17 0458 02/12/17 0741  WBC 9.1 7.4 8.0 9.2 7.9 12.7*  NEUTROABS 6.5  --   --   --    --   --   HGB 14.8 14.8 14.9 14.8 13.8 13.7  HCT 45.4 45.5 45.6 45.4 42.1 41.9  MCV 96.0 96.0 95.4 95.8 95.9 95.9  PLT 215 215 182 223 228 233   Cardiac Enzymes: Recent Labs  Lab 02/06/17 1551 02/06/17 2116 02/07/17 0230  TROPONINI <0.03 <0.03 <0.03   BNP: Invalid input(s): POCBNP CBG: No results for input(s): GLUCAP in the last 168 hours.  Time coordinating discharge:  Greater than 30 minutes  Signed:  Orson Eva, DO [facility] Pager: [phone] 02/12/2017, 12:00 PM

## 2017-02-16 ENCOUNTER — Telehealth: Payer: Self-pay | Admitting: Gastroenterology

## 2017-02-16 ENCOUNTER — Other Ambulatory Visit: Payer: Self-pay

## 2017-02-16 ENCOUNTER — Telehealth: Payer: Self-pay | Admitting: Internal Medicine

## 2017-02-16 ENCOUNTER — Telehealth: Payer: Self-pay

## 2017-02-16 DIAGNOSIS — K55069 Acute infarction of intestine, part and extent unspecified: Secondary | ICD-10-CM

## 2017-02-16 DIAGNOSIS — K551 Chronic vascular disorders of intestine: Principal | ICD-10-CM

## 2017-02-16 DIAGNOSIS — Q400 Congenital hypertrophic pyloric stenosis: Secondary | ICD-10-CM

## 2017-02-16 MED ORDER — BIS SUBCIT-METRONID-TETRACYC 140-125-125 MG PO CAPS: ORAL_CAPSULE | ORAL | 0 refills | Status: DC

## 2017-02-16 NOTE — Telephone Encounter (Signed)
Dr. Oneida Alar, pt has an appointment with AB on 03/02/17. Does he need to keep this appointment or push it out to the 3 months?

## 2017-02-16 NOTE — Telephone Encounter (Signed)
OPV IN 3 MOS W/ RMR OR AS.

## 2017-02-16 NOTE — Telephone Encounter (Signed)
PLEASE CALL PT. HIS STOMACH BIOPSIES SHOW H. Pylori gastritis. HE TAKES SEROQUEL SO HE NEEDS PYLERA 3 PILLS QID FOR 10 DAYS. He should take PROTONIX BID. The meds can cause nausea, vomiting, abd cramps, loose stools, black colored stools, and metallic taste in her mouth.  HE SHOULD TAKE 1/2 COUMADIN TABLET WHILE TAKING THE ANTIBIOTICS. HAVE A PT/INR CHECKED 4 DAYS AFTER STARTING THE ABX.  OUTPATIENT VISIT IN 3 MOS W/ DR. Gala Romney.

## 2017-02-16 NOTE — Progress Notes (Signed)
rx has already been sent by [facility]. Closing note out.

## 2017-02-16 NOTE — Telephone Encounter (Signed)
Lab order mailed to pt, see note from [facility]. Pt should have lab work done 4 days after starting antibiotic.

## 2017-02-16 NOTE — Telephone Encounter (Signed)
Noted. Orders done.

## 2017-02-16 NOTE — Telephone Encounter (Signed)
Patient wife called and would like his lab order sent to to the [facility] on [address] beside [address]

## 2017-02-17 ENCOUNTER — Encounter (HOSPITAL_COMMUNITY): Payer: Self-pay | Admitting: Gastroenterology

## 2017-02-18 ENCOUNTER — Telehealth: Payer: Self-pay | Admitting: Internal Medicine

## 2017-02-18 ENCOUNTER — Encounter: Payer: Self-pay | Admitting: Internal Medicine

## 2017-02-18 ENCOUNTER — Other Ambulatory Visit: Payer: Self-pay

## 2017-02-18 NOTE — Telephone Encounter (Signed)
[phone] please call patient, the prescription called in for him was 1000$ and she can not afford that, needs an alternative

## 2017-02-18 NOTE — Telephone Encounter (Signed)
Spoke with [employer]. They didn't receive the mediation sent electronically. Gave a verbal over the phone. If Rx is covered, pharmacy will notify us.   Pt needs to be scheduled with RMR in 3 months per SF.Pt has 3 month with AB

## 2017-02-18 NOTE — Telephone Encounter (Signed)
Pt's wife called to say that Larry Mullins hasn't received his antibiotic prescription from [place] yet. Please advise.

## 2017-02-18 NOTE — Progress Notes (Signed)
Orders were put in 02/16/17. Will call [place].

## 2017-02-19 ENCOUNTER — Encounter: Payer: Self-pay | Admitting: General Surgery

## 2017-02-19 ENCOUNTER — Ambulatory Visit (INDEPENDENT_AMBULATORY_CARE_PROVIDER_SITE_OTHER): Payer: Self-pay | Admitting: General Surgery

## 2017-02-19 VITALS — BP 105/60 | HR 89 | Temp 99.1°F | Ht 68.0 in | Wt 180.0 lb

## 2017-02-19 DIAGNOSIS — E663 Overweight: Secondary | ICD-10-CM | POA: Diagnosis not present

## 2017-02-19 DIAGNOSIS — Z09 Encounter for follow-up examination after completed treatment for conditions other than malignant neoplasm: Secondary | ICD-10-CM

## 2017-02-19 DIAGNOSIS — Z6826 Body mass index (BMI) 26.0-26.9, adult: Secondary | ICD-10-CM | POA: Diagnosis not present

## 2017-02-19 DIAGNOSIS — R001 Bradycardia, unspecified: Secondary | ICD-10-CM | POA: Diagnosis not present

## 2017-02-19 DIAGNOSIS — K7460 Unspecified cirrhosis of liver: Secondary | ICD-10-CM | POA: Diagnosis not present

## 2017-02-19 DIAGNOSIS — K922 Gastrointestinal hemorrhage, unspecified: Secondary | ICD-10-CM | POA: Diagnosis not present

## 2017-02-19 NOTE — Telephone Encounter (Signed)
Reminder in epic. OV made with AB. Per SF it could be with RMR or AB in 3 months.

## 2017-02-19 NOTE — Telephone Encounter (Signed)
Per Almyra Free, there is no longer a Rep for Pylera.

## 2017-02-19 NOTE — Telephone Encounter (Signed)
Pylera is too expensive for pt to take for H. Pylori. Is there another medication pt can take?

## 2017-02-19 NOTE — Progress Notes (Signed)
Subjective:     Larry Mullins  Status post hemorrhoidectomy with fissurectomy.  Patient doing well.  Does have mild rectal pain.  Minimal blood on the toilet paper when he wipes himself.  He is not having active bleeding.  He does not have incontinence. Objective:    BP 105/60   Pulse 89   Temp 99.1 F (37.3 C)   Ht 5\' 8"  (1.727 m)   Wt 180 lb (81.6 kg)   BMI 27.37 kg/m   General:  alert, cooperative and no distress  Rectum healing well.  No active bleeding noted. Final pathology consistent with diagnosis.     Assessment:    Doing well postoperatively.    Plan:  Keep anus clean and dry.  Follow-up here in 3 weeks.

## 2017-02-19 NOTE — Telephone Encounter (Signed)
Contact PYLERA FOR PT ASSISTANCE.

## 2017-02-19 NOTE — Telephone Encounter (Signed)
CONTACT REP TO GET SAMPLES FOR 10 DAY COURSE.

## 2017-02-20 NOTE — Telephone Encounter (Signed)
lmom for pt to return call. Started working on pt assistance paperwork for [employer].

## 2017-02-25 ENCOUNTER — Telehealth: Payer: Self-pay

## 2017-02-25 NOTE — Telephone Encounter (Signed)
Spouse returned call. Spouse is trying to work with her insurance carries. pts spouse states that they have the wrong information entered for their deductible, causing pts medication cost to increase. Paperwork for pt assistance has also been started to get medication covered. Pt will stop by Monday to sign papers and bring a copy of income.

## 2017-02-25 NOTE — Telephone Encounter (Signed)
Opened in error

## 2017-02-25 NOTE — Telephone Encounter (Signed)
Lmom, waiting on a return call.  

## 2017-02-26 ENCOUNTER — Telehealth: Payer: Self-pay | Admitting: Internal Medicine

## 2017-02-26 NOTE — Telephone Encounter (Signed)
pts spouse called to tell, pts spouse has RX D plan [phone]. Pt s spouse will come in Monday 02/26/17 to sign pt assistance forms.

## 2017-02-26 NOTE — Telephone Encounter (Signed)
Pt's wife called to say she was returning a call from AM. Please call patient at [phone]

## 2017-03-02 ENCOUNTER — Ambulatory Visit: Payer: Medicare Other | Admitting: Gastroenterology

## 2017-03-02 DIAGNOSIS — F0390 Unspecified dementia without behavioral disturbance: Secondary | ICD-10-CM | POA: Diagnosis not present

## 2017-03-02 DIAGNOSIS — Z79899 Other long term (current) drug therapy: Secondary | ICD-10-CM | POA: Diagnosis not present

## 2017-03-02 DIAGNOSIS — G4700 Insomnia, unspecified: Secondary | ICD-10-CM | POA: Diagnosis not present

## 2017-03-02 DIAGNOSIS — I10 Essential (primary) hypertension: Secondary | ICD-10-CM | POA: Diagnosis not present

## 2017-03-09 ENCOUNTER — Ambulatory Visit (INDEPENDENT_AMBULATORY_CARE_PROVIDER_SITE_OTHER): Payer: Medicare Other | Admitting: Cardiovascular Disease

## 2017-03-09 ENCOUNTER — Encounter: Payer: Self-pay | Admitting: Cardiovascular Disease

## 2017-03-09 VITALS — BP 104/70 | HR 101 | Ht 68.0 in | Wt 175.8 lb

## 2017-03-09 DIAGNOSIS — I209 Angina pectoris, unspecified: Secondary | ICD-10-CM

## 2017-03-09 DIAGNOSIS — K625 Hemorrhage of anus and rectum: Secondary | ICD-10-CM

## 2017-03-09 DIAGNOSIS — I25708 Atherosclerosis of coronary artery bypass graft(s), unspecified, with other forms of angina pectoris: Secondary | ICD-10-CM | POA: Diagnosis not present

## 2017-03-09 DIAGNOSIS — E785 Hyperlipidemia, unspecified: Secondary | ICD-10-CM | POA: Diagnosis not present

## 2017-03-09 DIAGNOSIS — Z9289 Personal history of other medical treatment: Secondary | ICD-10-CM

## 2017-03-09 DIAGNOSIS — Z951 Presence of aortocoronary bypass graft: Secondary | ICD-10-CM | POA: Diagnosis not present

## 2017-03-09 NOTE — Progress Notes (Signed)
SUBJECTIVE: The patient returns for follow-up after being hospitalized in January 2019 for rectal bleeding.  He has a history of coronary artery disease and underwent CABG in 1999 at [facility].  Most recent echocardiogram performed in August 2017 demonstrated normal left ventricular systolic function and regional wall motion, LVEF 55-60%.  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  He underwent hemorrhoidectomy with fissurectomy.  The patient denies any symptoms of chest pain, palpitations, shortness of breath, lightheadedness, dizziness, leg swelling, orthopnea, PND, and syncope.  He has been doing quite a bit of walking and also does push-ups.    Review of Systems: As per "subjective", otherwise negative.  Allergies  Allergen Reactions  . Lopressor [Metoprolol Tartrate] Hives    Current Outpatient Medications  Medication Sig Dispense Refill  . alprazolam (XANAX) 2 MG tablet Take 1 tablet by mouth 4 (four) times daily.    [name] aspirin EC 81 MG tablet Take 81 mg by mouth daily.    [name] atenolol (TENORMIN) 25 MG tablet Take 0.5 tablets (12.5 mg total) by mouth at bedtime. 30 tablet 1  . atorvastatin (LIPITOR) 10 MG tablet Take 10 mg by mouth daily.     [name] bismuth-metronidazole-tetracycline (PYLERA) 140-125-125 MG capsule 3 PO QID FOR 10 DAYS 120 capsule 0  . buPROPion (WELLBUTRIN) 100 MG tablet Take 0.5 tablets by mouth 2 (two) times daily.     [name] HYDROcodone-acetaminophen (NORCO) 10-325 MG tablet Take 1 tablet by mouth 4 (four) times daily as needed for moderate pain.     . memantine (NAMENDA) 10 MG tablet Take 10 mg by mouth 2 (two) times daily.    . pantoprazole (PROTONIX) 40 MG tablet Take 1 tablet (40 mg total) by mouth 2 (two) times daily before a meal. 60 tablet 1  . polyethylene glycol (MIRALAX / GLYCOLAX) packet Take 17 g by mouth daily. 14 each 0  . QUEtiapine (SEROQUEL) 200 MG tablet Take 200 mg by mouth at bedtime.    . traMADol (ULTRAM) 50 MG tablet  Take 1 tablet by mouth 4 (four) times daily.     [name] warfarin (COUMADIN) 3 MG tablet Take 1 tablet (3 mg total) by mouth daily at 6 PM. 30 tablet 0   No current facility-administered medications for this visit.     Past Medical History:  Diagnosis Date  . Avascular necrosis of bone of hip, left ([place])   . CAD (coronary artery disease)    a. s/p CABG in 1999.  [name] Dementia   . Diverticulosis   . Hyperlipidemia   . Hypertension   . Superior mesenteric vein thrombosis     Past Surgical History:  Procedure Laterality Date  . BIOPSY  02/10/2017   Procedure: BIOPSY;  Surgeon: Danie Binder, MD;  [facility];  Service: Endoscopy;;  gastric biopsy   . COLONOSCOPY WITH PROPOFOL N/A 02/10/2017   Procedure: COLONOSCOPY WITH PROPOFOL;  Surgeon: Danie Binder, MD;  [facility];  Service: Endoscopy;  Laterality: N/A;  . 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 no stents per patient  . ESOPHAGOGASTRODUODENOSCOPY (EGD) WITH PROPOFOL N/A 02/10/2017   Procedure: ESOPHAGOGASTRODUODENOSCOPY (EGD) WITH PROPOFOL;  Surgeon: Danie Binder, MD;  [facility];  Service: Endoscopy;  Laterality: N/A;  possible dilation   . HEMORRHOID SURGERY N/A 02/11/2017   Procedure: HEMORRHOIDECTOMY;  Surgeon: Aviva Signs, MD;  Location: [facility];  Service: General;  Laterality: N/A;  Social History   Socioeconomic History  . Marital status: Married    Spouse name: Not on file  . Number of children: Not on file  . Years of education: Not on file  . Highest education level: Not on file  Social Needs  . Financial resource strain: Not on file  . Food insecurity - worry: Not on file  . Food insecurity - inability: Not on file  . Transportation needs - medical: Not on file  . Transportation needs - non-medical: Not on file  Occupational History  . Not on file  Tobacco Use  . Smoking status: Former Smoker    Types: Cigarettes  . Smokeless tobacco: Former Systems developer    Quit  date: 1999  Substance and Sexual Activity  . 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 Sexual activity: Not on file  Other Topics Concern  . Not on file  Social History Narrative  . Not on file     Vitals:   03/09/17 1043 03/09/17 1051  BP: 110/76 104/70  Pulse: (!) 101   SpO2: 97%   Weight: 175 lb 12.8 oz (79.7 kg)   Height: 5\' 8"  (1.727 m)     Wt Readings from Last 3 Encounters:  03/09/17 175 lb 12.8 oz (79.7 kg)  02/19/17 180 lb (81.6 kg)  02/11/17 173 lb (78.5 kg)     PHYSICAL EXAM General: NAD HEENT: Normal. Neck: No JVD, no thyromegaly. Lungs: Clear to auscultation bilaterally with normal respiratory effort. CV: Regular rate and rhythm, normal S1/S2, no S3/S4, no murmur. No pretibial or periankle edema.  No carotid bruit.   Abdomen: Soft, nontender, no distention.  Neurologic: Alert and oriented.  Psych: Normal affect. Skin: Normal. Musculoskeletal: No gross deformities.    ECG: Most recent ECG reviewed.   Labs: Lab Results  Component Value Date/Time   K 3.9 02/08/2017 05:48 AM   BUN 11 02/08/2017 05:48 AM   CREATININE 1.07 02/08/2017 05:48 AM   ALT 26 02/06/2017 03:51 PM   HGB 13.7 02/12/2017 07:41 AM     Lipids: No results found for: LDLCALC, LDLDIRECT, CHOL, TRIG, HDL     ASSESSMENT AND PLAN: 1.  Coronary disease with history of CABG in 1999: Symptomatically stable.  I will obtain a Lexiscan Myoview stress test for ischemic surveillance as he has not had a stress test since prior to undergoing bypass surgery.  2.  Hyperlipidemia: Continue Lipitor 20 mg daily.  This was recently titrated upwards in January 2019.  3.  Rectal bleeding status post hemorrhoidectomy and fissurectomy: Symptomatically stable.  No further bleeding.   Disposition: Follow up 6 months   Kate Sable, M.D., F.A.C.C.

## 2017-03-09 NOTE — Patient Instructions (Signed)
Your physician wants you to follow-up in: 6 months with Dr.Koneswaran You will receive a reminder letter in the mail two months in advance. If you don't receive a letter, please call our office to schedule the follow-up appointment.  Your physician has requested that you have a lexiscan myoview. For further information please visit [email or web address]. Please follow instruction sheet, as given.   Your physician recommends that you continue on your current medications as directed. Please refer to the Current Medication list given to you today.   If you need a refill on your cardiac medications before your next appointment, please call your pharmacy.    No lab work ordered.     Thank you for choosing [facility] !

## 2017-03-12 ENCOUNTER — Ambulatory Visit (INDEPENDENT_AMBULATORY_CARE_PROVIDER_SITE_OTHER): Payer: Self-pay | Admitting: General Surgery

## 2017-03-12 ENCOUNTER — Encounter: Payer: Self-pay | Admitting: General Surgery

## 2017-03-12 VITALS — BP 96/63 | HR 74 | Temp 98.4°F | Ht 68.0 in | Wt 171.0 lb

## 2017-03-12 DIAGNOSIS — Z09 Encounter for follow-up examination after completed treatment for conditions other than malignant neoplasm: Secondary | ICD-10-CM

## 2017-03-12 NOTE — Progress Notes (Signed)
Subjective:     Larry Mullins  Status post hemorrhoidectomy.  Doing well.  No further episodes of bleeding.  His Coumadin is being adjusted by his primary care physician. Objective:    BP 96/63   Pulse 74   Temp 98.4 F (36.9 C)   Ht 5\' 8"  (1.727 m)   Wt 171 lb (77.6 kg)   BMI 26.00 kg/m   General:  alert, cooperative and no distress  Rectum healing well.  No bleeding noted.     Assessment:    Doing well postoperatively.    Plan:   Follow-up as needed.

## 2017-03-30 DIAGNOSIS — G894 Chronic pain syndrome: Secondary | ICD-10-CM | POA: Diagnosis not present

## 2017-03-30 DIAGNOSIS — E663 Overweight: Secondary | ICD-10-CM | POA: Diagnosis not present

## 2017-03-30 DIAGNOSIS — K7469 Other cirrhosis of liver: Secondary | ICD-10-CM | POA: Diagnosis not present

## 2017-03-30 DIAGNOSIS — Z6826 Body mass index (BMI) 26.0-26.9, adult: Secondary | ICD-10-CM | POA: Diagnosis not present

## 2017-03-30 DIAGNOSIS — K551 Chronic vascular disorders of intestine: Secondary | ICD-10-CM | POA: Diagnosis not present

## 2017-03-30 DIAGNOSIS — Z7901 Long term (current) use of anticoagulants: Secondary | ICD-10-CM | POA: Diagnosis not present

## 2017-03-30 DIAGNOSIS — G4700 Insomnia, unspecified: Secondary | ICD-10-CM | POA: Diagnosis not present

## 2017-03-30 LAB — PROTIME-INR
INR: 1 (ref 0.8–1.2)
PROTHROMBIN TIME: 10.5 s (ref 9.1–12.0)

## 2017-04-08 ENCOUNTER — Ambulatory Visit: Payer: Medicare Other | Admitting: Urology

## 2017-04-28 DIAGNOSIS — F419 Anxiety disorder, unspecified: Secondary | ICD-10-CM | POA: Diagnosis not present

## 2017-04-28 DIAGNOSIS — Z79899 Other long term (current) drug therapy: Secondary | ICD-10-CM | POA: Diagnosis not present

## 2017-04-28 DIAGNOSIS — G4700 Insomnia, unspecified: Secondary | ICD-10-CM | POA: Diagnosis not present

## 2017-04-28 DIAGNOSIS — I10 Essential (primary) hypertension: Secondary | ICD-10-CM | POA: Diagnosis not present

## 2017-04-29 ENCOUNTER — Other Ambulatory Visit (HOSPITAL_COMMUNITY): Payer: Medicare Other

## 2017-04-29 ENCOUNTER — Encounter (HOSPITAL_COMMUNITY): Payer: Medicare Other

## 2017-04-30 ENCOUNTER — Encounter: Payer: Self-pay | Admitting: Gastroenterology

## 2017-04-30 ENCOUNTER — Telehealth: Payer: Self-pay

## 2017-04-30 ENCOUNTER — Ambulatory Visit (INDEPENDENT_AMBULATORY_CARE_PROVIDER_SITE_OTHER): Payer: Medicare Other | Admitting: Gastroenterology

## 2017-04-30 VITALS — BP 124/74 | HR 65 | Temp 97.5°F | Ht 68.0 in | Wt 174.6 lb

## 2017-04-30 DIAGNOSIS — B9681 Helicobacter pylori [H. pylori] as the cause of diseases classified elsewhere: Secondary | ICD-10-CM | POA: Diagnosis not present

## 2017-04-30 DIAGNOSIS — K2970 Gastritis, unspecified, without bleeding: Secondary | ICD-10-CM | POA: Diagnosis not present

## 2017-04-30 DIAGNOSIS — R932 Abnormal findings on diagnostic imaging of liver and biliary tract: Secondary | ICD-10-CM

## 2017-04-30 DIAGNOSIS — I25708 Atherosclerosis of coronary artery bypass graft(s), unspecified, with other forms of angina pectoris: Secondary | ICD-10-CM

## 2017-04-30 NOTE — Progress Notes (Signed)
Referring Provider: Redmond School, MD Primary Care Physician:  Redmond School, MD  Primary GI: Dr. Gala Romney   Chief Complaint  Patient presents with  . Rectal Bleeding    with BM last night, bright red    HPI:   Larry Mullins is a 67 y.o. male presenting today with a history of acute superior mesenteric vein thrombosis in Aug 2017 with associated ascites, question of cirrhosis on imaging. Recently inpatient with rectal bleeding, s/p colonoscopy/EGD.  Colonoscopy with normal TI, diverticulosis in rectosigmoid colon, sigmoid, descending, and ascending colon. Rectal bleeding likely due to inflamed hemorrhoids. EGD with widely patent Schatzki's ring s/p dilation, H.pylori gastritis. Prescribed Pylera. He underwent hemorrhoidectomy and fissurectomy while inpatient due to bleeding hemorrhoids. On anticoagulation chronically. US abdomen Jan 2019 with increased hepatic echogenicity, normal spleen. LFTs overall normal. No thrombocytopenia. Hep C antibody negative, negative Hep B. Needs Hep A/B vaccinations. Doubt advanced chronic liver disease despite suggestion of possible cirrhosis on outside imaging several years ago, and most recent imaging without advanced appearing disease. EGD without stigmata of advanced liver disease.   Never was able to get antibiotics due to insurance. Not wanting to do stress test due to concern for medical bills. Wife states  she spoke with Dr. Gerarda Fraction about this. Forgot to take stool softener, not taking Miralax and Metamucil routinely. Yesterday evening with BM noted low volume blood. Wife thinks likely was from a tear. Wife states he went for a walk and went to sister-in-laws house, scared wife.   Has not been taking Protonix every day. Noted some mild dysphagia. Wife asking me to remind him he needs this daily.    Past Medical History:  Diagnosis Date  . Avascular necrosis of bone of hip, left ([place])   . CAD (coronary artery disease)    a. s/p CABG in 1999.   [name] Dementia   . Diverticulosis   . Hyperlipidemia   . Hypertension   . Superior mesenteric vein thrombosis     Past Surgical History:  Procedure Laterality Date  . BIOPSY  02/10/2017   Procedure: BIOPSY;  Surgeon: Danie Binder, MD;  [facility];  Service: Endoscopy;;  gastric biopsy   . COLONOSCOPY WITH PROPOFOL N/A 02/10/2017   Colonoscopy with normal TI, diverticulosis in rectosigmoid colon, sigmoid, descending, and ascending colon. Rectal bleeding likely due to inflamed hemorrhoids.   . 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 no stents per patient  . ESOPHAGOGASTRODUODENOSCOPY (EGD) WITH PROPOFOL N/A 02/10/2017   widely patent Schatzki's ring s/p dilation, H.pylori gastritis.  [name] HEMORRHOID SURGERY N/A 02/11/2017   Procedure: HEMORRHOIDECTOMY;  Surgeon: Aviva Signs, MD;  Location: [facility];  Service: General;  Laterality: N/A;    Current Outpatient Medications  Medication Sig Dispense Refill  . alprazolam (XANAX) 2 MG tablet Take 1 mg by mouth 4 (four) times daily.     [name] aspirin EC 81 MG tablet Take 81 mg by mouth daily.    [name] atorvastatin (LIPITOR) 10 MG tablet Take 10 mg by mouth daily.     [name] buPROPion (WELLBUTRIN) 100 MG tablet Take 0.5 tablets by mouth 2 (two) times daily.     [name] HYDROcodone-acetaminophen (NORCO) 10-325 MG tablet Take 1 tablet by mouth 4 (four) times daily as needed for moderate pain.     . memantine (NAMENDA) 10 MG tablet Take 10 mg by mouth 2 (two) times daily.    . psyllium (METAMUCIL) 58.6 % powder Take 1  packet by mouth daily.    . QUEtiapine (SEROQUEL) 200 MG tablet Take 200 mg by mouth at bedtime.    . traMADol (ULTRAM) 50 MG tablet Take 1 tablet by mouth 4 (four) times daily.     [name] warfarin (COUMADIN) 3 MG tablet Take 1 tablet (3 mg total) by mouth daily at 6 PM. 30 tablet 0  . pantoprazole (PROTONIX) 40 MG tablet Take 1 tablet (40 mg total) by mouth 2 (two) times daily before a meal. (Patient not taking: Reported on 04/30/2017) 60  tablet 1  . polyethylene glycol (MIRALAX / GLYCOLAX) packet Take 17 g by mouth daily. (Patient not taking: Reported on 04/30/2017) 14 each 0   No current facility-administered medications for this visit.     Allergies as of 04/30/2017 - Review Complete 04/30/2017  Allergen Reaction Noted  . Lopressor [metoprolol tartrate] Hives 09/05/2015    Family History  Problem Relation Age of Onset  . Colon polyps Sister        unsure age of surgery, had to have colon resection   . Colon cancer Neg Hx   . Liver disease Neg Hx     Social History   Socioeconomic History  . Marital status: Married    Spouse name: Not on file  . Number of children: Not on file  . Years of education: Not on file  . Highest education level: Not on file  Occupational History  . Not on file  Social Needs  . Financial resource strain: Not on file  . Food insecurity:    Worry: Not on file    Inability: Not on file  . Transportation needs:    Medical: Not on file    Non-medical: Not on file  Tobacco Use  . Smoking status: Former Smoker    Types: Cigarettes  . Smokeless tobacco: Former Systems developer    Quit date: 1999  Substance and Sexual Activity  . 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 Sexual activity: Not on file  Lifestyle  . Physical activity:    Days per week: Not on file    Minutes per session: Not on file  . Stress: Not on file  Relationships  . Social connections:    Talks on phone: Not on file    Gets together: Not on file    Attends religious service: Not on file    Active member of club or organization: Not on file    Attends meetings of clubs or organizations: Not on file    Relationship status: Not on file  Other Topics Concern  . Not on file  Social History Narrative  . Not on file    Review of Systems: As mentioned in HPI   Physical  Exam: BP 124/74   Pulse 65   Temp (!) 97.5 F (36.4 C) (Oral)   Ht 5\' 8"  (1.727 m)   Wt 174 lb 9.6 oz (79.2 kg)   BMI 26.55 kg/m  General:   Alert and oriented. No distress noted. Pleasant and cooperative.  Head:  Normocephalic and atraumatic. Eyes:  Conjuctiva clear without scleral icterus. Mouth:  Oral mucosa pink and moist. Good dentition. No lesions. Abdomen:  +BS, soft, non-tender and non-distended. No rebound or guarding. No HSM or masses noted. Msk:  Symmetrical without gross deformities. Normal  posture. Extremities:  Without edema. Neurologic:  Alert and  oriented x4 Psych:  Alert and cooperative. Normal mood and affect.

## 2017-04-30 NOTE — Patient Instructions (Signed)
Please take the Protonix each morning, 30 minutes before breakfast.  Take the Metamucil and Miralax daily.  We are working on getting the antibiotics covered.  I have given you a prescription for the vaccinations, to protect you again Hep A/B.  We will keep tabs on your liver and liver numbers. For right now, I would not call this cirrhosis but unable to exclude underlying liver disease at this time.  We will see you in 3 months!  It was a pleasure to see you today. I strive to create trusting relationships with patients to provide genuine, compassionate, and quality care. I value your feedback. If you receive a survey regarding your visit,  I greatly appreciate you taking time to fill this out.   Annitta Needs, PhD, ANP-BC [facility]

## 2017-04-30 NOTE — Telephone Encounter (Signed)
Called to speak with [employer] Pt assistance Rep. Rep gave me an expedited fax number [phone] to fax pts paper work to. First papers were faxed on 03/04/17. Waiting to hear back from pt assistance.

## 2017-05-01 NOTE — Assessment & Plan Note (Signed)
Unfortunately, he has been unable to take Pylera due to insurance coverage. Office staff working on this now. We will try to obtain samples. We may need to order this with generic forms if possible. Continue Protonix once each morning. Will await word from staff regarding Pylera samples.

## 2017-05-01 NOTE — Assessment & Plan Note (Signed)
Question of cirrhosis on prior imaging, US abdomen Jan 2019 with increased hepatic echogenicity, normal spleen. LFTs overall normal. No thrombocytopenia. Hep C antibody negative, negative Hep B. Needs Hep A/B vaccinations. Doubt advanced chronic liver disease despite suggestion of possible cirrhosis on outside imaging several years ago, and most recent imaging without advanced appearing disease. EGD without stigmata of advanced liver disease. I would be hesitant to label him with cirrhosis, but we will follow him serially. If any fluctuations in LFTs, will pursue further serologies. Plan on [place] in 6 months, return here in 3 months. Provided Hep A/B vaccination today.

## 2017-05-04 NOTE — Progress Notes (Signed)
cc'ed to pcp °

## 2017-05-05 NOTE — Telephone Encounter (Signed)
Spoke with [employer] rep and they needed an RX faxed for the Pylera. RX faxed to [phone]. Waiting on an approval or denial.

## 2017-05-14 ENCOUNTER — Telehealth: Payer: Self-pay | Admitting: Gastroenterology

## 2017-05-14 NOTE — Telephone Encounter (Signed)
Spoke to [employer] and the medication was approved on 05/06/17. Medication is in route for delivery to our office by next week. Pt's spouse is aware that I will call her when the medication arrives.

## 2017-05-14 NOTE — Telephone Encounter (Signed)
Has he received Pylera?

## 2017-05-18 ENCOUNTER — Telehealth: Payer: Self-pay

## 2017-05-18 MED ORDER — BIS SUBCIT-METRONID-TETRACYC 140-125-125 MG PO CAPS: 3.0000 | ORAL_CAPSULE | Freq: Three times a day (TID) | ORAL | 0 refills | Status: DC

## 2017-05-18 NOTE — Telephone Encounter (Signed)
Ok. Make sure he is taking Protonix twice a day.  HE IS ON COUMADIN: the antibiotics in Pylera with Coumadin can cause increased bleeding risk, increased INR. Take 1/2 Coumadin tablet while taking the antibiotics and very important he has a PT/INR checked 4 days after starting antibiotics. Who manages his Coumadin? We also need to let them know he will be taking this in case it needs to be adjusted further.  I am not here Thursday or Friday, and it would be best for patient to actually start the medication possibly Friday. This way, he can have the INR checked middle of next week and we can be able to follow-up on this (instead of on a weekend or time I'm not here).

## 2017-05-18 NOTE — Telephone Encounter (Signed)
AB, Pts Pylera arrived in office today.

## 2017-05-18 NOTE — Telephone Encounter (Signed)
Pt's spouse notified of medication. Pts spouse will pick medication up Friday April 26. Pt's spouse is aware of how pt should take his Pylera, reduce Coumadin and take Protonix twice daily. Will contact Dr. Levy Sjogren office to make him aware of the medication Pylera and Coumadin.

## 2017-05-18 NOTE — Telephone Encounter (Signed)
Dr. Toma Deiters is the pts provider that manages his Coumadin. Dr. Jerolyn Shin is out the office this week and the nurse is going to let the on call physician know and call back.

## 2017-05-18 NOTE — Addendum Note (Signed)
Addended by: Annitta Needs on: 05/18/2017 03:38 PM   Modules accepted: Orders

## 2017-05-19 NOTE — Telephone Encounter (Signed)
AB, received a call from [facility], Dr. Nolon Rod office. Ok per [place] PA in the absence of Dr. Gerarda Fraction. for pt to take 1/2 Coumadin while on Plyera. No additional adjustments needed while on Pylera.

## 2017-05-22 ENCOUNTER — Other Ambulatory Visit: Payer: Self-pay

## 2017-05-22 DIAGNOSIS — K2970 Gastritis, unspecified, without bleeding: Principal | ICD-10-CM

## 2017-05-22 DIAGNOSIS — B9681 Helicobacter pylori [H. pylori] as the cause of diseases classified elsewhere: Secondary | ICD-10-CM

## 2017-05-22 DIAGNOSIS — A048 Other specified bacterial intestinal infections: Secondary | ICD-10-CM

## 2017-05-22 NOTE — Telephone Encounter (Signed)
Pt's spouse picked up Pylera in office today. PT/INR orders were placed, released and copy was given to spouse. PT will have lab work done 4 days after being on Pylera.

## 2017-05-25 NOTE — Telephone Encounter (Signed)
Yes I spoke with his spouse Friday and gave her the orders then. I will check back with her.

## 2017-05-25 NOTE — Telephone Encounter (Signed)
Larry Mullins: just want to make sure patient gets INR done early this week as he is on Pylera and decreased Coumadin dosing.

## 2017-05-25 NOTE — Telephone Encounter (Signed)
spoke with spouse and she is aware that lab work should be done.

## 2017-05-28 ENCOUNTER — Other Ambulatory Visit: Payer: Self-pay | Admitting: Gastroenterology

## 2017-05-28 DIAGNOSIS — B9681 Helicobacter pylori [H. pylori] as the cause of diseases classified elsewhere: Secondary | ICD-10-CM | POA: Diagnosis not present

## 2017-05-28 DIAGNOSIS — K2970 Gastritis, unspecified, without bleeding: Secondary | ICD-10-CM | POA: Diagnosis not present

## 2017-05-29 ENCOUNTER — Telehealth: Payer: Self-pay | Admitting: *Deleted

## 2017-05-29 LAB — PROTIME-INR
INR: 1.1 (ref 0.8–1.2)
PROTHROMBIN TIME: 11.8 s (ref 9.1–12.0)

## 2017-05-29 NOTE — Telephone Encounter (Signed)
Lab results received and placed in your look at [facility] Thanks

## 2017-06-01 NOTE — Telephone Encounter (Signed)
Received and addressed in separate phone note.

## 2017-06-01 NOTE — Telephone Encounter (Signed)
Received INR at 1.1 dated 5/2. We had decreased Coumadin dosing as per plan due to risk of bleeding while on antibiotics. He needs to go back to his regular dosing if he hasn't already. Does he have his INR checked weekly? Does PCP follow this weekly? I'm not sure what his goal INR is, but he is not therapeutic.

## 2017-06-02 NOTE — Telephone Encounter (Signed)
Lmom, waiting pn spouse to return call.

## 2017-06-04 NOTE — Telephone Encounter (Signed)
I called pt and told him to go back on regular dosing of Coumadin. He did not know when he is supposed to see Dr. Gerarda Fraction for checking PT/INR again. I called Dr. Nolon Rod office and spoke to [place] and explained we had decreased his coumadin to 1/2 of the 3 mg tablet while on the Pylera. Pt knows to go back on the 3 mg daily.   I am faxing the lab to Dr. Gerarda Fraction and they will contact pt to follow up on PT/INR.   Faxing info to [phone] [place] [facility].

## 2017-06-04 NOTE — Telephone Encounter (Signed)
FYI to AB.  

## 2017-06-08 NOTE — Telephone Encounter (Signed)
Pts spouse called back and said someone spoke with her husband and he can't remember what was said. Went over instructions given to pt.

## 2017-07-06 DIAGNOSIS — F419 Anxiety disorder, unspecified: Secondary | ICD-10-CM | POA: Diagnosis not present

## 2017-07-06 DIAGNOSIS — G894 Chronic pain syndrome: Secondary | ICD-10-CM | POA: Diagnosis not present

## 2017-07-06 DIAGNOSIS — M1991 Primary osteoarthritis, unspecified site: Secondary | ICD-10-CM | POA: Diagnosis not present

## 2017-07-06 DIAGNOSIS — G4700 Insomnia, unspecified: Secondary | ICD-10-CM | POA: Diagnosis not present

## 2017-07-06 DIAGNOSIS — M2550 Pain in unspecified joint: Secondary | ICD-10-CM | POA: Diagnosis not present

## 2017-07-06 DIAGNOSIS — Z6826 Body mass index (BMI) 26.0-26.9, adult: Secondary | ICD-10-CM | POA: Diagnosis not present

## 2017-07-06 DIAGNOSIS — F330 Major depressive disorder, recurrent, mild: Secondary | ICD-10-CM | POA: Diagnosis not present

## 2017-07-06 DIAGNOSIS — E663 Overweight: Secondary | ICD-10-CM | POA: Diagnosis not present

## 2017-07-06 DIAGNOSIS — Z1389 Encounter for screening for other disorder: Secondary | ICD-10-CM | POA: Diagnosis not present

## 2017-07-27 DIAGNOSIS — G4700 Insomnia, unspecified: Secondary | ICD-10-CM | POA: Diagnosis not present

## 2017-07-27 DIAGNOSIS — Z79899 Other long term (current) drug therapy: Secondary | ICD-10-CM | POA: Diagnosis not present

## 2017-07-27 DIAGNOSIS — I10 Essential (primary) hypertension: Secondary | ICD-10-CM | POA: Diagnosis not present

## 2017-07-27 DIAGNOSIS — F419 Anxiety disorder, unspecified: Secondary | ICD-10-CM | POA: Diagnosis not present

## 2017-08-13 ENCOUNTER — Encounter: Payer: Self-pay | Admitting: Gastroenterology

## 2017-08-13 ENCOUNTER — Encounter: Payer: Self-pay | Admitting: *Deleted

## 2017-08-13 ENCOUNTER — Ambulatory Visit (INDEPENDENT_AMBULATORY_CARE_PROVIDER_SITE_OTHER): Payer: Medicare Other | Admitting: Gastroenterology

## 2017-08-13 VITALS — BP 109/65 | HR 66 | Temp 97.0°F | Ht 68.0 in | Wt 170.4 lb

## 2017-08-13 DIAGNOSIS — R932 Abnormal findings on diagnostic imaging of liver and biliary tract: Secondary | ICD-10-CM | POA: Diagnosis not present

## 2017-08-13 DIAGNOSIS — B9681 Helicobacter pylori [H. pylori] as the cause of diseases classified elsewhere: Secondary | ICD-10-CM | POA: Diagnosis not present

## 2017-08-13 DIAGNOSIS — I25708 Atherosclerosis of coronary artery bypass graft(s), unspecified, with other forms of angina pectoris: Secondary | ICD-10-CM

## 2017-08-13 DIAGNOSIS — K2970 Gastritis, unspecified, without bleeding: Secondary | ICD-10-CM

## 2017-08-13 NOTE — Assessment & Plan Note (Signed)
Question of cirrhosis on imaging in the past, with US abdomen Jan 2019 with normal spleen and no obvious cirrhosis. LFTs overall normal, no thrombocytopenia, no stigmata of advanced liver disease on EGD recently. Repeat US now and if normal, will follow clinically. If any bumps in LFTs, pursue further serologies. Completing Hep A/B vaccinations currently. Return in Dec 2019.

## 2017-08-13 NOTE — Assessment & Plan Note (Signed)
History of H.pylori s/p treatment. Check urea breath test after holding PPI for 2 weeks, then may resume Protonix.

## 2017-08-13 NOTE — Progress Notes (Signed)
cc'ed to pcp °

## 2017-08-13 NOTE — Patient Instructions (Addendum)
We have ordered a routine ultrasound. If this is good, we can probably hold off on further imaging unless your liver numbers increase or other signs concerning for liver disease. You can do the breath test on that same day.  To do the breath test: stop Protonix for 14 days before it. Once the breath test is done, go ahead and start taking it again.   We will see you in December 2019, and if you are doing well, we can see you back on a yearly basis!  It was a pleasure to see you today. I strive to create trusting relationships with patients to provide genuine, compassionate, and quality care. I value your feedback. If you receive a survey regarding your visit,  I greatly appreciate you taking time to fill this out.   Annitta Needs, PhD, ANP-BC [facility]

## 2017-08-13 NOTE — Progress Notes (Signed)
Referring Provider: Redmond School, MD Primary Care Physician:  Redmond School, MD Primary GI:  Dr. Gala Romney   Chief Complaint  Patient presents with  . Cirrhosis    HPI:   Larry Mullins is a 67 y.o. 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 Recently inpatient with rectal bleeding, s/p colonoscopy/EGD.  Colonoscopy with normal TI, diverticulosis in rectosigmoid colon, sigmoid, descending, and ascending colon. Rectal bleeding likely due to inflamed hemorrhoids. EGD with widely patent Schatzki's ring s/p dilation, H.pylori gastritis.  He underwent hemorrhoidectomy and fissurectomy while inpatient due to bleeding hemorrhoids. On anticoagulation chronically. US abdomen Jan 2019 with increased hepatic echogenicity, normal spleen. LFTs overall normal. No thrombocytopenia. Hep C antibody negative, negative Hep B. Soon to complete the Hep A/B vaccinations. Doubt advanced chronic liver disease despite suggestion of possible cirrhosis on outside imaging several years ago, and most recent imaging without advanced appearing disease. EGD without stigmata of advanced liver disease.   He was prescribed Plyera but had difficult obtaining. Since last visit, he was able to achieve patient assistance and completed course in April 2019. Need to document eradication. Repeat US due again now. Doubt advanced liver disease but following closely. Further serologies if any fluctuations in LFTs. Good appetite. Weight down a few lbs but not significant from earlier this year. Eating healthier.   Wife and patient not wanting to pursue any further cardiac testing.   Past Medical History:  Diagnosis Date  . Avascular necrosis of bone of hip, left ([place])   . CAD (coronary artery disease)    a. s/p CABG in 1999.  [name] Dementia   . Diverticulosis   . Hyperlipidemia   . Hypertension   . Superior mesenteric  vein thrombosis     Past Surgical History:  Procedure Laterality Date  . BIOPSY  02/10/2017   Procedure: BIOPSY;  Surgeon: Danie Binder, MD;  Location: [facility];  Service: Endoscopy;;  gastric biopsy   . COLONOSCOPY WITH PROPOFOL N/A 02/10/2017   Colonoscopy with normal TI, diverticulosis in rectosigmoid colon, sigmoid, descending, and ascending colon. Rectal bleeding likely due to inflamed hemorrhoids.   . 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 no stents per patient  . ESOPHAGOGASTRODUODENOSCOPY (EGD) WITH PROPOFOL N/A 02/10/2017   widely patent Schatzki's ring s/p dilation, H.pylori gastritis.  [name] HEMORRHOID SURGERY N/A 02/11/2017   Procedure: HEMORRHOIDECTOMY;  Surgeon: Aviva Signs, MD;  Location: [facility];  Service: General;  Laterality: N/A;    Current Outpatient Medications  Medication Sig Dispense Refill  . alprazolam (XANAX) 2 MG tablet Take 1 mg by mouth 4 (four) times daily.     [name] aspirin EC 81 MG tablet Take 81 mg by mouth daily.    [name] atorvastatin (LIPITOR) 10 MG tablet Take 10 mg by mouth daily.     [name] buPROPion (WELLBUTRIN) 100 MG tablet Take 0.5 tablets by mouth 2 (two) times daily.     [name] HYDROcodone-acetaminophen (NORCO) 10-325 MG tablet Take 1 tablet by mouth 4 (four) times daily as needed for moderate pain.     . memantine (NAMENDA) 10 MG tablet Take 10 mg by mouth 2 (two) times daily.    . pantoprazole (PROTONIX) 40 MG tablet Take 1 tablet (40 mg total) by mouth 2 (two) times daily before a meal. 60 tablet 1  . polyethylene glycol (MIRALAX / GLYCOLAX) packet Take 17 g by mouth daily.  14 each 0  . psyllium (METAMUCIL) 58.6 % powder Take 1 packet by mouth daily.    . QUEtiapine (SEROQUEL) 200 MG tablet Take 200 mg by mouth at bedtime.    . traMADol (ULTRAM) 50 MG tablet Take 1 tablet by mouth as needed for moderate pain.     [name] warfarin (COUMADIN) 3 MG tablet Take 1 tablet (3 mg total) by mouth daily at 6 PM. 30 tablet 0   No current  facility-administered medications for this visit.     Allergies as of 08/13/2017 - Review Complete 08/13/2017  Allergen Reaction Noted  . Lopressor [metoprolol tartrate] Hives 09/05/2015    Family History  Problem Relation Age of Onset  . Colon polyps Sister        unsure age of surgery, had to have colon resection   . Colon cancer Neg Hx   . Liver disease Neg Hx     Social History   Socioeconomic History  . Marital status: Married    Spouse name: Not on file  . Number of children: Not on file  . Years of education: Not on file  . Highest education level: Not on file  Occupational History  . Not on file  Social Needs  . Financial resource strain: Not on file  . Food insecurity:    Worry: Not on file    Inability: Not on file  . Transportation needs:    Medical: Not on file    Non-medical: Not on file  Tobacco Use  . Smoking status: Former Smoker    Types: Cigarettes  . Smokeless tobacco: Former Systems developer    Quit date: 1999  Substance and Sexual Activity  . 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 Sexual activity: Not on file  Lifestyle  . Physical activity:    Days per week: Not on file    Minutes per session: Not on file  . Stress: Not on file  Relationships  . Social connections:    Talks on phone: Not on file    Gets together: Not on file    Attends religious service: Not on file    Active member of club or organization: Not on file    Attends meetings of clubs or organizations: Not on file    Relationship status: Not on file  Other Topics Concern  . Not on file  Social History Narrative  . Not on file    Review of Systems: Gen: Denies fever, chills, anorexia. Denies fatigue, weakness, weight loss.  CV: Denies chest pain, palpitations, syncope, peripheral edema, and claudication. Resp: Denies dyspnea at rest,  cough, wheezing, coughing up blood, and pleurisy. GI: see HPI  Derm: Denies rash, itching, dry skin Psych: Denies depression, anxiety, memory loss, confusion. No homicidal or suicidal ideation.  Heme: Denies bruising, bleeding, and enlarged lymph nodes.  Physical Exam: BP 109/65   Pulse 66   Temp (!) 97 F (36.1 C) (Oral)   Ht 5\' 8"  (1.727 m)   Wt 170 lb 6.4 oz (77.3 kg)   BMI 25.91 kg/m  General:   Alert and oriented. No distress noted. Pleasant and cooperative.  Head:  Normocephalic and atraumatic. Eyes:  Conjuctiva clear without scleral icterus. Mouth:  Oral mucosa pink and moist.  Abdomen:  +BS, soft, non-tender and non-distended. No  rebound or guarding. No HSM or masses noted. Msk:  Symmetrical without gross deformities. Normal posture. Extremities:  Without edema. Neurologic:  Alert and  oriented x4 Psych:  Alert and cooperative. Normal mood and affect.

## 2017-08-28 ENCOUNTER — Ambulatory Visit (HOSPITAL_COMMUNITY)
Admission: RE | Admit: 2017-08-28 | Discharge: 2017-08-28 | Disposition: A | Discharge status: Home / Self Care | Payer: Medicare Other | Source: Ambulatory Visit | Attending: Gastroenterology | Admitting: Gastroenterology

## 2017-08-28 DIAGNOSIS — K2970 Gastritis, unspecified, without bleeding: Secondary | ICD-10-CM | POA: Diagnosis not present

## 2017-08-28 DIAGNOSIS — B9681 Helicobacter pylori [H. pylori] as the cause of diseases classified elsewhere: Secondary | ICD-10-CM | POA: Diagnosis not present

## 2017-08-28 DIAGNOSIS — R932 Abnormal findings on diagnostic imaging of liver and biliary tract: Secondary | ICD-10-CM | POA: Diagnosis not present

## 2017-08-28 DIAGNOSIS — Z8719 Personal history of other diseases of the digestive system: Secondary | ICD-10-CM | POA: Diagnosis not present

## 2017-08-31 LAB — H. PYLORI BREATH TEST: H. PYLORI BREATH TEST: NOT DETECTED

## 2017-09-02 NOTE — Progress Notes (Signed)
Liver normal. Will follow clinically.

## 2017-09-02 NOTE — Progress Notes (Signed)
Negative H.pylori. May resume PPI daily if he has not already done so. It is eradicated!

## 2017-09-03 NOTE — Progress Notes (Signed)
Called and informed Vickie.

## 2017-09-03 NOTE — Progress Notes (Signed)
Larry Mullins is aware.  

## 2017-10-07 DIAGNOSIS — I81 Portal vein thrombosis: Secondary | ICD-10-CM | POA: Diagnosis not present

## 2017-10-07 DIAGNOSIS — F0390 Unspecified dementia without behavioral disturbance: Secondary | ICD-10-CM | POA: Diagnosis not present

## 2017-10-07 DIAGNOSIS — G894 Chronic pain syndrome: Secondary | ICD-10-CM | POA: Diagnosis not present

## 2017-10-07 DIAGNOSIS — Z6826 Body mass index (BMI) 26.0-26.9, adult: Secondary | ICD-10-CM | POA: Diagnosis not present

## 2017-10-07 DIAGNOSIS — M1991 Primary osteoarthritis, unspecified site: Secondary | ICD-10-CM | POA: Diagnosis not present

## 2017-10-07 DIAGNOSIS — E663 Overweight: Secondary | ICD-10-CM | POA: Diagnosis not present

## 2017-10-07 DIAGNOSIS — F419 Anxiety disorder, unspecified: Secondary | ICD-10-CM | POA: Diagnosis not present

## 2017-10-30 DIAGNOSIS — I10 Essential (primary) hypertension: Secondary | ICD-10-CM | POA: Diagnosis not present

## 2017-10-30 DIAGNOSIS — Z79899 Other long term (current) drug therapy: Secondary | ICD-10-CM | POA: Diagnosis not present

## 2017-10-30 DIAGNOSIS — F419 Anxiety disorder, unspecified: Secondary | ICD-10-CM | POA: Diagnosis not present

## 2017-10-30 DIAGNOSIS — G4700 Insomnia, unspecified: Secondary | ICD-10-CM | POA: Diagnosis not present

## 2017-12-28 NOTE — Progress Notes (Signed)
Referring Provider: Redmond School, MD Primary Care Physician:  Redmond School, MD  Primary GI: Dr. Gala Romney   Chief Complaint  Patient presents with  . Constipation    HPI:   Larry Mullins is a 67 y.o. 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 Recently inpatient with rectal bleeding, s/p colonoscopy/EGD. Colonoscopy with normal TI, diverticulosis in rectosigmoid colon, sigmoid, descending, and ascending colon. Rectal bleeding likely due to inflamed hemorrhoids. EGD with widely patent Schatzki's ring s/p dilation, H.pylori gastritis.  He underwent hemorrhoidectomy and fissurectomy while inpatient due to bleeding hemorrhoids. On anticoagulation chronically. US abdomen Jan 2019 with increased hepatic echogenicity, normal spleen. LFTs overall normal. No thrombocytopenia. Hep C antibody negative, negative Hep B. Soon to complete the Hep A/B vaccinations. Doubt advanced chronic liver disease despite suggestion of possible cirrhosis on outside imaging several years ago, and most recent imaging without advanced appearing disease. EGD without stigmata of advanced liver disease.   In interim from last appt, urea breath test completed and negative. H.pylori eradicated. RUQ [place] completed Aug 2019 and normal.   Denies constipation. Wife worried that he will take too much Miralax if he forgets he has taken it. Miralax once daily. Wife states he is more forgetful. PPI BID. No GERD exacerbation or dysphagia.   Past Medical History:  Diagnosis Date  . Avascular necrosis of bone of hip, left ([place])   . CAD (coronary artery disease)    a. s/p CABG in 1999.  [name] Dementia ([place])   . Diverticulosis   . Hyperlipidemia   . Hypertension   . Superior mesenteric vein thrombosis     Past Surgical History:  Procedure Laterality Date  . BIOPSY  02/10/2017   Procedure: BIOPSY;  Surgeon:  Danie Binder, MD;  Location: [facility];  Service: Endoscopy;;  gastric biopsy   . COLONOSCOPY WITH PROPOFOL N/A 02/10/2017   Colonoscopy with normal TI, diverticulosis in rectosigmoid colon, sigmoid, descending, and ascending colon. Rectal bleeding likely due to inflamed hemorrhoids.   . 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 no stents per patient  . ESOPHAGOGASTRODUODENOSCOPY (EGD) WITH PROPOFOL N/A 02/10/2017   widely patent Schatzki's ring s/p dilation, H.pylori gastritis.  [name] HEMORRHOID SURGERY N/A 02/11/2017   Procedure: HEMORRHOIDECTOMY;  Surgeon: Aviva Signs, MD;  Location: [facility];  Service: General;  Laterality: N/A;    Current Outpatient Medications  Medication Sig Dispense Refill  . alprazolam (XANAX) 2 MG tablet Take 1 mg by mouth 4 (four) times daily.     [name] aspirin EC 81 MG tablet Take 81 mg by mouth daily.    [name] atorvastatin (LIPITOR) 10 MG tablet Take 10 mg by mouth daily.     [name] buPROPion (WELLBUTRIN) 100 MG tablet Take 0.5 tablets by mouth 2 (two) times daily.     [name] HYDROcodone-acetaminophen (NORCO) 10-325 MG tablet Take 1 tablet by mouth 4 (four) times daily as needed for moderate pain.     . memantine (NAMENDA) 10 MG tablet Take 10 mg by mouth 2 (two) times daily.    . pantoprazole (PROTONIX) 40 MG tablet Take 1 tablet (40 mg total) by mouth 2 (two) times daily before a meal. 60 tablet 1  . polyethylene glycol (MIRALAX / GLYCOLAX) packet Take 17 g by mouth daily. 14 each 0  . psyllium (METAMUCIL) 58.6 % powder Take 1 packet by mouth daily.    [name]  QUEtiapine (SEROQUEL) 200 MG tablet Take 200 mg by mouth at bedtime.    . traMADol (ULTRAM) 50 MG tablet Take 1 tablet by mouth as needed for moderate pain.     [name] warfarin (COUMADIN) 4 MG tablet Take 1 tablet by mouth daily at 12 noon.     No current facility-administered medications for this visit.     Allergies as of 12/29/2017 - Review Complete 08/13/2017  Allergen Reaction Noted  . Lopressor [metoprolol  tartrate] Hives 09/05/2015    Family History  Problem Relation Age of Onset  . Colon polyps Sister        unsure age of surgery, had to have colon resection   . Colon cancer Neg Hx   . Liver disease Neg Hx     Social History   Socioeconomic History  . Marital status: Married    Spouse name: Not on file  . Number of children: Not on file  . Years of education: Not on file  . Highest education level: Not on file  Occupational History  . Not on file  Social Needs  . Financial resource strain: Not on file  . Food insecurity:    Worry: Not on file    Inability: Not on file  . Transportation needs:    Medical: Not on file    Non-medical: Not on file  Tobacco Use  . Smoking status: Former Smoker    Types: Cigarettes  . Smokeless tobacco: Former Systems developer    Quit date: 1999  Substance and Sexual Activity  . 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 Sexual activity: Not on file  Lifestyle  . Physical activity:    Days per week: Not on file    Minutes per session: Not on file  . Stress: Not on file  Relationships  . Social connections:    Talks on phone: Not on file    Gets together: Not on file    Attends religious service: Not on file    Active member of club or organization: Not on file    Attends meetings of clubs or organizations: Not on file    Relationship status: Not on file  Other Topics Concern  . Not on file  Social History Narrative  . Not on file    Review of Systems: Gen: Denies fever, chills, anorexia. Denies fatigue, weakness, weight loss.  CV: Denies chest pain, palpitations, syncope, peripheral edema, and claudication. Resp: Denies dyspnea at rest, cough, wheezing, coughing up blood, and pleurisy. GI: see HPI  Derm: Denies rash, itching, dry skin Psych: Denies depression, anxiety, memory loss, confusion. No  homicidal or suicidal ideation.  Heme: Denies bruising, bleeding, and enlarged lymph nodes.  Physical Exam: BP 119/70   Pulse 77   Temp (!) 97.1 F (36.2 C) (Oral)   Ht 5\' 8"  (1.727 m)   Wt 171 lb (77.6 kg)   BMI 26.00 kg/m  General:   Alert and oriented. No distress noted. Pleasant and cooperative.  Head:  Normocephalic and atraumatic. Eyes:  Conjuctiva clear without scleral icterus. Mouth:  Oral mucosa pink and moist.  Abdomen:  +BS, soft, non-tender and non-distended. No rebound or guarding. No HSM or masses noted. Msk:  Symmetrical without gross deformities. Normal posture. Extremities:  Without edema. Neurologic:  Alert and  oriented  x4 Psych:  Alert and cooperative. Normal mood and affect.

## 2017-12-29 ENCOUNTER — Ambulatory Visit (INDEPENDENT_AMBULATORY_CARE_PROVIDER_SITE_OTHER): Payer: Medicare Other | Admitting: Gastroenterology

## 2017-12-29 ENCOUNTER — Encounter: Payer: Self-pay | Admitting: Gastroenterology

## 2017-12-29 DIAGNOSIS — K219 Gastro-esophageal reflux disease without esophagitis: Secondary | ICD-10-CM

## 2017-12-29 DIAGNOSIS — I25708 Atherosclerosis of coronary artery bypass graft(s), unspecified, with other forms of angina pectoris: Secondary | ICD-10-CM | POA: Diagnosis not present

## 2017-12-29 DIAGNOSIS — K5900 Constipation, unspecified: Secondary | ICD-10-CM | POA: Diagnosis not present

## 2017-12-29 NOTE — Patient Instructions (Signed)
Decrease pantoprazole (Protonix) to once a day, 30 minutes before breakfast on an empty stomach. If you start having flares of reflux, let us know.   Take Miralax once a day.   We will see you back in 1 year!   I enjoyed seeing you again today! As you know, I value our relationship and want to provide genuine, compassionate, and quality care. I welcome your feedback. If you receive a survey regarding your visit,  I greatly appreciate you taking time to fill this out. See you next time!  Annitta Needs, PhD, ANP-BC [facility]

## 2017-12-29 NOTE — Assessment & Plan Note (Signed)
Doing well with Miralax once each day. Due to cognitive decline, wife instructed to administer medications and agreeable/relieved to do this. Return in 1 year or sooner if needed.

## 2017-12-29 NOTE — Assessment & Plan Note (Signed)
History of Schatzki's ring s/p dilation, H.pylori gastritis with documented eradication. No dysphagia. Decrease Protonix to once daily. Return in 1 year.

## 2017-12-29 NOTE — Progress Notes (Signed)
cc'ed to pcp °

## 2018-01-04 DIAGNOSIS — I10 Essential (primary) hypertension: Secondary | ICD-10-CM | POA: Diagnosis not present

## 2018-01-04 DIAGNOSIS — Z681 Body mass index (BMI) 19 or less, adult: Secondary | ICD-10-CM | POA: Diagnosis not present

## 2018-01-04 DIAGNOSIS — Z1389 Encounter for screening for other disorder: Secondary | ICD-10-CM | POA: Diagnosis not present

## 2018-01-04 DIAGNOSIS — Z0001 Encounter for general adult medical examination with abnormal findings: Secondary | ICD-10-CM | POA: Diagnosis not present

## 2018-01-04 DIAGNOSIS — Z23 Encounter for immunization: Secondary | ICD-10-CM | POA: Diagnosis not present

## 2018-03-31 ENCOUNTER — Ambulatory Visit (INDEPENDENT_AMBULATORY_CARE_PROVIDER_SITE_OTHER): Payer: Medicare Other | Admitting: Urology

## 2018-03-31 ENCOUNTER — Other Ambulatory Visit (HOSPITAL_COMMUNITY): Payer: Self-pay | Admitting: Urology

## 2018-03-31 DIAGNOSIS — R972 Elevated prostate specific antigen [PSA]: Secondary | ICD-10-CM

## 2018-04-21 ENCOUNTER — Ambulatory Visit (HOSPITAL_COMMUNITY): Admission: RE | Admit: 2018-04-21 | Payer: Medicare Other | Source: Ambulatory Visit

## 2018-04-29 DIAGNOSIS — F419 Anxiety disorder, unspecified: Secondary | ICD-10-CM | POA: Diagnosis not present

## 2018-04-29 DIAGNOSIS — G4700 Insomnia, unspecified: Secondary | ICD-10-CM | POA: Diagnosis not present

## 2018-04-29 DIAGNOSIS — I10 Essential (primary) hypertension: Secondary | ICD-10-CM | POA: Diagnosis not present

## 2018-04-29 DIAGNOSIS — Z79899 Other long term (current) drug therapy: Secondary | ICD-10-CM | POA: Diagnosis not present

## 2018-05-13 DIAGNOSIS — Z23 Encounter for immunization: Secondary | ICD-10-CM | POA: Diagnosis not present

## 2018-05-13 DIAGNOSIS — F419 Anxiety disorder, unspecified: Secondary | ICD-10-CM | POA: Diagnosis not present

## 2018-05-13 DIAGNOSIS — E663 Overweight: Secondary | ICD-10-CM | POA: Diagnosis not present

## 2018-05-13 DIAGNOSIS — Z6826 Body mass index (BMI) 26.0-26.9, adult: Secondary | ICD-10-CM | POA: Diagnosis not present

## 2018-05-13 DIAGNOSIS — Z1389 Encounter for screening for other disorder: Secondary | ICD-10-CM | POA: Diagnosis not present

## 2018-06-02 ENCOUNTER — Ambulatory Visit (HOSPITAL_COMMUNITY): Payer: Medicare Other

## 2018-06-02 ENCOUNTER — Encounter (HOSPITAL_COMMUNITY): Payer: Self-pay

## 2018-06-04 DIAGNOSIS — D075 Carcinoma in situ of prostate: Secondary | ICD-10-CM | POA: Diagnosis not present

## 2018-06-04 DIAGNOSIS — R972 Elevated prostate specific antigen [PSA]: Secondary | ICD-10-CM | POA: Diagnosis not present

## 2018-06-04 DIAGNOSIS — C61 Malignant neoplasm of prostate: Secondary | ICD-10-CM | POA: Diagnosis not present

## 2018-06-11 DIAGNOSIS — C61 Malignant neoplasm of prostate: Secondary | ICD-10-CM | POA: Diagnosis not present

## 2018-06-16 ENCOUNTER — Other Ambulatory Visit (HOSPITAL_COMMUNITY): Payer: Self-pay | Admitting: Urology

## 2018-06-16 ENCOUNTER — Other Ambulatory Visit: Payer: Self-pay | Admitting: Urology

## 2018-06-16 DIAGNOSIS — C61 Malignant neoplasm of prostate: Secondary | ICD-10-CM

## 2018-06-23 ENCOUNTER — Encounter: Payer: Self-pay | Admitting: Radiation Oncology

## 2018-06-23 NOTE — Progress Notes (Addendum)
GU Location of Tumor / Histology: prostatic adenocarcinoma  If Prostate Cancer, Gleason Score is (4 + 3) and PSA is (11.7). Prostate volume: 30.4 g  Larry Mullins presented to Dr. Alyson Ingles as a referral from Dr. Redmond School for further evaluation of an elevated PSA of 11.7. There is no record of prior PSAs collected.  Biopsies of prostate (if applicable) revealed:    Past/Anticipated interventions by urology, if any: prostate biopsy, CT scan, bone scan, referral for consideration of radiotherapy  Past/Anticipated interventions by medical oncology, if any: no  Weight changes, if any: no  Bowel/Bladder complaints, if any: IPSS 0. SHIM 1. Reports hematuria at start of urination that resolves quickly. Denies dysuria. Denies urinary incontinence or leakage. Reports ED. Denies any bowel complaints.  Nausea/Vomiting, if any: no  Pain issues, if any:  Reports back and joint pain. Reports when taking a deep breath he experiences pain on both sides of his middle back. 06/24/2018 Bone scan revealed area of focal activity at the left L4&5 facet joint.  SAFETY ISSUES:  Prior radiation? no  Pacemaker/ICD? no  Possible current pregnancy? no, male patient  Is the patient on methotrexate? no  Current Complaints / other details:  68 year old male. Married. Retired. AX: Lopressor. Taking coumadin for mesenteric clots. Former smoker quit in 2014.

## 2018-06-24 ENCOUNTER — Other Ambulatory Visit: Payer: Self-pay

## 2018-06-24 ENCOUNTER — Encounter (HOSPITAL_COMMUNITY)
Admission: RE | Admit: 2018-06-24 | Discharge: 2018-06-24 | Disposition: A | Discharge status: Home / Self Care | Payer: Medicare Other | Source: Ambulatory Visit | Attending: Urology | Admitting: Urology

## 2018-06-24 ENCOUNTER — Ambulatory Visit (HOSPITAL_COMMUNITY)
Admission: RE | Admit: 2018-06-24 | Discharge: 2018-06-24 | Disposition: A | Discharge status: Home / Self Care | Payer: Medicare Other | Source: Ambulatory Visit | Attending: Urology | Admitting: Urology

## 2018-06-24 DIAGNOSIS — M549 Dorsalgia, unspecified: Secondary | ICD-10-CM | POA: Diagnosis not present

## 2018-06-24 DIAGNOSIS — C61 Malignant neoplasm of prostate: Secondary | ICD-10-CM | POA: Diagnosis not present

## 2018-06-24 MED ORDER — TECHNETIUM TC 99M MEDRONATE IV KIT
20.8000 | PACK | Freq: Once | INTRAVENOUS | Status: AC | PRN
  Administered 2018-06-24: 11:00:00 20.8 via INTRAVENOUS

## 2018-06-25 ENCOUNTER — Ambulatory Visit
Admission: RE | Admit: 2018-06-25 | Discharge: 2018-06-25 | Disposition: A | Discharge status: Home / Self Care | Payer: Medicare Other | Source: Ambulatory Visit | Attending: Radiation Oncology | Admitting: Radiation Oncology

## 2018-06-25 ENCOUNTER — Encounter: Payer: Self-pay | Admitting: Radiation Oncology

## 2018-06-25 ENCOUNTER — Other Ambulatory Visit: Payer: Self-pay

## 2018-06-25 VITALS — Ht 68.0 in | Wt 174.0 lb

## 2018-06-25 DIAGNOSIS — G3183 Dementia with Lewy bodies: Secondary | ICD-10-CM | POA: Diagnosis not present

## 2018-06-25 DIAGNOSIS — C61 Malignant neoplasm of prostate: Secondary | ICD-10-CM

## 2018-06-25 DIAGNOSIS — R972 Elevated prostate specific antigen [PSA]: Secondary | ICD-10-CM | POA: Diagnosis not present

## 2018-06-25 DIAGNOSIS — Z809 Family history of malignant neoplasm, unspecified: Secondary | ICD-10-CM | POA: Diagnosis not present

## 2018-06-25 HISTORY — DX: Malignant neoplasm of prostate: C61

## 2018-06-25 NOTE — Progress Notes (Signed)
See progress note under physician encounter. 

## 2018-06-25 NOTE — Progress Notes (Signed)
Radiation Oncology         [phone] ________________________________  Initial Outpatient Consultation - Conducted via telephone due to current COVID-19 concerns for limiting patient exposure  Name: Larry Mullins MRN: [id number]  Date: 06/25/2018  DOB: 19-Jun-1950  CC:Redmond School, MD  McKenzie, Candee Furbish, MD   REFERRING PHYSICIAN: Cleon Gustin, MD  DIAGNOSIS: 68 y.o. gentleman with Stage T1c adenocarcinoma of the prostate with Gleason score of 4+3 and PSA of 11.7     ICD-10-CM   1. Malignant neoplasm of prostate ([place]) C61     HISTORY OF PRESENT ILLNESS: Larry Mullins is a 68 y.o. male with a diagnosis of prostate cancer. He was noted to have an elevated PSA of 11.7 by his primary care physician, Dr. Redmond School.  The patient is unaware of any prior PSA labs.  Accordingly, he was referred for evaluation in urology by Dr. Alyson Ingles on 03/31/2018,  digital rectal examination was performed at that time revealing no abnormalities.  The patient proceeded to transrectal ultrasound with 12 biopsies of the prostate on 06/04/2018.  The prostate volume measured 30.4 cc.  Out of 12 core biopsies, 6 were positive and at least 1 with HG/PIN.  The maximum Gleason score was 4+3, and this was seen in right apex.  Additionally, Gleason 3+4 was seen in right apex lateral, right mid lateral, and right base lateral and Gleason 3+3 was seen in right mid and left mid lateral.    For disease staging, he underwent a bone scan on 06/24/2018, which demonstrated some nonspecific, focal increased uptake at the L4-5 facet but otherwise degenerative appearing changes only. He is scheduled to have a CT A/P at [facility] on 06/28/2018.   The patient reviewed the biopsy results with his urologist and he has kindly been referred today for discussion of potential radiation treatment options.   Of note, the patient's past medical history is significant for Lewy Body dementia, coronary artery disease  (prior CABG), HTN and mesenteric vein thrombosis.  He is on chronic daily Coumadin managed by his PCP, Dr. Gerarda Fraction.   PREVIOUS RADIATION THERAPY: No  PAST MEDICAL HISTORY:  Past Medical History:  Diagnosis Date  . Avascular necrosis of bone of hip, left ([place])   . CAD (coronary artery disease)    a. s/p CABG in 1999.  [name] Dementia ([place])   . Diverticulosis   . Hyperlipidemia   . Hypertension   . Prostate cancer ([place])   . Superior mesenteric vein thrombosis       PAST SURGICAL HISTORY: Past Surgical History:  Procedure Laterality Date  . BIOPSY  02/10/2017   Procedure: BIOPSY;  Surgeon: Danie Binder, MD;  Location: [facility] ENDO SUITE;  Service: Endoscopy;;  gastric biopsy   . COLONOSCOPY WITH PROPOFOL N/A 02/10/2017   Colonoscopy with normal TI, diverticulosis in rectosigmoid colon, sigmoid, descending, and ascending colon. Rectal bleeding likely due to inflamed hemorrhoids.   . 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 no stents per patient  . ESOPHAGOGASTRODUODENOSCOPY (EGD) WITH PROPOFOL N/A 02/10/2017   widely patent Schatzki's ring s/p dilation, H.pylori gastritis.  [name] HEMORRHOID SURGERY N/A 02/11/2017   Procedure: HEMORRHOIDECTOMY;  Surgeon: Aviva Signs, MD;  Location: [facility] ORS;  Service: General;  Laterality: N/A;    FAMILY HISTORY:  Family History  Problem Relation Age of Onset  . Colon polyps Sister        unsure age of surgery, had to have colon resection   . Cancer  Maternal Aunt        unknown type  . Cancer Maternal Aunt        unknown  . Colon cancer Neg Hx   . Liver disease Neg Hx     SOCIAL HISTORY: He is retired and lives with his wife in [place], [place]. Social History   Socioeconomic History  . Marital status: Married    Spouse name: Not on file  . Number of children: 4  . Years of education: Not on file  . Highest education level: Not on file  Occupational History    Comment: retired  Scientific laboratory technician  . Financial resource strain: Not on file   . Food insecurity:    Worry: Not on file    Inability: Not on file  . Transportation needs:    Medical: Not on file    Non-medical: Not on file  Tobacco Use  . Smoking status: Former Smoker    Packs/day: 0.50    Years: 20.00    Pack years: 10.00    Types: Cigarettes    Last attempt to quit: 01/28/2012    Years since quitting: 6.4  . Smokeless tobacco: Former Systems developer    Quit date: 1999  Substance and Sexual Activity  . 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 Sexual activity: Yes  Lifestyle  . Physical activity:    Days per week: Not on file    Minutes per session: Not on file  . Stress: Not on file  Relationships  . Social connections:    Talks on phone: Not on file    Gets together: Not on file    Attends religious service: Not on file    Active member of club or organization: Not on file    Attends meetings of clubs or organizations: Not on file    Relationship status: Not on file  . Intimate partner violence:    Fear of current or ex partner: Not on file    Emotionally abused: Not on file    Physically abused: Not on file    Forced sexual activity: Not on file  Other Topics Concern  . Not on file  Social History Narrative   Grandson (21) resides with them.    ALLERGIES: Lopressor [metoprolol tartrate]  MEDICATIONS:  Current Outpatient Medications  Medication Sig Dispense Refill  . aspirin EC 81 MG tablet Take 81 mg by mouth daily.    [name] atenolol (TENORMIN) 25 MG tablet Take by mouth daily.    [name] atorvastatin (LIPITOR) 10 MG tablet Take 10 mg by mouth daily.     . memantine (NAMENDA) 10 MG tablet Take 10 mg by mouth 2 (two) times daily.    . QUEtiapine (SEROQUEL) 200 MG tablet Take 200 mg by mouth at bedtime.    . traZODone (DESYREL) 100 MG tablet Take 100 mg by mouth at bedtime.    [name] warfarin (COUMADIN) 4 MG tablet Take 1  tablet by mouth daily at 12 noon.    [name] alprazolam (XANAX) 2 MG tablet Take 1 mg by mouth 4 (four) times daily.     [name] buPROPion (WELLBUTRIN) 100 MG tablet Take 0.5 tablets by mouth 2 (two) times daily.     [name] HYDROcodone-acetaminophen (NORCO) 10-325 MG tablet Take 1 tablet by mouth 4 (four) times daily  as needed for moderate pain.     . pantoprazole (PROTONIX) 40 MG tablet Take 1 tablet (40 mg total) by mouth 2 (two) times daily before a meal. (Patient not taking: Reported on 06/25/2018) 60 tablet 1  . polyethylene glycol (MIRALAX / GLYCOLAX) packet Take 17 g by mouth daily. (Patient not taking: Reported on 06/25/2018) 14 each 0  . psyllium (METAMUCIL) 58.6 % powder Take 1 packet by mouth daily.    . traMADol (ULTRAM) 50 MG tablet Take 1 tablet by mouth as needed for moderate pain.      No current facility-administered medications for this encounter.     REVIEW OF SYSTEMS:  On review of systems, the patient reports that he is doing well overall. He denies any chest pain, shortness of breath, cough, fevers, chills, night sweats, unintended weight changes. He denies any bowel disturbances, and denies abdominal pain, nausea or vomiting. He reports back and joint pain. His IPSS was 0, indicating no urinary symptoms. His SHIM was 1, indicating he has severe erectile dysfunction. A complete review of systems is obtained and is otherwise negative.     PHYSICAL EXAM:  Wt Readings from Last 3 Encounters:  06/25/18 174 lb (78.9 kg)  12/29/17 171 lb (77.6 kg)  08/13/17 170 lb 6.4 oz (77.3 kg)   Temp Readings from Last 3 Encounters:  12/29/17 (!) 97.1 F (36.2 C) (Oral)  08/13/17 (!) 97 F (36.1 C) (Oral)  04/30/17 (!) 97.5 F (36.4 C) (Oral)   BP Readings from Last 3 Encounters:  12/29/17 119/70  08/13/17 109/65  04/30/17 124/74   Pulse Readings from Last 3 Encounters:  12/29/17 77  08/13/17 66  04/30/17 65   Pain Assessment Pain Score: 0-No pain/10   KPS = 90   100 - Normal; no  complaints; no evidence of disease. 90   - Able to carry on normal activity; minor signs or symptoms of disease. 80   - Normal activity with effort; some signs or symptoms of disease. 10   - Cares for self; unable to carry on normal activity or to do active work. 60   - Requires occasional assistance, but is able to care for most of his personal needs. 50   - Requires considerable assistance and frequent medical care. 13   - Disabled; requires special care and assistance. 54   - Severely disabled; hospital admission is indicated although death not imminent. 68   - Very sick; hospital admission necessary; active supportive treatment necessary. 10   - Moribund; fatal processes progressing rapidly. 0     - Dead  Karnofsky DA, Abelmann Carlisle, Craver LS and Burchenal JH [phone]) The use of the nitrogen mustards in the palliative treatment of carcinoma: with particular reference to bronchogenic carcinoma Cancer 1 634-56  LABORATORY DATA:  Lab Results  Component Value Date   WBC 12.7 (H) 02/12/2017   HGB 13.7 02/12/2017   HCT 41.9 02/12/2017   MCV 95.9 02/12/2017   PLT 233 02/12/2017   Lab Results  Component Value Date   NA 144 02/08/2017   K 3.9 02/08/2017   CL 104 02/08/2017   CO2 29 02/08/2017   Lab Results  Component Value Date   ALT 26 02/06/2017   AST 31 02/06/2017   ALKPHOS 79 02/06/2017   BILITOT 0.4 02/06/2017     RADIOGRAPHY: Nm Bone Scan Whole Body  Result Date: 06/24/2018 CLINICAL DATA:  Back pain radiating through the chest for the last 7 years. Distant history of injury.  EXAM: NUCLEAR MEDICINE WHOLE BODY BONE SCAN TECHNIQUE: Whole body anterior and posterior images were obtained approximately 3 hours after intravenous injection of radiopharmaceutical. RADIOPHARMACEUTICALS:  20.8 mCi Technetium-35m MDP IV COMPARISON:  Previous radiography and CT studies. FINDINGS: No evidence of abnormal spinal activity in the thoracic region. Mild increased activity of the left L4-5 facet.  No abnormal rib activity. Shoulders and elbows appear negative. There appears to be some arthritic type activity in the left wrist region. There is arthritic type activity of the left hip. There are arthritic changes of both knees. IMPRESSION: No evidence of thoracic spine region abnormality or significant rib finding. Ordinary arthritic-type activity at the left wrist, left hip and both knees. Probable focal activity at the left L4-5 facet joint. Electronically Signed   By: Nelson Chimes M.D.   On: 06/24/2018 16:53      IMPRESSION/PLAN:  This visit was conducted via telephone to spare the patient unnecessary potential exposure in the healthcare setting during the current COVID-19 pandemic.  1. 68 y.o. gentleman with Stage T1c adenocarcinoma of the prostate with Gleason Score of 4+3, and PSA of 11.7.   We discussed the patient's workup and outlined the nature of prostate cancer in this setting. The patient's T stage, Gleason's score, and PSA put him into the unfavorable intermediate risk group. Accordingly, he is eligible for a variety of potential treatment options including brachytherapy, 5.5 weeks of external radiation or prostatectomy. We discussed the available radiation techniques, and focused on the details and logistics and delivery. We discussed and outlined the risks, benefits, short and long-term effects associated with radiotherapy and compared and contrasted these with prostatectomy. We discussed the role of SpaceOAR in reducing the rectal toxicity associated with radiotherapy.    At the end of the conversation the patient is interested in moving forward with brachytherapy and use of SpaceOAR to reduce rectal toxicity from radiotherapy.  He will proceed as scheduled with CT A/P on 06/28/2018 at [facility] urology and pending there are no unexpected findings, we will share our discussion with Dr. Alyson Ingles and move forward with scheduling his CT [facility] planning appointment in the near future. He will need  medical clearance from Dr. Gerarda Fraction (PCP manages his Coumadin) to allow stopping Coumadin 5 days prior to seed implant procedure and likely will need to bridge with Lovenox injections while off Coumadin which is what he did recently prior to his prostate biopsy and he did well. The patient will be contacted by Romie Jumper in our office, who will be working closely with him to coordinate OR scheduling and pre and post procedure appointments, in the near future.  We will contact the pharmaceutical rep to ensure that Herminie is available at the time of procedure.  He will have a prostate MRI following his post-seed CT SIM to confirm appropriate distribution of the [place].   Given current concerns for patient exposure during the COVID-19 pandemic, this encounter was conducted via telephone. The patient was notified in advance and was offered a [place] meeting to allow for face to face communication but unfortunately reported that he did not have the appropriate resources/technology to support such a visit and instead preferred to proceed with telephone consult. The patient has given verbal consent for this type of encounter. The time spent during this encounter was 60 minutes. The attendants for this meeting include Tyler Pita MD, Ashlyn Bruning PA-C, [place], patient, Noah Lembke and wife, Olegario Shearer. During the encounter, Tyler Pita MD, Ashlyn Bruning PA-C, and scribe,  Wilburn Mylar were located at [facility] Radiation Oncology Department.  Patient, Stephanie Mcglone and wife, Olegario Shearer were located at home.     Nicholos Johns, PA-C    Tyler Pita, MD  [facility] Direct Dial: [phone]  Fax: [phone] .com  Skype  LinkedIn   This document serves as a record of services personally performed by Tyler Pita, MD and Freeman Caldron, PA-C. It was created on their behalf by Wilburn Mylar, a trained  medical scribe. The creation of this record is based on the scribe's personal observations and the provider's statements to them. This document has been checked and approved by the attending provider.

## 2018-06-28 ENCOUNTER — Telehealth: Payer: Self-pay | Admitting: Medical Oncology

## 2018-06-28 DIAGNOSIS — C61 Malignant neoplasm of prostate: Secondary | ICD-10-CM | POA: Diagnosis not present

## 2018-06-28 NOTE — Telephone Encounter (Signed)
Called patient to introduced myself as the prostate nurse navigator and discuss  my role.I was unable to meet him during his consult 5/29 with Dr. Tammi Klippel.  I spoke with his wife and she states the visit went well. He has chosen brachytherapy as treatment. She informed me of his past medical issues including being on blood thinners which he will need bridging  for his surgery. They are aware that Enid Derry will be in contact with him to schedule the surgery. I informed her that I am working remotely and asked that she call my office and leave me questions or concerns and I will return her call.

## 2018-06-29 ENCOUNTER — Telehealth: Payer: Self-pay | Admitting: *Deleted

## 2018-06-29 NOTE — Telephone Encounter (Signed)
CALLED PATIENT'S WIFE- Larry Mullins TO UPDATE, LVM FOR A RETURN CALL

## 2018-07-02 ENCOUNTER — Other Ambulatory Visit: Payer: Self-pay | Admitting: Urology

## 2018-07-06 ENCOUNTER — Telehealth: Payer: Self-pay | Admitting: *Deleted

## 2018-07-06 NOTE — Telephone Encounter (Signed)
CALLED PATIENT TO INFORM OF PRE-SEED APPTS. AND IMPLANT, LVM FOR A RETURN CALL 

## 2018-07-07 ENCOUNTER — Telehealth: Payer: Self-pay | Admitting: *Deleted

## 2018-07-07 NOTE — Telephone Encounter (Signed)
Returned patient's phone call, lvm for a return call 

## 2018-07-09 ENCOUNTER — Other Ambulatory Visit: Payer: Self-pay | Admitting: Urology

## 2018-07-09 ENCOUNTER — Telehealth: Payer: Self-pay | Admitting: *Deleted

## 2018-07-09 DIAGNOSIS — C61 Malignant neoplasm of prostate: Secondary | ICD-10-CM

## 2018-07-09 NOTE — Telephone Encounter (Signed)
CALLED PATIENT'S WIFE- VICKIE TO INFORM OF PRE-SEED APPTS. FOR 08-26-18 AND HIS IMPLANT FOR 09-13-18, SPOKE WITH PATIENT'S WIFE- VICKIE AND SHE IS AWARE OF THESE APPTS.

## 2018-08-10 DIAGNOSIS — Z23 Encounter for immunization: Secondary | ICD-10-CM | POA: Diagnosis not present

## 2018-08-10 DIAGNOSIS — M1990 Unspecified osteoarthritis, unspecified site: Secondary | ICD-10-CM | POA: Diagnosis not present

## 2018-08-10 DIAGNOSIS — R300 Dysuria: Secondary | ICD-10-CM | POA: Diagnosis not present

## 2018-08-10 DIAGNOSIS — Z6827 Body mass index (BMI) 27.0-27.9, adult: Secondary | ICD-10-CM | POA: Diagnosis not present

## 2018-08-10 DIAGNOSIS — K219 Gastro-esophageal reflux disease without esophagitis: Secondary | ICD-10-CM | POA: Diagnosis not present

## 2018-08-12 DIAGNOSIS — C61 Malignant neoplasm of prostate: Secondary | ICD-10-CM | POA: Diagnosis not present

## 2018-08-26 ENCOUNTER — Ambulatory Visit (HOSPITAL_COMMUNITY)
Admission: RE | Admit: 2018-08-26 | Discharge: 2018-08-26 | Disposition: A | Discharge status: Home / Self Care | Payer: Medicare Other | Source: Ambulatory Visit | Attending: Urology | Admitting: Urology

## 2018-08-26 ENCOUNTER — Other Ambulatory Visit: Payer: Self-pay

## 2018-08-26 ENCOUNTER — Ambulatory Visit: Payer: Medicare Other | Admitting: Radiation Oncology

## 2018-08-26 ENCOUNTER — Ambulatory Visit
Admission: RE | Admit: 2018-08-26 | Discharge: 2018-08-26 | Disposition: A | Discharge status: Home / Self Care | Payer: Medicare Other | Source: Ambulatory Visit | Attending: Radiation Oncology | Admitting: Radiation Oncology

## 2018-08-26 ENCOUNTER — Ambulatory Visit
Admission: RE | Admit: 2018-08-26 | Discharge: 2018-08-26 | Disposition: A | Discharge status: Home / Self Care | Payer: Medicare Other | Source: Ambulatory Visit | Attending: Urology | Admitting: Urology

## 2018-08-26 ENCOUNTER — Encounter (HOSPITAL_COMMUNITY)
Admission: RE | Admit: 2018-08-26 | Discharge: 2018-08-26 | Disposition: A | Discharge status: Home / Self Care | Payer: Medicare Other | Source: Ambulatory Visit | Attending: Urology | Admitting: Urology

## 2018-08-26 DIAGNOSIS — Z87891 Personal history of nicotine dependence: Secondary | ICD-10-CM | POA: Diagnosis not present

## 2018-08-26 DIAGNOSIS — C61 Malignant neoplasm of prostate: Secondary | ICD-10-CM

## 2018-08-26 DIAGNOSIS — Z01818 Encounter for other preprocedural examination: Secondary | ICD-10-CM | POA: Diagnosis not present

## 2018-08-26 DIAGNOSIS — Z8774 Personal history of (corrected) congenital malformations of heart and circulatory system: Secondary | ICD-10-CM | POA: Diagnosis not present

## 2018-08-26 NOTE — Progress Notes (Signed)
  Radiation Oncology         [phone] ________________________________  Name: Larry Mullins MRN: [id number]  Date: 08/26/2018  DOB: 08-Oct-1950  SIMULATION AND TREATMENT PLANNING NOTE PUBIC ARCH STUDY  ZM:CEYEM, Purcell Nails, MD  Redmond School, MD  DIAGNOSIS: 68 y.o. gentleman with Stage T1c adenocarcinoma of the prostate with Gleason score of 4+3 and PSA of 11.7      ICD-10-CM   1. Malignant neoplasm of prostate ([place])  C61     COMPLEX SIMULATION:  The patient presented today for evaluation for possible prostate seed implant. He was brought to the radiation planning suite and placed supine on the CT couch. A 3-dimensional image study set was obtained in upload to the planning computer. There, on each axial slice, I contoured the prostate gland. Then, using three-dimensional radiation planning tools I reconstructed the prostate in view of the structures from the transperineal needle pathway to assess for possible pubic arch interference. In doing so, I did not appreciate any pubic arch interference. Also, the patient's prostate volume was estimated based on the drawn structure. The volume was 30.5 cc.  Given the pubic arch appearance and prostate volume, patient remains a good candidate to proceed with prostate seed implant. Today, he freely provided informed written consent to proceed.    PLAN: The patient will undergo prostate seed implant.   ________________________________  Sheral Apley. Tammi Klippel, M.D.

## 2018-09-08 ENCOUNTER — Telehealth: Payer: Self-pay | Admitting: *Deleted

## 2018-09-08 NOTE — Telephone Encounter (Signed)
Called patient to remind of labs COVID - 19 testing for 09-09-18, spoke with patient's wife- Olegario Shearer and she is aware of these appts.

## 2018-09-09 ENCOUNTER — Other Ambulatory Visit: Payer: Self-pay

## 2018-09-09 ENCOUNTER — Other Ambulatory Visit (HOSPITAL_COMMUNITY)
Admission: RE | Admit: 2018-09-09 | Discharge: 2018-09-09 | Disposition: A | Discharge status: Home / Self Care | Payer: Medicare Other | Source: Ambulatory Visit | Attending: Urology | Admitting: Urology

## 2018-09-09 ENCOUNTER — Encounter (HOSPITAL_COMMUNITY)
Admission: RE | Admit: 2018-09-09 | Discharge: 2018-09-09 | Disposition: A | Discharge status: Home / Self Care | Payer: Medicare Other | Source: Ambulatory Visit | Attending: Urology | Admitting: Urology

## 2018-09-09 DIAGNOSIS — M109 Gout, unspecified: Secondary | ICD-10-CM | POA: Diagnosis not present

## 2018-09-09 DIAGNOSIS — Z7901 Long term (current) use of anticoagulants: Secondary | ICD-10-CM | POA: Diagnosis not present

## 2018-09-09 DIAGNOSIS — M1990 Unspecified osteoarthritis, unspecified site: Secondary | ICD-10-CM | POA: Diagnosis not present

## 2018-09-09 DIAGNOSIS — Z20828 Contact with and (suspected) exposure to other viral communicable diseases: Secondary | ICD-10-CM | POA: Diagnosis not present

## 2018-09-09 DIAGNOSIS — G894 Chronic pain syndrome: Secondary | ICD-10-CM | POA: Diagnosis not present

## 2018-09-09 DIAGNOSIS — M25559 Pain in unspecified hip: Secondary | ICD-10-CM | POA: Diagnosis not present

## 2018-09-09 DIAGNOSIS — Z8719 Personal history of other diseases of the digestive system: Secondary | ICD-10-CM | POA: Diagnosis not present

## 2018-09-09 DIAGNOSIS — Z8619 Personal history of other infectious and parasitic diseases: Secondary | ICD-10-CM | POA: Diagnosis not present

## 2018-09-09 DIAGNOSIS — Z79899 Other long term (current) drug therapy: Secondary | ICD-10-CM | POA: Diagnosis not present

## 2018-09-09 DIAGNOSIS — Z8371 Family history of colonic polyps: Secondary | ICD-10-CM | POA: Diagnosis not present

## 2018-09-09 DIAGNOSIS — K5790 Diverticulosis of intestine, part unspecified, without perforation or abscess without bleeding: Secondary | ICD-10-CM | POA: Diagnosis not present

## 2018-09-09 DIAGNOSIS — G319 Degenerative disease of nervous system, unspecified: Secondary | ICD-10-CM | POA: Diagnosis not present

## 2018-09-09 DIAGNOSIS — C61 Malignant neoplasm of prostate: Secondary | ICD-10-CM | POA: Diagnosis not present

## 2018-09-09 DIAGNOSIS — M879 Osteonecrosis, unspecified: Secondary | ICD-10-CM | POA: Diagnosis not present

## 2018-09-09 DIAGNOSIS — I2510 Atherosclerotic heart disease of native coronary artery without angina pectoris: Secondary | ICD-10-CM | POA: Diagnosis not present

## 2018-09-09 DIAGNOSIS — Z01812 Encounter for preprocedural laboratory examination: Secondary | ICD-10-CM | POA: Insufficient documentation

## 2018-09-09 DIAGNOSIS — Z951 Presence of aortocoronary bypass graft: Secondary | ICD-10-CM | POA: Diagnosis not present

## 2018-09-09 DIAGNOSIS — F1011 Alcohol abuse, in remission: Secondary | ICD-10-CM | POA: Diagnosis not present

## 2018-09-09 DIAGNOSIS — E785 Hyperlipidemia, unspecified: Secondary | ICD-10-CM | POA: Diagnosis not present

## 2018-09-09 DIAGNOSIS — F0391 Unspecified dementia with behavioral disturbance: Secondary | ICD-10-CM | POA: Diagnosis not present

## 2018-09-09 DIAGNOSIS — Z87891 Personal history of nicotine dependence: Secondary | ICD-10-CM | POA: Diagnosis not present

## 2018-09-09 DIAGNOSIS — Z86718 Personal history of other venous thrombosis and embolism: Secondary | ICD-10-CM | POA: Diagnosis not present

## 2018-09-09 DIAGNOSIS — K625 Hemorrhage of anus and rectum: Secondary | ICD-10-CM | POA: Diagnosis not present

## 2018-09-09 DIAGNOSIS — Z809 Family history of malignant neoplasm, unspecified: Secondary | ICD-10-CM | POA: Diagnosis not present

## 2018-09-09 DIAGNOSIS — I10 Essential (primary) hypertension: Secondary | ICD-10-CM | POA: Diagnosis not present

## 2018-09-09 LAB — COMPREHENSIVE METABOLIC PANEL
ALT: 23 U/L (ref 0–44)
AST: 31 U/L (ref 15–41)
Albumin: 3.9 g/dL (ref 3.5–5.0)
Alkaline Phosphatase: 74 U/L (ref 38–126)
Anion gap: 9 (ref 5–15)
BUN: 18 mg/dL (ref 8–23)
CO2: 28 mmol/L (ref 22–32)
Calcium: 8.7 mg/dL — ABNORMAL LOW (ref 8.9–10.3)
Chloride: 106 mmol/L (ref 98–111)
Creatinine, Ser: 1.06 mg/dL (ref 0.61–1.24)
GFR calc Af Amer: >60 mL/min (ref >60)
GFR calc non Af Amer: >60 mL/min (ref >60)
Glucose, Bld: 90 mg/dL (ref 70–99)
Potassium: 4.6 mmol/L (ref 3.5–5.1)
Sodium: 143 mmol/L (ref 135–145)
Total Bilirubin: 0.4 mg/dL (ref 0.3–1.2)
Total Protein: 7 g/dL (ref 6.5–8.1)

## 2018-09-09 LAB — CBC
HCT: 41.6 % (ref 39.0–52.0)
Hemoglobin: 12.9 g/dL — ABNORMAL LOW (ref 13.0–17.0)
MCH: 30.4 pg (ref 26.0–34.0)
MCHC: 31 g/dL (ref 30.0–36.0)
MCV: 97.9 fL (ref 80.0–100.0)
Platelets: 187 10*3/uL (ref 150–400)
RBC: 4.25 MIL/uL (ref 4.22–5.81)
RDW: 13.3 % (ref 11.5–15.5)
WBC: 4.9 10*3/uL (ref 4.0–10.5)
nRBC: 0 % (ref 0.0–0.2)

## 2018-09-09 LAB — APTT: aPTT: 35 seconds (ref 24–36)

## 2018-09-09 LAB — PROTIME-INR
INR: 1.7 — ABNORMAL HIGH (ref 0.8–1.2)
Prothrombin Time: 19.8 seconds — ABNORMAL HIGH (ref 11.4–15.2)

## 2018-09-09 LAB — SARS CORONAVIRUS 2 (TAT 6-24 HRS): SARS Coronavirus 2: NEGATIVE

## 2018-09-10 ENCOUNTER — Encounter (HOSPITAL_BASED_OUTPATIENT_CLINIC_OR_DEPARTMENT_OTHER): Payer: Self-pay | Admitting: *Deleted

## 2018-09-10 ENCOUNTER — Telehealth: Payer: Self-pay | Admitting: *Deleted

## 2018-09-10 ENCOUNTER — Encounter: Payer: Self-pay | Admitting: *Deleted

## 2018-09-10 NOTE — Telephone Encounter (Signed)
Called patient to remind of procedure for 09-13-18, spoke with patient's wife- Loletha Carrow and she is aware of this procedure

## 2018-09-10 NOTE — Progress Notes (Addendum)
Anesthesia Chart Review   Case: [id number] Date/Time: 09/13/18 1245   Procedures:      RADIOACTIVE SEED IMPLANT/BRACHYTHERAPY IMPLANT (N/A )     SPACE OAR INSTILLATION (N/A )   Anesthesia type: Choice   Pre-op diagnosis: PROSTATE CANCER   Location: [facility] OR ROOM 1 / [place]   Surgeon: Cleon Gustin, MD      DISCUSSION:68 y.o. former smoker (10 pack years, quit 01/28/12) with h/o HLD, HTN, CAD (CABG x4 1999), on Warfarin due to small mesenteric vein thrombosis, prostate cancer scheduled for above procedure 09/13/2018 with Dr. Nicolette Bang.   Coumadin managed by PCP, clearance on chart.  Instructions given for Coumadin.  Wife reports last dose 09/07/2018.    Discussed with Dr. Marcie Bal.  Anticipate pt can proceed with planned procedure barring acute status change and after evaluation DOS. VS: There were no vitals taken for this visit.  PROVIDERS: Redmond School, MD is PCP   Kate Sable, [facility] is Cardiologist  LABS: SDW (all labs ordered are listed, but only abnormal results are displayed)  Labs Reviewed - No data to display   IMAGES: Chest Xray 08/26/2018 FINDINGS: Status post median sternotomy. Both lungs are clear. The visualized skeletal structures are unremarkable.  IMPRESSION: No acute abnormality of the lungs.  EKG: 08/26/2018 Rate 58 bpm Sinus bradycardia Minimal voltage criteria for LVH, may be normal variant Inferior infarct , age undetermined Abnormal ECG  CV: Echo 02/09/17 Study Conclusions  - Left ventricle: The cavity size was normal. Wall thickness was   increased in a pattern of mild LVH. Systolic function was normal.   The estimated ejection fraction was in the range of 55% to 60%.   Indeterminate diastolic function. - Regional wall motion abnormality: Mild hypokinesis of the basal   inferior and basal inferolateral myocardium. - Aortic valve: There was trivial regurgitation. Past Medical History:  Diagnosis Date  .  Alcohol abuse, in remission    since 2012  . Anticoagulated on Coumadin    managed by dr Gerarda Fraction  . Avascular necrosis of bone of hip, left ([place])   . CAD (coronary artery disease) cardiologist-  dr Natividad Brood in epic 03-09-2017, pt did want nuclear stress test ;  currently being followed by pcp   a. s/p CABG in 1999.  [name] Cerebral atrophy ([place])   . Chronic hip pain   . Chronic pain syndrome   . Dementia with behavioral disturbance [facility])    neurologist--  dr Charise Carwin [facility] in [place])--- per wife pt can anger quickly by is not aggressive  . Diverticulosis of colon   . History of gout yrs ago  . History of Helicobacter pylori infection    01/ 2019 and treated by dr Gerarda Fraction  . 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 Hyperlipidemia   . Hypertension   . Impairment of short-term and long-term memory    due to dementia  . OA (osteoarthritis)   . Prostate cancer [facility]) urologist-  dr Alyson Ingles  oncologist-- dr Tammi Klippel   dx 06-04-2018-- Stage T1c, Gleason 4+3  . S/P CABG x 4 1999  . Schatzki's ring    s/p dilatation  . Wears glasses     Past Surgical History:  Procedure Laterality Date  . BIOPSY  02/10/2017   Procedure: BIOPSY;  Surgeon: Danie Binder, MD;  Location: [facility]  ENDO SUITE;  Service: Endoscopy;;  gastric biopsy   . COLONOSCOPY WITH PROPOFOL N/A 02/10/2017   Colonoscopy with normal TI, diverticulosis in rectosigmoid colon, sigmoid, descending, and ascending colon. Rectal bleeding likely due to inflamed hemorrhoids.   . CORONARY ARTERY BYPASS GRAFT  06/29/1997   @[facility]    4 vessel. no stents per patient  . ESOPHAGOGASTRODUODENOSCOPY (EGD) WITH PROPOFOL N/A 02/10/2017   widely patent Schatzki's ring s/p dilation, H.pylori gastritis.  [name] HEMORRHOID SURGERY N/A 02/11/2017   Procedure: HEMORRHOIDECTOMY;  Surgeon: Aviva Signs, MD;   Location: [facility] ORS;  Service: General;  Laterality: N/A;    MEDICATIONS: . alprazolam Duanne Moron) 2 MG tablet  . aspirin EC 81 MG tablet  . atenolol (TENORMIN) 25 MG tablet  . atorvastatin (LIPITOR) 10 MG tablet  . buPROPion (WELLBUTRIN) 100 MG tablet  . memantine (NAMENDA) 10 MG tablet  . polyethylene glycol (MIRALAX / GLYCOLAX) packet  . QUEtiapine (SEROQUEL) 200 MG tablet  . traZODone (DESYREL) 100 MG tablet  . warfarin (COUMADIN) 4 MG tablet   No current facility-administered medications for this encounter.       Maia Plan [facility] Pre-Surgical Testing [phone] 09/10/18  5:07 PM

## 2018-09-10 NOTE — Progress Notes (Addendum)
Spoke w/ pt wife, vicky, via phone for pre-op interview.  Npo after mn w/ exception water until 0700 then nothing by mouth, wife verbalized understanding.  Arrive at 1100.  Current lab results 09-09-2018 (cbc, cmp, pt/int, ptt) and covid test done yesterday.  Current ekg/cxr in epic and chart.  Will take wellbutrin and namenda am dos w/ sips of water.  Pt has dementia per wife he can permit but he will not know about his medication , need to talk to wife about meds.   Pt takes coumadin and asa 81 mg, managed by dr Gerarda Fraction. Per wife was given instructions to stop plavix/ asa by dr Alyson Ingles office.  Last dose for both 09-07-2018 per wife.  Called and lvm for selita, or scheduler for dr Alyson Ingles, requested clearance to be faxed.  Received and placed in chart , dated 07-29-2018.  Per pt wife pt denies cardiac / stroke s &s.  Chart to be reviewed by anethesia, Konrad Felix PA.  ADDENDUM:  Chart reviewed w/ anesthesia, Konrad Felix PA,  Ok to proceed.

## 2018-09-13 ENCOUNTER — Encounter (HOSPITAL_BASED_OUTPATIENT_CLINIC_OR_DEPARTMENT_OTHER): Payer: Self-pay

## 2018-09-13 ENCOUNTER — Ambulatory Visit (HOSPITAL_BASED_OUTPATIENT_CLINIC_OR_DEPARTMENT_OTHER): Payer: Medicare Other | Admitting: Anesthesiology

## 2018-09-13 ENCOUNTER — Other Ambulatory Visit: Payer: Self-pay

## 2018-09-13 ENCOUNTER — Ambulatory Visit (HOSPITAL_BASED_OUTPATIENT_CLINIC_OR_DEPARTMENT_OTHER): Payer: Medicare Other | Admitting: Physician Assistant

## 2018-09-13 ENCOUNTER — Encounter (HOSPITAL_BASED_OUTPATIENT_CLINIC_OR_DEPARTMENT_OTHER)
Admission: RE | Disposition: A | Discharge status: Home / Self Care | Payer: Self-pay | Source: Ambulatory Visit | Attending: Urology

## 2018-09-13 ENCOUNTER — Ambulatory Visit (HOSPITAL_COMMUNITY): Payer: Medicare Other

## 2018-09-13 ENCOUNTER — Ambulatory Visit (HOSPITAL_BASED_OUTPATIENT_CLINIC_OR_DEPARTMENT_OTHER)
Admission: RE | Admit: 2018-09-13 | Discharge: 2018-09-13 | Disposition: A | Discharge status: Home / Self Care | Payer: Medicare Other | Source: Ambulatory Visit | Attending: Urology | Admitting: Urology

## 2018-09-13 DIAGNOSIS — K219 Gastro-esophageal reflux disease without esophagitis: Secondary | ICD-10-CM | POA: Diagnosis not present

## 2018-09-13 DIAGNOSIS — I2510 Atherosclerotic heart disease of native coronary artery without angina pectoris: Secondary | ICD-10-CM | POA: Insufficient documentation

## 2018-09-13 DIAGNOSIS — I10 Essential (primary) hypertension: Secondary | ICD-10-CM | POA: Insufficient documentation

## 2018-09-13 DIAGNOSIS — Z87891 Personal history of nicotine dependence: Secondary | ICD-10-CM | POA: Insufficient documentation

## 2018-09-13 DIAGNOSIS — M109 Gout, unspecified: Secondary | ICD-10-CM | POA: Diagnosis not present

## 2018-09-13 DIAGNOSIS — M25559 Pain in unspecified hip: Secondary | ICD-10-CM | POA: Insufficient documentation

## 2018-09-13 DIAGNOSIS — K625 Hemorrhage of anus and rectum: Secondary | ICD-10-CM | POA: Insufficient documentation

## 2018-09-13 DIAGNOSIS — K5790 Diverticulosis of intestine, part unspecified, without perforation or abscess without bleeding: Secondary | ICD-10-CM | POA: Diagnosis not present

## 2018-09-13 DIAGNOSIS — Z01818 Encounter for other preprocedural examination: Secondary | ICD-10-CM

## 2018-09-13 DIAGNOSIS — M879 Osteonecrosis, unspecified: Secondary | ICD-10-CM | POA: Insufficient documentation

## 2018-09-13 DIAGNOSIS — G894 Chronic pain syndrome: Secondary | ICD-10-CM | POA: Insufficient documentation

## 2018-09-13 DIAGNOSIS — F1011 Alcohol abuse, in remission: Secondary | ICD-10-CM | POA: Insufficient documentation

## 2018-09-13 DIAGNOSIS — G319 Degenerative disease of nervous system, unspecified: Secondary | ICD-10-CM | POA: Insufficient documentation

## 2018-09-13 DIAGNOSIS — Z8619 Personal history of other infectious and parasitic diseases: Secondary | ICD-10-CM | POA: Insufficient documentation

## 2018-09-13 DIAGNOSIS — Z86718 Personal history of other venous thrombosis and embolism: Secondary | ICD-10-CM | POA: Insufficient documentation

## 2018-09-13 DIAGNOSIS — Z951 Presence of aortocoronary bypass graft: Secondary | ICD-10-CM | POA: Diagnosis not present

## 2018-09-13 DIAGNOSIS — C61 Malignant neoplasm of prostate: Secondary | ICD-10-CM | POA: Diagnosis not present

## 2018-09-13 DIAGNOSIS — F0391 Unspecified dementia with behavioral disturbance: Secondary | ICD-10-CM | POA: Insufficient documentation

## 2018-09-13 DIAGNOSIS — Z8719 Personal history of other diseases of the digestive system: Secondary | ICD-10-CM | POA: Insufficient documentation

## 2018-09-13 DIAGNOSIS — Z7901 Long term (current) use of anticoagulants: Secondary | ICD-10-CM | POA: Insufficient documentation

## 2018-09-13 DIAGNOSIS — Z79899 Other long term (current) drug therapy: Secondary | ICD-10-CM | POA: Insufficient documentation

## 2018-09-13 DIAGNOSIS — E785 Hyperlipidemia, unspecified: Secondary | ICD-10-CM | POA: Insufficient documentation

## 2018-09-13 DIAGNOSIS — Z8371 Family history of colonic polyps: Secondary | ICD-10-CM | POA: Insufficient documentation

## 2018-09-13 DIAGNOSIS — M1990 Unspecified osteoarthritis, unspecified site: Secondary | ICD-10-CM | POA: Insufficient documentation

## 2018-09-13 DIAGNOSIS — Z809 Family history of malignant neoplasm, unspecified: Secondary | ICD-10-CM | POA: Insufficient documentation

## 2018-09-13 HISTORY — DX: Presence of spectacles and contact lenses: Z97.3

## 2018-09-13 HISTORY — PX: SPACE OAR INSTILLATION: SHX6769

## 2018-09-13 HISTORY — DX: Personal history of other venous thrombosis and embolism: Z86.718

## 2018-09-13 HISTORY — DX: Personal history of other diseases of the musculoskeletal system and connective tissue: Z87.39

## 2018-09-13 HISTORY — DX: Esophageal obstruction: K22.2

## 2018-09-13 HISTORY — DX: Personal history of other infectious and parasitic diseases: Z86.19

## 2018-09-13 HISTORY — DX: Diverticulosis of large intestine without perforation or abscess without bleeding: K57.30

## 2018-09-13 HISTORY — DX: Unspecified dementia, unspecified severity, with other behavioral disturbance: F03.918

## 2018-09-13 HISTORY — DX: Chronic pain syndrome: G89.4

## 2018-09-13 HISTORY — DX: Alcohol abuse, in remission: F10.11

## 2018-09-13 HISTORY — PX: RADIOACTIVE SEED IMPLANT: SHX5150

## 2018-09-13 HISTORY — DX: Degenerative disease of nervous system, unspecified: G31.9

## 2018-09-13 HISTORY — DX: Other chronic pain: G89.29

## 2018-09-13 HISTORY — DX: Unspecified osteoarthritis, unspecified site: M19.90

## 2018-09-13 HISTORY — DX: Other amnesia: R41.3

## 2018-09-13 HISTORY — DX: Long term (current) use of anticoagulants: Z79.01

## 2018-09-13 HISTORY — DX: Unspecified dementia with behavioral disturbance: F03.91

## 2018-09-13 LAB — PROTIME-INR
INR: 1 (ref 0.8–1.2)
Prothrombin Time: 12.6 seconds (ref 11.4–15.2)

## 2018-09-13 SURGERY — INSERTION, RADIATION SOURCE, PROSTATE
Anesthesia: General | Site: Prostate

## 2018-09-13 MED ORDER — ONDANSETRON HCL 4 MG/2ML IJ SOLN
INTRAMUSCULAR | Status: AC
  Filled 2018-09-13: qty 2

## 2018-09-13 MED ORDER — EPHEDRINE 5 MG/ML INJ
INTRAVENOUS | Status: AC
  Filled 2018-09-13: qty 10

## 2018-09-13 MED ORDER — ONDANSETRON HCL 4 MG/2ML IJ SOLN
INTRAMUSCULAR | Status: DC | PRN
  Administered 2018-09-13: 4 mg via INTRAVENOUS

## 2018-09-13 MED ORDER — SODIUM CHLORIDE FLUSH 0.9 % IV SOLN
INTRAVENOUS | Status: DC | PRN
  Administered 2018-09-13: 10 mL

## 2018-09-13 MED ORDER — FENTANYL CITRATE (PF) 100 MCG/2ML IJ SOLN
INTRAMUSCULAR | Status: DC | PRN
  Administered 2018-09-13 (×2): 50 ug via INTRAVENOUS

## 2018-09-13 MED ORDER — LACTATED RINGERS IV SOLN
INTRAVENOUS | Status: DC
  Administered 2018-09-13: 15:00:00 via INTRAVENOUS
  Filled 2018-09-13: qty 1000

## 2018-09-13 MED ORDER — MEPERIDINE HCL 25 MG/ML IJ SOLN
6.2500 mg | INTRAMUSCULAR | Status: DC | PRN
  Filled 2018-09-13: qty 1

## 2018-09-13 MED ORDER — SODIUM CHLORIDE (PF) 0.9 % IJ SOLN
INTRAMUSCULAR | Status: DC | PRN
  Administered 2018-09-13: 7 mL

## 2018-09-13 MED ORDER — TRAMADOL HCL 50 MG PO TABS: 50.0000 mg | ORAL_TABLET | Freq: Four times a day (QID) | ORAL | 0 refills | Status: AC | PRN

## 2018-09-13 MED ORDER — LIDOCAINE 2% (20 MG/ML) 5 ML SYRINGE
INTRAMUSCULAR | Status: AC
  Filled 2018-09-13: qty 5

## 2018-09-13 MED ORDER — CEFAZOLIN SODIUM-DEXTROSE 2-4 GM/100ML-% IV SOLN
INTRAVENOUS | Status: AC
  Filled 2018-09-13: qty 100

## 2018-09-13 MED ORDER — EPHEDRINE SULFATE-NACL 50-0.9 MG/10ML-% IV SOSY
PREFILLED_SYRINGE | INTRAVENOUS | Status: DC | PRN
  Administered 2018-09-13 (×2): 10 mg via INTRAVENOUS

## 2018-09-13 MED ORDER — FLEET ENEMA 7-19 GM/118ML RE ENEM
1.0000 | ENEMA | Freq: Once | RECTAL | Status: DC
  Filled 2018-09-13: qty 1

## 2018-09-13 MED ORDER — IOHEXOL 300 MG/ML  SOLN
INTRAMUSCULAR | Status: DC | PRN
  Administered 2018-09-13: 13:00:00 3 mL

## 2018-09-13 MED ORDER — LACTATED RINGERS IV SOLN
INTRAVENOUS | Status: DC
  Administered 2018-09-13: 12:00:00 1000 mL via INTRAVENOUS
  Filled 2018-09-13: qty 1000

## 2018-09-13 MED ORDER — DEXAMETHASONE SODIUM PHOSPHATE 10 MG/ML IJ SOLN
INTRAMUSCULAR | Status: AC
  Filled 2018-09-13: qty 1

## 2018-09-13 MED ORDER — FENTANYL CITRATE (PF) 100 MCG/2ML IJ SOLN
25.0000 ug | INTRAMUSCULAR | Status: DC | PRN
  Filled 2018-09-13: qty 1

## 2018-09-13 MED ORDER — PROPOFOL 10 MG/ML IV BOLUS
INTRAVENOUS | Status: DC | PRN
  Administered 2018-09-13: 150 mg via INTRAVENOUS
  Administered 2018-09-13: 30 mg via INTRAVENOUS

## 2018-09-13 MED ORDER — PROPOFOL 10 MG/ML IV BOLUS
INTRAVENOUS | Status: AC
  Filled 2018-09-13: qty 40

## 2018-09-13 MED ORDER — METOCLOPRAMIDE HCL 5 MG/ML IJ SOLN
10.0000 mg | Freq: Once | INTRAMUSCULAR | Status: DC | PRN
  Filled 2018-09-13: qty 2

## 2018-09-13 MED ORDER — DEXAMETHASONE SODIUM PHOSPHATE 10 MG/ML IJ SOLN
INTRAMUSCULAR | Status: DC | PRN
  Administered 2018-09-13: 10 mg via INTRAVENOUS

## 2018-09-13 MED ORDER — FENTANYL CITRATE (PF) 100 MCG/2ML IJ SOLN
INTRAMUSCULAR | Status: AC
  Filled 2018-09-13: qty 2

## 2018-09-13 MED ORDER — LIDOCAINE 2% (20 MG/ML) 5 ML SYRINGE
INTRAMUSCULAR | Status: DC | PRN
  Administered 2018-09-13: 100 mg via INTRAVENOUS

## 2018-09-13 MED ORDER — CEFAZOLIN SODIUM-DEXTROSE 2-4 GM/100ML-% IV SOLN
2.0000 g | Freq: Once | INTRAVENOUS | Status: AC
  Administered 2018-09-13: 13:00:00 2 g via INTRAVENOUS
  Filled 2018-09-13: qty 100

## 2018-09-13 MED ORDER — SODIUM CHLORIDE 0.9 % IV SOLN
INTRAVENOUS | Status: AC | PRN
  Administered 2018-09-13: 200 mL via INTRAMUSCULAR

## 2018-09-13 SURGICAL SUPPLY — 40 items
BAG URINE DRAINAGE (UROLOGICAL SUPPLIES) ×4 IMPLANT
BLADE CLIPPER SENSICLIP SURGIC (BLADE) ×4 IMPLANT
CATH FOLEY 2WAY SLVR  5CC 16FR (CATHETERS) ×4
CATH FOLEY 2WAY SLVR 5CC 16FR (CATHETERS) ×4 IMPLANT
CATH ROBINSON RED A/P 20FR (CATHETERS) ×4 IMPLANT
CLOTH BEACON ORANGE TIMEOUT ST (SAFETY) ×4 IMPLANT
CONT SPECI 4OZ STER CLIK (MISCELLANEOUS) ×8 IMPLANT
COVER BACK TABLE 60X90IN (DRAPES) ×4 IMPLANT
COVER MAYO STAND STRL (DRAPES) ×4 IMPLANT
COVER WAND RF STERILE (DRAPES) ×4 IMPLANT
DRSG TEGADERM 4X4.75 (GAUZE/BANDAGES/DRESSINGS) ×6 IMPLANT
DRSG TEGADERM 8X12 (GAUZE/BANDAGES/DRESSINGS) ×8 IMPLANT
GAUZE SPONGE 4X4 12PLY STRL (GAUZE/BANDAGES/DRESSINGS) ×2 IMPLANT
GLOVE BIO SURGEON STRL SZ8 (GLOVE) ×6 IMPLANT
GLOVE BIOGEL PI IND STRL 7.0 (GLOVE) IMPLANT
GLOVE BIOGEL PI IND STRL 8 (GLOVE) IMPLANT
GLOVE BIOGEL PI IND STRL 8.5 (GLOVE) IMPLANT
GLOVE BIOGEL PI INDICATOR 7.0 (GLOVE) ×2
GLOVE BIOGEL PI INDICATOR 8 (GLOVE)
GLOVE BIOGEL PI INDICATOR 8.5 (GLOVE) ×2
GLOVE SURG ORTHO 8.5 STRL (GLOVE) ×10 IMPLANT
GLOVE SURG SS PI 8.5 STRL IVOR (GLOVE) ×2
GLOVE SURG SS PI 8.5 STRL STRW (GLOVE) IMPLANT
GOWN STRL REUS W/TWL XL LVL3 (GOWN DISPOSABLE) ×10 IMPLANT
HOLDER FOLEY CATH W/STRAP (MISCELLANEOUS) ×4 IMPLANT
I-Seed AgX100 ×166 IMPLANT
IMPL SPACEOAR SYSTEM 10ML (Spacer) IMPLANT
IMPLANT SPACEOAR SYSTEM 10ML (Spacer) ×4 IMPLANT
IV NS 1000ML (IV SOLUTION) ×2
IV NS 1000ML BAXH (IV SOLUTION) ×2 IMPLANT
KIT TURNOVER CYSTO (KITS) ×4 IMPLANT
MANIFOLD NEPTUNE II (INSTRUMENTS) IMPLANT
MARKER SKIN DUAL TIP RULER LAB (MISCELLANEOUS) ×4 IMPLANT
PACK CYSTO (CUSTOM PROCEDURE TRAY) ×4 IMPLANT
SURGILUBE 2OZ TUBE FLIPTOP (MISCELLANEOUS) ×4 IMPLANT
SYR 10ML LL (SYRINGE) ×10 IMPLANT
TOWEL OR 17X26 10 PK STRL BLUE (TOWEL DISPOSABLE) ×6 IMPLANT
UNDERPAD 30X30 (UNDERPADS AND DIAPERS) ×8 IMPLANT
WATER STERILE IRR 3000ML UROMA (IV SOLUTION) ×2 IMPLANT
WATER STERILE IRR 500ML POUR (IV SOLUTION) ×4 IMPLANT

## 2018-09-13 NOTE — Anesthesia Preprocedure Evaluation (Signed)
Anesthesia Evaluation  Patient identified by MRN, date of birth, ID band Patient awake    Reviewed: Allergy & Precautions, NPO status , Patient's Chart, lab work & pertinent test results  Airway Mallampati: II  TM Distance: >3 FB Neck ROM: Full    Dental no notable dental hx.    Pulmonary neg pulmonary ROS, former smoker,    Pulmonary exam normal breath sounds clear to auscultation       Cardiovascular hypertension, Pt. on medications + CAD and + CABG (1999)  negative cardio ROS Normal cardiovascular exam Rhythm:Regular Rate:Normal     Neuro/Psych Dementia negative neurological ROS     GI/Hepatic negative GI ROS, (+)     substance abuse (quit 2012)  alcohol use,   Endo/Other  negative endocrine ROS  Renal/GU negative Renal ROS  negative genitourinary   Musculoskeletal negative musculoskeletal ROS (+)   Abdominal   Peds negative pediatric ROS (+)  Hematology negative hematology ROS (+)   Anesthesia Other Findings   Reproductive/Obstetrics negative OB ROS                             Anesthesia Physical Anesthesia Plan  ASA: III  Anesthesia Plan: General   Post-op Pain Management:    Induction: Intravenous  PONV Risk Score and Plan: 2 and Ondansetron and Treatment may vary due to age or medical condition  Airway Management Planned: LMA  Additional Equipment:   Intra-op Plan:   Post-operative Plan: Extubation in OR  Informed Consent: I have reviewed the patients History and Physical, chart, labs and discussed the procedure including the risks, benefits and alternatives for the proposed anesthesia with the patient or authorized representative who has indicated his/her understanding and acceptance.     Dental advisory given  Plan Discussed with: CRNA  Anesthesia Plan Comments:         Anesthesia Quick Evaluation

## 2018-09-13 NOTE — Transfer of Care (Signed)
Immediate Anesthesia Transfer of Care Note  Patient: Larry Mullins  Procedure(s) Performed: RADIOACTIVE SEED IMPLANT/BRACHYTHERAPY IMPLANT (N/A Prostate) SPACE OAR INSTILLATION (N/A )  Patient Location: PACU  Anesthesia Type:General  Level of Consciousness: drowsy  Airway & Oxygen Therapy: Patient Spontanous Breathing and Patient connected to nasal cannula oxygen  Post-op Assessment: Report given to RN  Post vital signs: Reviewed and stable  Last Vitals:  Vitals Value Taken Time  BP 115/72 09/13/18 1430  Temp    Pulse 64 09/13/18 1432  Resp 9 09/13/18 1432  SpO2 100 % 09/13/18 1432  Vitals shown include unvalidated device data.  Last Pain:  Vitals:   09/13/18 1151  TempSrc:   PainSc: 0-No pain      Patients Stated Pain Goal: 6 (83/15/17 6160)  Complications: No apparent anesthesia complications

## 2018-09-13 NOTE — Anesthesia Procedure Notes (Signed)
Procedure Name: LMA Insertion Date/Time: 09/13/2018 1:18 PM Performed by: Bonney Aid, CRNA Pre-anesthesia Checklist: Patient identified, Emergency Drugs available, Suction available and Patient being monitored Patient Re-evaluated:Patient Re-evaluated prior to induction Oxygen Delivery Method: Circle system utilized Preoxygenation: Pre-oxygenation with 100% oxygen Induction Type: IV induction Ventilation: Mask ventilation without difficulty LMA: LMA inserted LMA Size: 5.0 Number of attempts: 1 Airway Equipment and Method: Bite block Placement Confirmation: positive ETCO2 Tube secured with: Tape Dental Injury: Teeth and Oropharynx as per pre-operative assessment

## 2018-09-13 NOTE — Op Note (Signed)
PRE-OPERATIVE DIAGNOSIS:  Adenocarcinoma of the prostate  POST-OPERATIVE DIAGNOSIS:  Same  PROCEDURE:  Procedure(s): 1. I-125 radioactive seed implantation 2. Cystoscopy 3. Placement of SpaceOAR  SURGEON:  Surgeon(s): Nicolette Bang, MD  Radiation oncologist: Tyler Pita, MD  ANESTHESIA:  General  EBL:  Minimal  DRAINS: 27 French Foley catheter  INDICATION: Larry Mullins is a 68 year old with a history of T1c prostate cancer. After discussing treatment options he has elected to proceed with brachytherapy  Description of procedure: After informed consent the patient was brought to the major OR, placed on the table and administered general anesthesia. He was then moved to the modified lithotomy position with his perineum perpendicular to the floor. His perineum and genitalia were then sterilely prepped. An official timeout was then performed. A 16 French Foley catheter was then placed in the bladder and filled with dilute contrast, a rectal tube was placed in the rectum and the transrectal ultrasound probe was placed in the rectum and affixed to the stand. He was then sterilely draped.  Real time ultrasonography was used along with the seed planning software [employer] Prostate vs. 4.2.21. This was used to develop the seed plan including the number of needles as well as number of seeds required for complete and adequate coverage. Real-time ultrasonography was then used along with the previously developed plan and the Nucletron device to implant a total of 83 seeds using 29 needles. This proceeded without difficulty or complication.  We then proceeded to mix the SpaceOAR using the kit supplied from the manufacturer. Once this was complete we placed a sinal needle into the perirectal fat between the rectum and the prostate. Once this was accomplished we injected 2cc of normal saline to hydrodissect the plain. We then instilled the the SpaceOAR through the spinal needle and noted good  distribution in the perirectal fat.    A Foley catheter was then removed as well as the transrectal ultrasound probe and rectal probe. Flexible cystoscopy was then performed using the 17 French flexible scope which revealed a normal urethra throughout its length down to the sphincter which appeared intact. The prostatic urethra revealed bilobar hypertrophy but no evidence of obstruction, seeds, spacers or lesions. The bladder was then entered and fully and systematically inspected. The ureteral orifices were noted to be of normal configuration and position. The mucosa revealed no evidence of tumors. There were also no stones identified within the bladder. I noted no seeds or spacers on the floor of the bladder and retroflexion of the scope revealed no seeds protruding from the base of the prostate.  The cystoscope was then removed and a new 71 French Foley catheter was then inserted and the balloon was filled with 10 cc of sterile water. This was connected to closed system drainage and the patient was awakened and taken to recovery room in stable and satisfactory condition. He tolerated procedure well and there were no intraoperative complications.

## 2018-09-13 NOTE — Discharge Instructions (Signed)
Indwelling Urinary Catheter Care, Adult °An indwelling urinary catheter is a thin tube that is put into your bladder. The tube helps to drain pee (urine) out of your body. The tube goes in through your urethra. Your urethra is where pee comes out of your body. Your pee will come out through the catheter, then it will go into a bag (drainage bag). °Take good care of your catheter so it will work well. °How to wear your catheter and bag °Supplies needed °· Sticky tape (adhesive tape) or a leg strap. °· Alcohol wipe or soap and water (if you use tape). °· A clean towel (if you use tape). °· Large overnight bag. °· Smaller bag (leg bag). °Wearing your catheter °Attach your catheter to your leg with tape or a leg strap. °· Make sure the catheter is not pulled tight. °· If a leg strap gets wet, take it off and put on a dry strap. °· If you use tape to hold the bag on your leg: °1. Use an alcohol wipe or soap and water to wash your skin where the tape made it sticky before. °2. Use a clean towel to pat-dry that skin. °3. Use new tape to make the bag stay on your leg. °Wearing your bags °You should have been given a large overnight bag. °· You may wear the overnight bag in the day or night. °· Always have the overnight bag lower than your bladder.  Do not let the bag touch the floor. °· Before you go to sleep, put a clean plastic bag in a wastebasket. Then hang the overnight bag inside the wastebasket. °You should also have a smaller leg bag that fits under your clothes. °· Always wear the leg bag below your knee. °· Do not wear your leg bag at night. °How to care for your skin and catheter °Supplies needed °· A clean washcloth. °· Water and mild soap. °· A clean towel. °Caring for your skin and catheter ° °  ° °· Clean the skin around your catheter every day: °? Wash your hands with soap and water. °? Wet a clean washcloth in warm water and mild soap. °? Clean the skin around your urethra. °? If you are male: °? Gently  spread the folds of skin around your vagina (labia). °? With the washcloth in your other hand, wipe the inner side of your labia on each side. Wipe from front to back. °? If you are male: °? Pull back any skin that covers the end of your penis (foreskin). °? With the washcloth in your other hand, wipe your penis in small circles. Start wiping at the tip of your penis, then move away from the catheter. °? Move the foreskin back in place, if needed. °? With your free hand, hold the catheter close to where it goes into your body. °? Keep holding the catheter during cleaning so it does not get pulled out. °? With the washcloth in your other hand, clean the catheter. °? Only wipe downward on the catheter. °? Do not wipe upward toward your body. Doing this may push germs into your urethra and cause infection. °? Use a clean towel to pat-dry the catheter and the skin around it. Make sure to wipe off all soap. °? Wash your hands with soap and water. °· Shower every day. Do not take baths. °· Do not use cream, ointment, or lotion on the area where the catheter goes into your body, unless your doctor tells you   to. °· Do not use powders, sprays, or lotions on your genital area. °· Check your skin around the catheter every day for signs of infection. Check for: °? Redness, swelling, or pain. °? Fluid or blood. °? Warmth. °? Pus or a bad smell. °How to empty the bag °Supplies needed °· Rubbing alcohol. °· Gauze pad or cotton ball. °· Tape or a leg strap. °Emptying the bag °Pour the pee out of your bag when it is ?-½ full, or at least 2-3 times a day. Do this for your overnight bag and your leg bag. °1. Wash your hands with soap and water. °2. Separate (detach) the bag from your leg. °3. Hold the bag over the toilet or a clean pail. Keep the bag lower than your hips and bladder. This is so the pee (urine) does not go back into the tube. °4. Open the pour spout. It is at the bottom of the bag. °5. Empty the pee into the toilet or  pail. Do not let the pour spout touch any surface. °6. Put rubbing alcohol on a gauze pad or cotton ball. °7. Use the gauze pad or cotton ball to clean the pour spout. °8. Close the pour spout. °9. Attach the bag to your leg with tape or a leg strap. °10. Wash your hands with soap and water. °Follow instructions for cleaning the drainage bag: °· From the product maker. °· As told by your doctor. °How to change the bag °Supplies needed °· Alcohol wipes. °· A clean bag. °· Tape or a leg strap. °Changing the bag °Replace your bag when it starts to leak, smell bad, or look dirty. °1. Wash your hands with soap and water. °2. Separate the dirty bag from your leg. °3. Pinch the catheter with your fingers so that pee does not spill out. °4. Separate the catheter tube from the bag tube where these tubes connect (at the connection valve). Do not let the tubes touch any surface. °5. Clean the end of the catheter tube with an alcohol wipe. Use a different alcohol wipe to clean the end of the bag tube. °6. Connect the catheter tube to the tube of the clean bag. °7. Attach the clean bag to your leg with tape or a leg strap. Do not make the bag tight on your leg. °8. Wash your hands with soap and water. °General rules ° °· Never pull on your catheter. Never try to take it out. Doing that can hurt you. °· Always wash your hands before and after you touch your catheter or bag. Use a mild, fragrance-free soap. If you do not have soap and water, use hand sanitizer. °· Always make sure there are no twists or bends (kinks) in the catheter tube. °· Always make sure there are no leaks in the catheter or bag. °· Drink enough fluid to keep your pee pale yellow. °· Do not take baths, swim, or use a hot tub. °· If you are male, wipe from front to back after you poop (have a bowel movement). °Contact a doctor if: °· Your pee is cloudy. °· Your pee smells worse than usual. °· Your catheter gets clogged. °· Your catheter leaks. °· Your bladder  feels full. °Get help right away if: °· You have redness, swelling, or pain where the catheter goes into your body. °· You have fluid, blood, pus, or a bad smell coming from the area where the catheter goes into your body. °· Your skin feels warm where   the catheter goes into your body.  You have a fever.  You have pain in your: ? Belly (abdomen). ? Legs. ? Lower back. ? Bladder.  You see blood in the catheter.  Your pee is pink or red.  You feel sick to your stomach (nauseous).  You throw up (vomit).  You have chills.  Your pee is not draining into the bag.  Your catheter gets pulled out. Summary  An indwelling urinary catheter is a thin tube that is placed into the bladder to help drain pee (urine) out of the body.  The catheter is placed into the part of the body that drains pee from the bladder (urethra).  Taking good care of your catheter will keep it working properly and help prevent problems.  Always wash your hands before and after touching your catheter or bag.  Never pull on your catheter or try to take it out. This information is not intended to replace advice given to you by your health care provider. Make sure you discuss any questions you have with your health care provider. Document Released: 05/10/2012 Document Revised: 05/07/2018 Document Reviewed: 08/29/2016 Elsevier Patient Education  [place] Instructions   Activity:    Rest for the remainder of the day.  Do not drive or operate equipment today.  You may resume normal  activities in a few days as instructed by your physician, without risk of harmful radiation exposure to those around you, provided you follow the time and distance precautions on the Radiation Oncology Instruction Sheet.   Meals: Drink plenty of lipuids and eat light foods, such as gelatin or soup this evening .  You may return to normal meal plan tomorrow.  Return To Work: You may return  to work as instructed by [employer].  Special Instruction:   If any seeds are found, use tweezers to pick up seeds and place in a glass container of any kind and bring to your physician's office.  Call your physician if any of these symptoms occur:   Persistent or heavy bleeding  Urine stream diminishes or stops completely after catheter is removed  Fever equal to or greater than 101 degrees F  Cloudy urine with a strong foul odor  Severe pain  You may feel some burning pain and/or hesitancy when you urinate after the catheter is removed.  These symptoms may increase over the next few weeks, but should diminish within forur to six weeks.  Applying moist heat to the lower abdomen or a hot tub bath may help relieve the pain.  If the discomfort becomes severe, please call your physician for additional medications.   Post Anesthesia Home Care Instructions  Activity: Get plenty of rest for the remainder of the day. A responsible individual must stay with you for 24 hours following the procedure.  For the next 24 hours, DO NOT: -Drive a car -Paediatric nurse -Drink alcoholic beverages -Take any medication unless instructed by your physician -Make any legal decisions or sign important papers.  Meals: Start with liquid foods such as gelatin or soup. Progress to regular foods as tolerated. Avoid greasy, spicy, heavy foods. If nausea and/or vomiting occur, drink only clear liquids until the nausea and/or vomiting subsides. Call your physician if vomiting continues.  Special Instructions/Symptoms: Your throat may feel dry or sore from the anesthesia or the breathing tube placed in your throat during surgery. If this causes discomfort, gargle with warm salt water. The discomfort  should disappear within 24 hours.  If you had a scopolamine patch placed behind your ear for the management of post- operative nausea and/or vomiting:  1. The medication in the patch is effective for 72  hours, after which it should be removed.  Wrap patch in a tissue and discard in the trash. Wash hands thoroughly with soap and water. 2. You may remove the patch earlier than 72 hours if you experience unpleasant side effects which may include dry mouth, dizziness or visual disturbances. 3. Avoid touching the patch. Wash your hands with soap and water after contact with the patch.

## 2018-09-13 NOTE — Progress Notes (Signed)
  Radiation Oncology         [phone] ________________________________  Name: USAMA HARKLESS MRN: [id number]  Date: 09/13/2018  DOB: [birth year]       Prostate Seed Implant  AE:SLPNP, Purcell Nails, MD  No ref. provider found  DIAGNOSIS:  68 y.o. gentleman with Stage T1c adenocarcinoma of the prostate with Gleason score of 4+3 and PSA of 11.7     ICD-10-CM   1. Preop testing  Z01.818 DG Chest 2 View    DG Chest 2 View    PROCEDURE: Insertion of radioactive I-125 seeds into the prostate gland.  RADIATION DOSE: 145 Gy, definitive therapy.  TECHNIQUE: MASAMI PLATA was brought to the operating room with the urologist. He was placed in the dorsolithotomy position. He was catheterized and a rectal tube was inserted. The perineum was shaved, prepped and draped. The ultrasound probe was then introduced into the rectum to see the prostate gland.  TREATMENT DEVICE: A needle grid was attached to the ultrasound probe stand and anchor needles were placed.  3D PLANNING: The prostate was imaged in 3D using a sagittal sweep of the prostate probe. These images were transferred to the planning computer. There, the prostate, urethra and rectum were defined on each axial reconstructed image. Then, the software created an optimized 3D plan and a few seed positions were adjusted. The quality of the plan was reviewed using [facility] information for the target and the following two organs at risk:  Urethra and Rectum.  Then the accepted plan was printed and handed off to the radiation therapist.  Under my supervision, the custom loading of the seeds and spacers was carried out and loaded into sealed vicryl sleeves.  These pre-loaded needles were then placed into the needle holder.[name]  PROSTATE VOLUME STUDY:  Using transrectal ultrasound the volume of the prostate was verified to be 44.8 cc.  SPECIAL TREATMENT PROCEDURE/SUPERVISION AND HANDLING: The pre-loaded needles were then delivered under sagittal  guidance. A total of 29 needles were used to deposit 83 seeds in the prostate gland. The individual seed activity was 0.396 mCi.  SpaceOAR:  Yes  COMPLEX SIMULATION: At the end of the procedure, an anterior radiograph of the pelvis was obtained to document seed positioning and count. Cystoscopy was performed to check the urethra and bladder.  MICRODOSIMETRY: At the end of the procedure, the patient was emitting 0.174 mR/hr at 1 meter. Accordingly, he was considered safe for hospital discharge.  PLAN: The patient will return to the radiation oncology clinic for post implant CT dosimetry in three weeks.   ________________________________  Sheral Apley Tammi Klippel, M.D.

## 2018-09-13 NOTE — H&P (Signed)
Urology Admission H&P  Chief Complaint: prostate cancer  History of Present Illness: Larry Mullins is a 68yo with a hx of T1c prostate cancer here for brachytherapy with SpaceOAR. He has mild LUTS. No fevers/chills/sweats. Mild ED. The patient stopped coumadin 5 days ago.  Past Medical History:  Diagnosis Date  . Alcohol abuse, in remission    since 2012  . Anticoagulated on Coumadin    managed by dr Gerarda Fraction  . Avascular necrosis of bone of hip, left ([place])   . CAD (coronary artery disease) cardiologist-  dr Natividad Brood in epic 03-09-2017, pt did want nuclear stress test ;  currently being followed by pcp   a. s/p CABG in 1999.  [name] Cerebral atrophy ([place])   . Chronic hip pain   . Chronic pain syndrome   . Dementia with behavioral disturbance [facility])    neurologist--  dr Charise Carwin [facility] in [place])--- per wife pt can anger quickly by is not aggressive  . Diverticulosis of colon   . History of gout yrs ago  . History of Helicobacter pylori infection    01/ 2019 and treated by dr Gerarda Fraction  . 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 Hyperlipidemia   . Hypertension   . Impairment of short-term and long-term memory    due to dementia  . OA (osteoarthritis)   . Prostate [facility]) urologist-  dr Alyson Ingles  oncologist-- dr Tammi Klippel   dx 06-04-2018-- Stage T1c, Gleason 4+3  . S/P CABG x 4 1999  . Schatzki's ring    s/p dilatation  . Wears glasses    Past Surgical History:  Procedure Laterality Date  . BIOPSY  02/10/2017   Procedure: BIOPSY;  Surgeon: Danie Binder, MD;  Location: [facility] ENDO SUITE;  Service: Endoscopy;;  gastric biopsy   . COLONOSCOPY WITH PROPOFOL N/A 02/10/2017   Colonoscopy with normal TI, diverticulosis in rectosigmoid colon, sigmoid, descending, and ascending colon. Rectal bleeding likely due to inflamed hemorrhoids.   .  CORONARY ARTERY BYPASS GRAFT  06/29/1997   @[facility]    4 vessel. no stents per patient  . ESOPHAGOGASTRODUODENOSCOPY (EGD) WITH PROPOFOL N/A 02/10/2017   widely patent Schatzki's ring s/p dilation, H.pylori gastritis.  [name] HEMORRHOID SURGERY N/A 02/11/2017   Procedure: HEMORRHOIDECTOMY;  Surgeon: Aviva Signs, MD;  Location: [facility] ORS;  Service: General;  Laterality: N/A;    Home Medications:  Current Facility-Administered Medications  Medication Dose Route Frequency Provider Last Rate Last Dose  . ceFAZolin (ANCEF) IVPB 2g/100 mL premix  2 g Intravenous Once Cleon Gustin, MD      . lactated ringers infusion   Intravenous Continuous Lyn Hollingshead, MD 50 mL/hr at 09/13/18 1141 1,000 mL at 09/13/18 1141  . [START ON 09/14/2018] sodium phosphate (FLEET) 7-19 GM/118ML enema 1 enema  1 enema Rectal Once Deondrae Mcgrail, Candee Furbish, MD       Allergies:  Allergies  Allergen Reactions  . Lopressor [Metoprolol Tartrate] Hives    Family History  Problem Relation Age of Onset  . Colon polyps Sister        unsure age of surgery, had to have colon resection   . Cancer Maternal Aunt        unknown type  . Cancer Maternal Aunt        unknown  . Colon cancer Neg Hx   . Liver disease  Neg Hx    Social History:  reports that he quit smoking about 6 years ago. His smoking use included cigarettes. He has a 10.00 pack-year smoking history. He has never used smokeless tobacco. He reports previous alcohol use. He reports previous drug use.  Review of Systems  All other systems reviewed and are negative.   Physical Exam:  Vital signs in last 24 hours: Temp:  [97.7 F (36.5 C)] 97.7 F (36.5 C) (08/17 1114) Pulse Rate:  [59] 59 (08/17 1114) Resp:  [16] 16 (08/17 1114) BP: (125)/(76) 125/76 (08/17 1114) SpO2:  [96 %] 96 % (08/17 1114) Weight:  [84 kg] 84 kg (08/17 1114) Physical Exam  Constitutional: He is oriented to person, place, and time. He appears well-developed and well-nourished.  HENT:   Head: Normocephalic and atraumatic.  Eyes: Pupils are equal, round, and reactive to light. EOM are normal.  Neck: Normal range of motion. No thyromegaly present.  Cardiovascular: Normal rate and regular rhythm.  Respiratory: Effort normal. No respiratory distress.  GI: Soft. He exhibits no distension.  Musculoskeletal: Normal range of motion.        General: No edema.  Neurological: He is alert and oriented to person, place, and time.  Skin: Skin is warm and dry.  Psychiatric: He has a normal mood and affect. His behavior is normal. Judgment and thought content normal.    Laboratory Data:  No results found for this or any previous visit (from the past 24 hour(s)). Recent Results (from the past 240 hour(s))  SARS CORONAVIRUS 2 Nasal Swab Aptima Multi Swab     Status: None   Collection Time: 09/09/18  2:55 PM   Specimen: Aptima Multi Swab; Nasal Swab  Result Value Ref Range Status   SARS Coronavirus 2 NEGATIVE NEGATIVE Final    Comment: (NOTE) SARS-CoV-2 target nucleic acids are NOT DETECTED. The SARS-CoV-2 RNA is generally detectable in upper and lower respiratory specimens during the acute phase of infection. Negative results do not preclude SARS-CoV-2 infection, do not rule out co-infections with other pathogens, and should not be used as the sole basis for treatment or other patient management decisions. Negative results must be combined with clinical observations, patient history, and epidemiological information. The expected result is Negative. Fact Sheet for Patients: SugarRoll.be Fact Sheet for Healthcare Providers: https://www.woods-mathews.com/ This test is not yet approved or cleared by the [place] FDA and  has been authorized for detection and/or diagnosis of SARS-CoV-2 by FDA under an Emergency Use Authorization (EUA). This EUA will remain  in effect (meaning this test can be used) for the duration of the COVID-19  declaration under Section 56 4(b)(1) of the Act, 21 U.S.C. section 360bbb-3(b)(1), unless the authorization is terminated or revoked sooner. Performed at [facility], [place] [address] [place], [place] [address]    Creatinine: Recent Labs    09/09/18 1311  CREATININE 1.06   Baseline Creatinine: 1  Impression/Assessment:  68yo with Prostate cancer  Plan:  The risks/benefits/alterantives to brachytherapy with SpaceOAR was explained to the patient and he understands and wishes to proceed with surgery.   Nicolette Bang 09/13/2018, 12:01 PM

## 2018-09-14 NOTE — Anesthesia Postprocedure Evaluation (Signed)
Anesthesia Post Note  Patient: Larry Mullins  Procedure(s) Performed: RADIOACTIVE SEED IMPLANT/BRACHYTHERAPY IMPLANT (N/A Prostate) SPACE OAR INSTILLATION (N/A )     Patient location during evaluation: PACU Anesthesia Type: General Level of consciousness: awake and alert Pain management: pain level controlled Vital Signs Assessment: post-procedure vital signs reviewed and stable Respiratory status: spontaneous breathing, nonlabored ventilation, respiratory function stable and patient connected to nasal cannula oxygen Cardiovascular status: blood pressure returned to baseline and stable Postop Assessment: no apparent nausea or vomiting Anesthetic complications: no    Last Vitals:  Vitals:   09/13/18 1515 09/13/18 1610  BP: 120/71 (!) 121/50  Pulse: (!) 52 84  Resp: (!) 8   Temp:  36.5 C  SpO2: 100% 100%    Last Pain:  Vitals:   09/13/18 1610  TempSrc: Axillary  PainSc: 2                  Larry Mullins

## 2018-09-15 ENCOUNTER — Encounter (HOSPITAL_BASED_OUTPATIENT_CLINIC_OR_DEPARTMENT_OTHER): Payer: Self-pay | Admitting: Urology

## 2018-09-20 DIAGNOSIS — C61 Malignant neoplasm of prostate: Secondary | ICD-10-CM | POA: Diagnosis not present

## 2018-09-30 ENCOUNTER — Telehealth: Payer: Self-pay | Admitting: *Deleted

## 2018-09-30 NOTE — Telephone Encounter (Signed)
Called patient to remind of post seed appts. for 10-07-18 and his MRI , spoke with patient's wife- Loletha Carrow and she is aware of these appts.

## 2018-10-07 ENCOUNTER — Ambulatory Visit (HOSPITAL_COMMUNITY)
Admission: RE | Admit: 2018-10-07 | Discharge: 2018-10-07 | Disposition: A | Discharge status: Home / Self Care | Payer: Medicare Other | Source: Ambulatory Visit | Attending: Urology | Admitting: Urology

## 2018-10-07 ENCOUNTER — Other Ambulatory Visit: Payer: Self-pay

## 2018-10-07 ENCOUNTER — Inpatient Hospital Stay
Admission: RE | Admit: 2018-10-07 | Discharge: 2018-10-07 | Disposition: A | Discharge status: Home / Self Care | Payer: Medicare Other | Source: Ambulatory Visit | Attending: Urology | Admitting: Urology

## 2018-10-07 ENCOUNTER — Ambulatory Visit
Admission: RE | Admit: 2018-10-07 | Discharge: 2018-10-07 | Disposition: A | Discharge status: Home / Self Care | Payer: Medicare Other | Source: Ambulatory Visit | Attending: Radiation Oncology | Admitting: Radiation Oncology

## 2018-10-07 ENCOUNTER — Ambulatory Visit
Admission: RE | Admit: 2018-10-07 | Discharge: 2018-10-07 | Disposition: A | Discharge status: Home / Self Care | Payer: Medicare Other | Source: Ambulatory Visit | Attending: Urology | Admitting: Urology

## 2018-10-07 ENCOUNTER — Telehealth: Payer: Self-pay | Admitting: Radiation Oncology

## 2018-10-07 DIAGNOSIS — C61 Malignant neoplasm of prostate: Secondary | ICD-10-CM | POA: Insufficient documentation

## 2018-10-07 NOTE — Telephone Encounter (Signed)
Called patient to inquire about status since he didn't appear in the clinic for his post seed simulation and follow up with Ashlyn Bruning, PA-C. Spoke with patient's wife, Larry Mullins. She explains they arrived at 1 pm to the [facility] "but were turned away and told they didn't have an appointment." She goes onto explain they are in radiology now preparing for his MRI. Rescheduled the patient's simulation and follow up for Tuesday, September 15th at 0900. Provided Vicky with my direct number in the event problems are encountered on Tuesday. She verbalized understanding and expressed appreciation for the call.

## 2018-10-12 ENCOUNTER — Other Ambulatory Visit: Payer: Self-pay

## 2018-10-12 ENCOUNTER — Ambulatory Visit
Admission: RE | Admit: 2018-10-12 | Discharge: 2018-10-12 | Disposition: A | Discharge status: Home / Self Care | Payer: Medicare Other | Source: Ambulatory Visit | Attending: Urology | Admitting: Urology

## 2018-10-12 ENCOUNTER — Ambulatory Visit
Admission: RE | Admit: 2018-10-12 | Discharge: 2018-10-12 | Disposition: A | Discharge status: Home / Self Care | Payer: Medicare Other | Source: Ambulatory Visit | Attending: Radiation Oncology | Admitting: Radiation Oncology

## 2018-10-12 ENCOUNTER — Ambulatory Visit: Payer: Medicare Other | Admitting: Urology

## 2018-10-12 DIAGNOSIS — C61 Malignant neoplasm of prostate: Secondary | ICD-10-CM

## 2018-10-12 NOTE — Progress Notes (Signed)
Radiation Oncology         [phone] ________________________________  Name: Larry Mullins MRN: [id number]  Date: 10/12/2018  DOB: [birth year]  Post-Seed Follow-Up Visit Note  CC: Redmond School, MD  Redmond School, MD  Diagnosis:   68 y.o. gentleman with Stage T1c adenocarcinoma of the prostate with Gleason score of 4+3 and PSA of 11.7     ICD-10-CM   1. Malignant neoplasm of prostate ([place])  C61     Interval Since Last Radiation:  4 weeks 09/13/18:  Insertion of radioactive I-125 seeds into the prostate gland; 145 Gy, definitive/boost therapy with placement of SpaceOAR gel.  Narrative:  The patient returns today for routine follow-up.  He is complaining of mild increased urinary frequency and urinary hesitation symptoms. He filled out a questionnaire regarding urinary function today providing and overall IPSS score of 7 characterizing his symptoms as mild with mainly increased frequency.  His pre-implant score was 0.  He reports nocturia x1 and mild residual urgency but specifically denies dysuria, gross hematuria, straining to void, incomplete bladder emptying or incontinence.  He reports a healthy appetite and denies any abdominal pain or bowel symptoms.  Overall, he is quite pleased with his progress to date.  ALLERGIES:  is allergic to lopressor [metoprolol tartrate].  Meds: Current Outpatient Medications  Medication Sig Dispense Refill  . alprazolam (XANAX) 2 MG tablet Take 4 mg by mouth at bedtime.     [name] aspirin EC 81 MG tablet Take 81 mg by mouth daily.    [name] atenolol (TENORMIN) 25 MG tablet Take 25 mg by mouth at bedtime.     [name] atorvastatin (LIPITOR) 10 MG tablet Take 10 mg by mouth at bedtime.     [name] buPROPion (WELLBUTRIN) 100 MG tablet Take 0.5 tablets by mouth 2 (two) times daily.     . memantine (NAMENDA) 10 MG tablet Take 10 mg by mouth 2 (two) times daily.    . polyethylene glycol (MIRALAX / GLYCOLAX) packet Take 17 g by mouth daily. 14 each 0  . QUEtiapine  (SEROQUEL) 200 MG tablet Take 200 mg by mouth at bedtime.    . traMADol (ULTRAM) 50 MG tablet Take 1 tablet (50 mg total) by mouth every 6 (six) hours as needed. 15 tablet 0  . traZODone (DESYREL) 100 MG tablet Take 100 mg by mouth at bedtime.    [name] warfarin (COUMADIN) 4 MG tablet Take 1 tablet by mouth daily at 12 noon.     No current facility-administered medications for this encounter.     Physical Findings: In general this is a well appearing Caucasian male in no acute distress. He's alert and oriented x4 and appropriate throughout the examination. Cardiopulmonary assessment is negative for acute distress and he exhibits normal effort.   Lab Findings: Lab Results  Component Value Date   WBC 4.9 09/09/2018   HGB 12.9 (L) 09/09/2018   HCT 41.6 09/09/2018   MCV 97.9 09/09/2018   PLT 187 09/09/2018    Radiographic Findings:  Patient underwent CT imaging in our clinic for post implant dosimetry. The CT will be reviewed by Dr. Tammi Klippel to confirm there is an adequate distribution of radioactive seeds throughout the prostate gland and ensure that there are no seeds in or near the rectum. His had his prostate MRI performed on 10/07/2018 and those images will be fused with his CT images for further evaluation. We suspect the final radiation plan and dosimetry will show appropriate coverage of the prostate gland. He  understands that we will call and inform him of any unexpected findings on further review of his imaging and dosimetry.  Impression/Plan: 68 y.o. gentleman with Stage T1c adenocarcinoma of the prostate with Gleason score of 4+3 and PSA of 11.7.  The patient is recovering from the effects of radiation. His urinary symptoms should gradually improve over the next 4-6 months. We talked about this today. He is encouraged by his improvement already and is otherwise pleased with his outcome. We also talked about long-term follow-up for prostate cancer following seed implant. He understands that  ongoing PSA determinations and digital rectal exams will help perform surveillance to rule out disease recurrence. He has a follow up appointment scheduled with Dr. Alyson Ingles in November 2020. He understands what to expect with his PSA measures. Patient was also educated today about some of the long-term effects from radiation including a small risk for rectal bleeding and possibly erectile dysfunction. We talked about some of the general management approaches to these potential complications. However, I did encourage the patient to contact our office or return at any point if he has questions or concerns related to his previous radiation and prostate cancer.    Nicholos Johns, PA-C

## 2018-10-12 NOTE — Progress Notes (Signed)
Pt presents with wife for post seed appt with Ashlyn. Pt denies dysuria. Pt reports rare hematuria, typically at the start of stream. Pt denies urine leakage. Pt denies diarrhea. Pt  Has had MRI. Pt's wife reports urology appt is not until November.   Loma Sousa, RN BSN

## 2018-10-12 NOTE — Patient Instructions (Signed)
Coronavirus (COVID-19) Are you at risk?  Are you at risk for the Coronavirus (COVID-19)?  To be considered HIGH RISK for Coronavirus (COVID-19), you have to meet the following criteria:  . Traveled to [place], [place], [place], [place] or [place]; or in the [place] to [place], [place], [place], or [place]; and have fever, cough, and shortness of breath within the last 2 weeks of travel OR . Been in close contact with a person diagnosed with COVID-19 within the last 2 weeks and have fever, cough, and shortness of breath . IF YOU DO NOT MEET THESE CRITERIA, YOU ARE CONSIDERED LOW RISK FOR COVID-19.  What to do if you are HIGH RISK for COVID-19?  . If you are having a medical emergency, call 911. . Seek medical care right away. Before you go to a doctor's office, urgent care or emergency department, call ahead and tell them about your recent travel, contact with someone diagnosed with COVID-19, and your symptoms. You should receive instructions from your physician's office regarding next steps of care.  . When you arrive at healthcare provider, tell the healthcare staff immediately you have returned from visiting [place], [place], [place], [place] or [place]; or traveled in the [place] to [place], [place], [place], or [place]; in the last two weeks or you have been in close contact with a person diagnosed with COVID-19 in the last 2 weeks.   . Tell the health care staff about your symptoms: fever, cough and shortness of breath. . After you have been seen by a medical provider, you will be either: o Tested for (COVID-19) and discharged home on quarantine except to seek medical care if symptoms worsen, and asked to  - Stay home and avoid contact with others until you get your results (4-5 days)  - Avoid travel on public transportation if possible (such as bus, train, or airplane) or o Sent to the Emergency Department by EMS for evaluation, COVID-19 testing, and possible  admission depending on your condition and test results.  What to do if you are LOW RISK for COVID-19?  Reduce your risk of any infection by using the same precautions used for avoiding the common cold or flu:  . Wash your hands often with soap and warm water for at least 20 seconds.  If soap and water are not readily available, use an alcohol-based hand sanitizer with at least 60% alcohol.  . If coughing or sneezing, cover your mouth and nose by coughing or sneezing into the elbow areas of your shirt or coat, into a tissue or into your sleeve (not your hands). . Avoid shaking hands with others and consider head nods or verbal greetings only. . Avoid touching your eyes, nose, or mouth with unwashed hands.  . Avoid close contact with people who are sick. . Avoid places or events with large numbers of people in one location, like concerts or sporting events. . Carefully consider travel plans you have or are making. . If you are planning any travel outside or inside the [place], visit the CDC's Travelers' Health webpage for the latest health notices. . If you have some symptoms but not all symptoms, continue to monitor at home and seek medical attention if your symptoms worsen. . If you are having a medical emergency, call 911.   ADDITIONAL HEALTHCARE OPTIONS FOR PATIENTS  [facility] / e-Visit: [email or web address]         [facility]: [phone]     Urgent Care: [phone]                   [facility]: [phone]   

## 2018-10-15 NOTE — Progress Notes (Signed)
  Radiation Oncology         [phone] ________________________________  Name: Larry Mullins MRN: [id number]  Date: 10/12/2018  DOB: November 18, 1950  COMPLEX SIMULATION NOTE  NARRATIVE:  The patient was brought to the [facility] suite today following prostate seed implantation approximately one month ago.  Identity was confirmed.  All relevant records and images related to the planned course of therapy were reviewed.  Then, the patient was set-up supine.  CT images were obtained.  The CT images were loaded into the planning software.  Then the prostate and rectum were contoured.  Treatment planning then occurred.  The implanted iodine 125 seeds were identified by the physics staff for projection of radiation distribution  I have requested : 3D Simulation  I have requested a DVH of the following structures: Prostate and rectum.    ________________________________  Sheral Apley Tammi Klippel, M.D.

## 2018-10-18 ENCOUNTER — Encounter: Payer: Self-pay | Admitting: Radiation Oncology

## 2018-10-18 DIAGNOSIS — C61 Malignant neoplasm of prostate: Secondary | ICD-10-CM | POA: Diagnosis not present

## 2018-10-24 NOTE — Progress Notes (Signed)
  Radiation Oncology         [phone] ________________________________  Name: Larry Mullins MRN: [id number]  Date: 10/18/2018  DOB: July 27, 1950  3D Planning Note   Prostate Brachytherapy Post-Implant Dosimetry  Diagnosis: 68 y.o. gentleman with Stage T1c adenocarcinoma of the prostate with Gleason score of 4+3 and PSA of 11.7  Narrative: On a previous date, Larry Mullins returned following prostate seed implantation for post implant planning. He underwent CT scan complex simulation to delineate the three-dimensional structures of the pelvis and demonstrate the radiation distribution.  Since that time, the seed localization, and complex isodose planning with dose volume histograms have now been completed.  Results:   Prostate Coverage - The dose of radiation delivered to the 90% or more of the prostate gland (D90) was 113.13% of the prescription dose. This exceeds our goal of greater than 90%. Rectal Sparing - The volume of rectal tissue receiving the prescription dose or higher was 0.0 cc. This falls under our thresholds tolerance of 1.0 cc.  Impression: The prostate seed implant appears to show adequate target coverage and appropriate rectal sparing.  Plan:  The patient will continue to follow with urology for ongoing PSA determinations. I would anticipate a high likelihood for local tumor control with minimal risk for rectal morbidity.  ________________________________  Sheral Apley Tammi Klippel, M.D.

## 2018-10-25 DIAGNOSIS — I10 Essential (primary) hypertension: Secondary | ICD-10-CM | POA: Diagnosis not present

## 2018-10-25 DIAGNOSIS — G4700 Insomnia, unspecified: Secondary | ICD-10-CM | POA: Diagnosis not present

## 2018-10-25 DIAGNOSIS — Z79899 Other long term (current) drug therapy: Secondary | ICD-10-CM | POA: Diagnosis not present

## 2018-10-25 DIAGNOSIS — F419 Anxiety disorder, unspecified: Secondary | ICD-10-CM | POA: Diagnosis not present

## 2018-11-08 DIAGNOSIS — M810 Age-related osteoporosis without current pathological fracture: Secondary | ICD-10-CM | POA: Diagnosis not present

## 2018-11-08 DIAGNOSIS — J9801 Acute bronchospasm: Secondary | ICD-10-CM | POA: Diagnosis not present

## 2018-11-08 DIAGNOSIS — E663 Overweight: Secondary | ICD-10-CM | POA: Diagnosis not present

## 2018-11-08 DIAGNOSIS — E7849 Other hyperlipidemia: Secondary | ICD-10-CM | POA: Diagnosis not present

## 2018-11-08 DIAGNOSIS — Z6828 Body mass index (BMI) 28.0-28.9, adult: Secondary | ICD-10-CM | POA: Diagnosis not present

## 2018-11-08 DIAGNOSIS — F419 Anxiety disorder, unspecified: Secondary | ICD-10-CM | POA: Diagnosis not present

## 2018-12-21 ENCOUNTER — Encounter: Payer: Self-pay | Admitting: Internal Medicine

## 2019-01-27 ENCOUNTER — Encounter: Payer: Self-pay | Admitting: Gastroenterology

## 2019-01-27 ENCOUNTER — Other Ambulatory Visit: Payer: Self-pay

## 2019-01-27 ENCOUNTER — Ambulatory Visit (INDEPENDENT_AMBULATORY_CARE_PROVIDER_SITE_OTHER): Payer: Medicare Other | Admitting: Gastroenterology

## 2019-01-27 VITALS — BP 117/70 | HR 61 | Temp 96.6°F | Ht 68.0 in | Wt 195.8 lb

## 2019-01-27 DIAGNOSIS — K5900 Constipation, unspecified: Secondary | ICD-10-CM | POA: Diagnosis not present

## 2019-01-27 DIAGNOSIS — K219 Gastro-esophageal reflux disease without esophagitis: Secondary | ICD-10-CM | POA: Diagnosis not present

## 2019-01-27 NOTE — Progress Notes (Signed)
Primary Care Physician:  Redmond School, MD Primary GI: Dr. Gala Romney   Chief Complaint  Patient presents with  . Gastroesophageal Reflux    doing ok    HPI:   Larry Mullins is a 68 y.o. 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 Felt that he does not have advanced disease despiet suggestion of possible cirrhosis several years ago. EGD without stigmata of advanced liver disease. History of H.pylori with documented eradication. Chronic constipation and GERD.   Taking Metamucil and Miralax daily with good BMs. BM at least every day. No rectal bleeding. Sometimes a little nauseated depending on food he eats. No vomiting. NO dysphagia. Diagnosed with prostate cancer this year but doing well. No other GI concerns.   Past Medical History:  Diagnosis Date  . Alcohol abuse, in remission    since 2012  . Anticoagulated on Coumadin    managed by dr Gerarda Fraction  . Avascular necrosis of bone of hip, left ([place])   . CAD (coronary artery disease) cardiologist-  dr Natividad Brood in epic 03-09-2017, pt did want nuclear stress test ;  currently being followed by pcp   a. s/p CABG in 1999.  [name] Cerebral atrophy ([place])   . Chronic hip pain   . Chronic pain syndrome   . Dementia with behavioral disturbance [facility])    neurologist--  dr Charise Carwin [facility] in [place])--- per wife pt can anger quickly by is not aggressive  . Diverticulosis of colon   . History of gout yrs ago  . History of Helicobacter pylori infection    01/ 2019 and treated by dr Gerarda Fraction  . 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 Hyperlipidemia   . Hypertension   . Impairment of short-term and long-term memory    due to dementia  . OA (osteoarthritis)   . Prostate cancer [facility])  urologist-  dr Alyson Ingles  oncologist-- dr Tammi Klippel   dx 06-04-2018-- Stage T1c, Gleason 4+3  . S/P CABG x 4 1999  . Schatzki's ring    s/p dilatation  . Wears glasses     Past Surgical History:  Procedure Laterality Date  . BIOPSY  02/10/2017   Procedure: BIOPSY;  Surgeon: Danie Binder, MD;  Location: [facility];  Service: Endoscopy;;  gastric biopsy   . COLONOSCOPY WITH PROPOFOL N/A 02/10/2017   Colonoscopy with normal TI, diverticulosis in rectosigmoid colon, sigmoid, descending, and ascending colon. Rectal bleeding likely due to inflamed hemorrhoids.   . CORONARY ARTERY BYPASS GRAFT  06/29/1997   @[facility]    4 vessel. no stents per patient  . ESOPHAGOGASTRODUODENOSCOPY (EGD) WITH PROPOFOL N/A 02/10/2017   widely patent Schatzki's ring s/p dilation, H.pylori gastritis.  [name] HEMORRHOID SURGERY N/A 02/11/2017   Procedure: HEMORRHOIDECTOMY;  Surgeon: Aviva Signs, MD;  Location: [facility];  Service: General;  Laterality: N/A;  . RADIOACTIVE SEED IMPLANT N/A 09/13/2018   Procedure: RADIOACTIVE SEED IMPLANT/BRACHYTHERAPY IMPLANT;  Surgeon: Cleon Gustin, MD;  Location: [facility];  Service: Urology;  Laterality: N/A;  . SPACE OAR INSTILLATION N/A 09/13/2018   Procedure: SPACE OAR INSTILLATION;  Surgeon: Cleon Gustin, MD;  Location: [facility];  Service: Urology;  Laterality: N/A;    Current Outpatient Medications  Medication Sig Dispense Refill  .  albuterol (VENTOLIN HFA) 108 (90 Base) MCG/ACT inhaler Inhale 1-2 puffs into the lungs every 4 (four) hours as needed.    [name] alprazolam (XANAX) 2 MG tablet Take 4 mg by mouth at bedtime.     [name] aspirin EC 81 MG tablet Take 81 mg by mouth daily.    [name] atenolol (TENORMIN) 25 MG tablet Take 25 mg by mouth at bedtime.     [name] atorvastatin (LIPITOR) 10 MG tablet Take 10 mg by mouth at bedtime.     [name] buPROPion (WELLBUTRIN) 100 MG tablet Take 0.5 tablets by mouth 2 (two) times daily.     [name] gabapentin (NEURONTIN) 100 MG  capsule Take 100 mg by mouth 3 (three) times daily as needed.    . memantine (NAMENDA) 10 MG tablet Take 10 mg by mouth 2 (two) times daily.    . pantoprazole (PROTONIX) 40 MG tablet Take 40 mg by mouth as needed.    . polyethylene glycol (MIRALAX / GLYCOLAX) packet Take 17 g by mouth daily. 14 each 0  . Psyllium (METAMUCIL PO) Take by mouth. 1 tbsp once a day    . QUEtiapine (SEROQUEL) 200 MG tablet Take 200 mg by mouth at bedtime.    . traMADol (ULTRAM) 50 MG tablet Take 1 tablet (50 mg total) by mouth every 6 (six) hours as needed. 15 tablet 0  . traZODone (DESYREL) 150 MG tablet Take 150 mg by mouth at bedtime.    [name] warfarin (COUMADIN) 4 MG tablet Take 1 tablet by mouth daily at 12 noon.    . traZODone (DESYREL) 100 MG tablet Take 100 mg by mouth at bedtime.     No current facility-administered medications for this visit.    Allergies as of 01/27/2019 - Review Complete 01/27/2019  Allergen Reaction Noted  . Lopressor [metoprolol tartrate] Hives 09/05/2015    Family History  Problem Relation Age of Onset  . Colon polyps Sister        unsure age of surgery, had to have colon resection   . Cancer Maternal Aunt        unknown type  . Cancer Maternal Aunt        unknown  . Colon cancer Neg Hx   . Liver disease Neg Hx     Social History   Socioeconomic History  . Marital status: Married    Spouse name: Not on file  . Number of children: 4  . Years of education: Not on file  . Highest education level: Not on file  Occupational History    Comment: retired  Tobacco Use  . Smoking status: Former Smoker    Packs/day: 0.50    Years: 20.00    Pack years: 10.00    Types: Cigarettes    Quit date: 01/28/2012    Years since quitting: 7.0  . Smokeless tobacco: Never Used  Substance and Sexual Activity  . Alcohol use: Not Currently    Comment: recovering alcoholic, in remission since 2012  . Drug use: Not Currently    Comment: history of pot and LSD in teenage years   . Sexual  activity: Yes  Other Topics Concern  . Not on file  Social History Narrative   Grandson (21) resides with them.   Social Determinants of Health   Financial Resource Strain:   . Difficulty of Paying Living Expenses: Not on file  Food Insecurity:   . Worried About Charity fundraiser in the Last Year: Not on file  . Ran  Out of Food in the Last Year: Not on file  Transportation Needs:   . Lack of Transportation (Medical): Not on file  . Lack of Transportation (Non-Medical): Not on file  Physical Activity:   . Days of Exercise per Week: Not on file  . Minutes of Exercise per Session: Not on file  Stress:   . Feeling of Stress : Not on file  Social Connections:   . Frequency of Communication with Friends and Family: Not on file  . Frequency of Social Gatherings with Friends and Family: Not on file  . Attends Religious Services: Not on file  . Active Member of Clubs or Organizations: Not on file  . Attends Archivist Meetings: Not on file  . Marital Status: Not on file    Review of Systems: Gen: Denies fever, chills, anorexia. Denies fatigue, weakness, weight loss.  CV: Denies chest pain, palpitations, syncope, peripheral edema, and claudication. Resp: Denies dyspnea at rest, cough, wheezing, coughing up blood, and pleurisy. GI: see HPI Derm: Denies rash, itching, dry skin Psych: Denies depression, anxiety, memory loss, confusion. No homicidal or suicidal ideation.  Heme: Denies bruising, bleeding, and enlarged lymph nodes.  Physical Exam: BP 117/70   Pulse 61   Temp (!) 96.6 F (35.9 C) (Temporal)   Ht 5\' 8"  (1.727 m)   Wt 195 lb 12.8 oz (88.8 kg)   BMI 29.77 kg/m  General:   Alert and oriented. No distress noted. Pleasant and cooperative.  Head:  Normocephalic and atraumatic. Abdomen:  +BS, soft, non-tender and non-distended. No rebound or guarding. No HSM or masses noted. Msk:  Symmetrical without gross deformities. Normal posture. Extremities:  Without  edema. Neurologic:  Alert and  oriented x4 Psych:  Alert and cooperative. Normal mood and affect.  ASSESSMENT: Larry Mullins is a 68 y.o. male presenting today with history of GERD, constipation, doing well now from a GI standpoint on PPI daily, Metamucil, and Miralax. Concern for cirrhosis in the past but updated imaging last year with normal liver. No stigmata of portal hypertension on EGD. Following clinically for now.    PLAN:  Continue Metamucil and Miralax Continue PPI daily Will see him again in 1 year   Annitta Needs, PhD, ANP-BC [facility]

## 2019-01-27 NOTE — Patient Instructions (Signed)
We will see you in 1 year!  Continue Protonix daily. Call if any issues with constipation.   Have a wonderful New Year!   I enjoyed seeing you again today! As you know, I value our relationship and want to provide genuine, compassionate, and quality care. I welcome your feedback. If you receive a survey regarding your visit,  I greatly appreciate you taking time to fill this out. See you next time!  Annitta Needs, PhD, ANP-BC [facility]

## 2019-02-04 DIAGNOSIS — G894 Chronic pain syndrome: Secondary | ICD-10-CM | POA: Diagnosis not present

## 2019-03-10 DIAGNOSIS — C61 Malignant neoplasm of prostate: Secondary | ICD-10-CM | POA: Diagnosis not present

## 2019-03-21 ENCOUNTER — Telehealth: Payer: Medicare Other | Admitting: Cardiovascular Disease

## 2019-03-24 DIAGNOSIS — R300 Dysuria: Secondary | ICD-10-CM | POA: Diagnosis not present

## 2019-03-27 DIAGNOSIS — F4542 Pain disorder with related psychological factors: Secondary | ICD-10-CM | POA: Diagnosis not present

## 2019-03-27 DIAGNOSIS — E7849 Other hyperlipidemia: Secondary | ICD-10-CM | POA: Diagnosis not present

## 2019-03-27 DIAGNOSIS — G894 Chronic pain syndrome: Secondary | ICD-10-CM | POA: Diagnosis not present

## 2019-03-27 DIAGNOSIS — I10 Essential (primary) hypertension: Secondary | ICD-10-CM | POA: Diagnosis not present

## 2019-03-30 ENCOUNTER — Telehealth: Payer: Medicare PPO | Admitting: Cardiovascular Disease

## 2019-04-20 ENCOUNTER — Telehealth: Payer: Medicare PPO | Admitting: Cardiovascular Disease

## 2019-05-09 DIAGNOSIS — I10 Essential (primary) hypertension: Secondary | ICD-10-CM | POA: Diagnosis not present

## 2019-05-09 DIAGNOSIS — Z1389 Encounter for screening for other disorder: Secondary | ICD-10-CM | POA: Diagnosis not present

## 2019-05-09 DIAGNOSIS — Z6830 Body mass index (BMI) 30.0-30.9, adult: Secondary | ICD-10-CM | POA: Diagnosis not present

## 2019-05-09 DIAGNOSIS — E7849 Other hyperlipidemia: Secondary | ICD-10-CM | POA: Diagnosis not present

## 2019-05-09 DIAGNOSIS — F1390 Sedative, hypnotic, or anxiolytic use, unspecified, uncomplicated: Secondary | ICD-10-CM | POA: Diagnosis not present

## 2019-05-09 DIAGNOSIS — E538 Deficiency of other specified B group vitamins: Secondary | ICD-10-CM | POA: Diagnosis not present

## 2019-05-09 DIAGNOSIS — M109 Gout, unspecified: Secondary | ICD-10-CM | POA: Diagnosis not present

## 2019-05-09 DIAGNOSIS — E559 Vitamin D deficiency, unspecified: Secondary | ICD-10-CM | POA: Diagnosis not present

## 2019-05-09 DIAGNOSIS — Z0001 Encounter for general adult medical examination with abnormal findings: Secondary | ICD-10-CM | POA: Diagnosis not present

## 2019-05-09 DIAGNOSIS — F1120 Opioid dependence, uncomplicated: Secondary | ICD-10-CM | POA: Diagnosis not present

## 2019-05-09 DIAGNOSIS — G894 Chronic pain syndrome: Secondary | ICD-10-CM | POA: Diagnosis not present

## 2019-05-09 DIAGNOSIS — Z125 Encounter for screening for malignant neoplasm of prostate: Secondary | ICD-10-CM | POA: Diagnosis not present

## 2019-05-09 DIAGNOSIS — E6609 Other obesity due to excess calories: Secondary | ICD-10-CM | POA: Diagnosis not present

## 2019-05-09 DIAGNOSIS — F419 Anxiety disorder, unspecified: Secondary | ICD-10-CM | POA: Diagnosis not present

## 2019-05-28 IMAGING — US US ABDOMEN COMPLETE
1 series · 14 of 25 positions shown · non-contrast
Comparison: CT abdomen and pelvis 09/05/2015

CLINICAL DATA: Cirrhosis, hepatocellular carcinoma screening

EXAM:
ABDOMEN ULTRASOUND COMPLETE

[Series 1: us abdomen complete · 0.18mm/px · 14 of 90 slices shown]
[im 1/90]
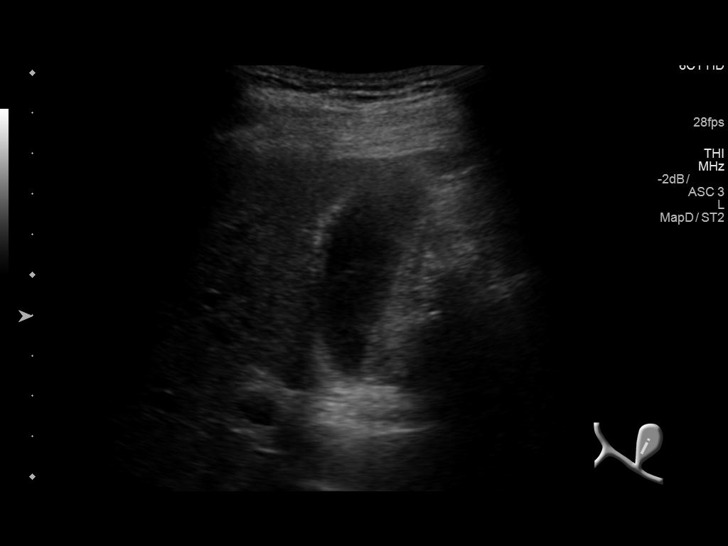
[im 8/90]
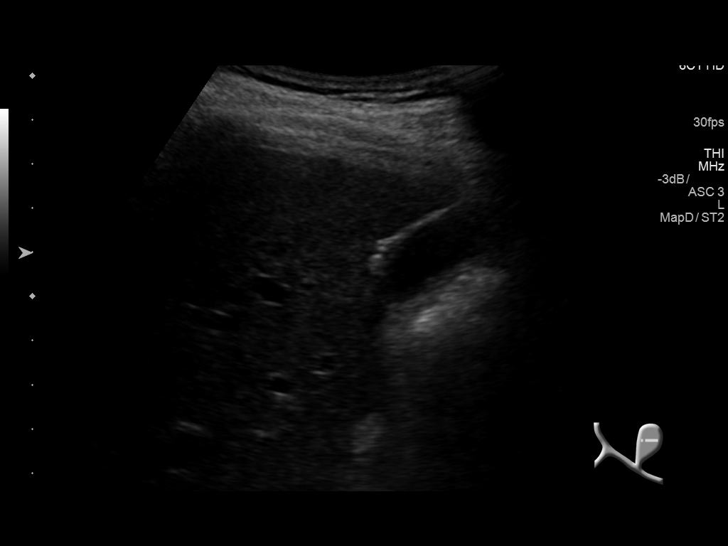
[im 15/90]
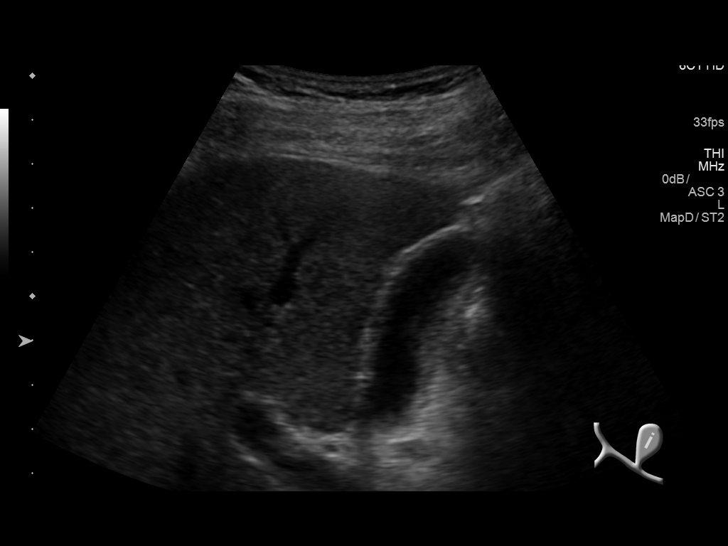
[im 23/90]
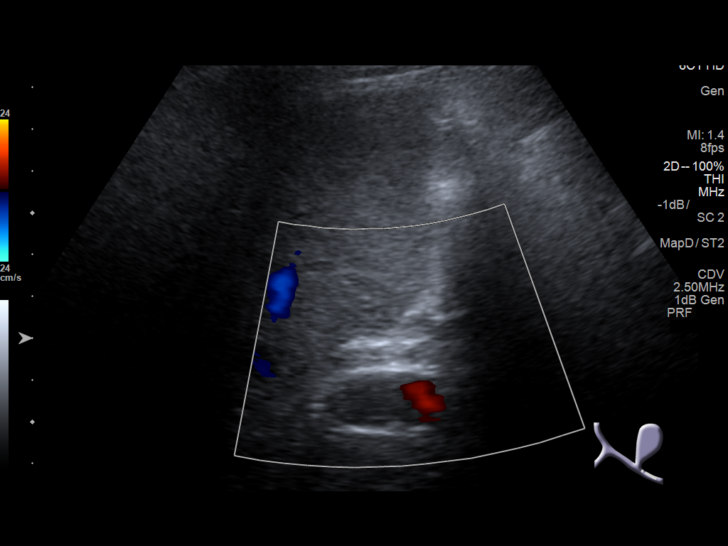
[im 30/90]
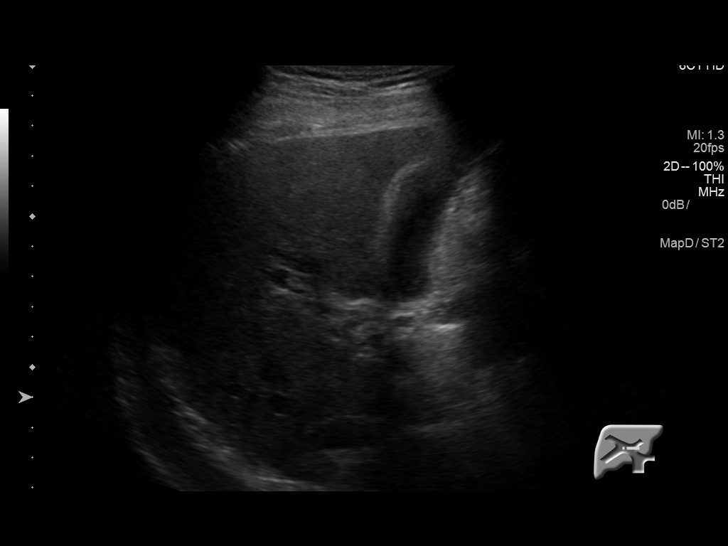
[im 34/90]
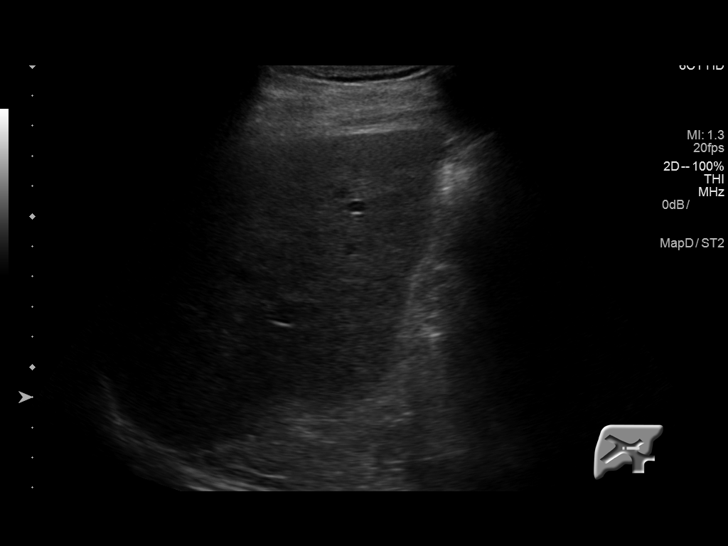
[im 41/90]
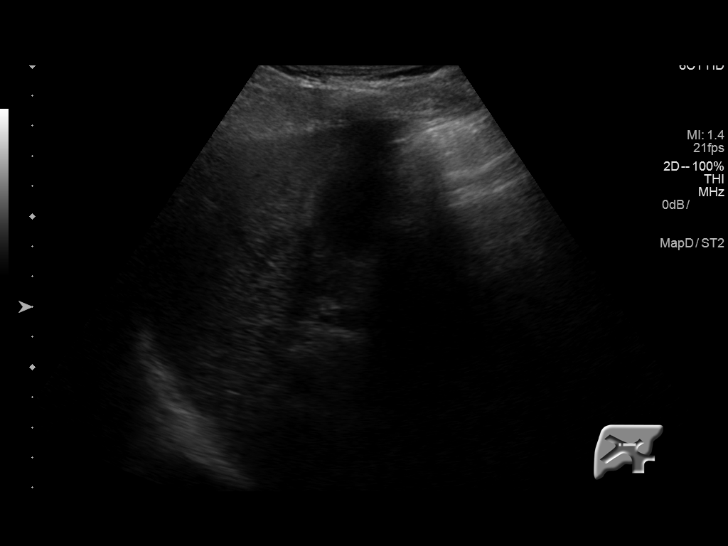
[im 49/90]
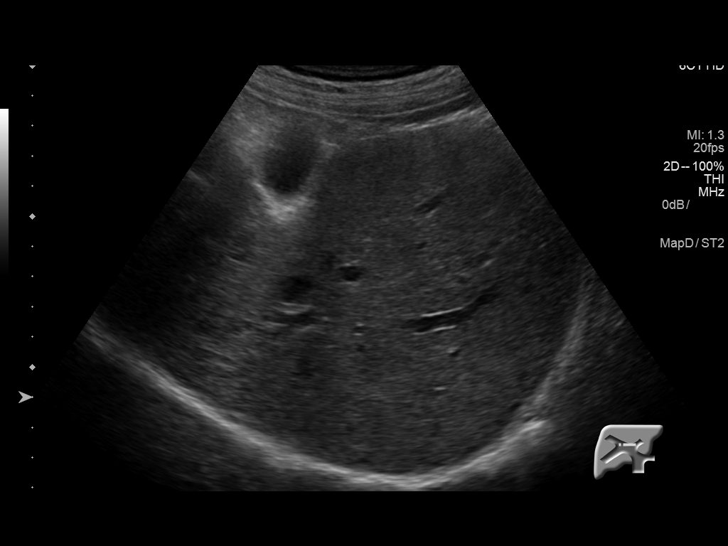
[im 56/90]
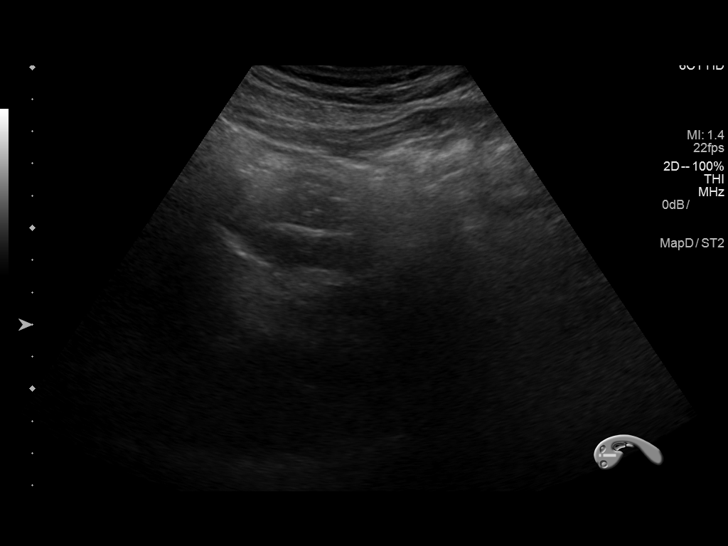
[im 60/90]
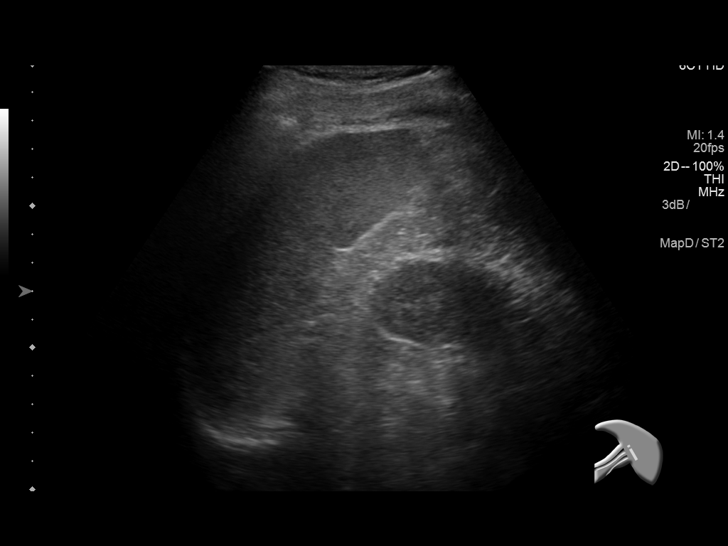
[im 67/90]
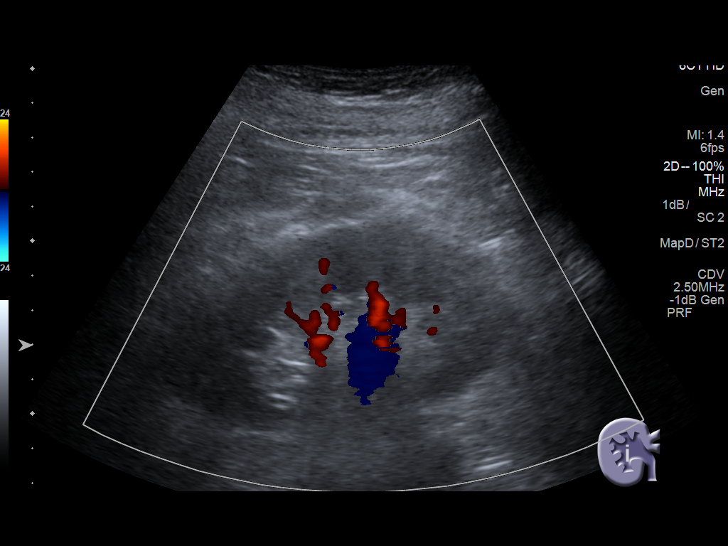
[im 75/90]
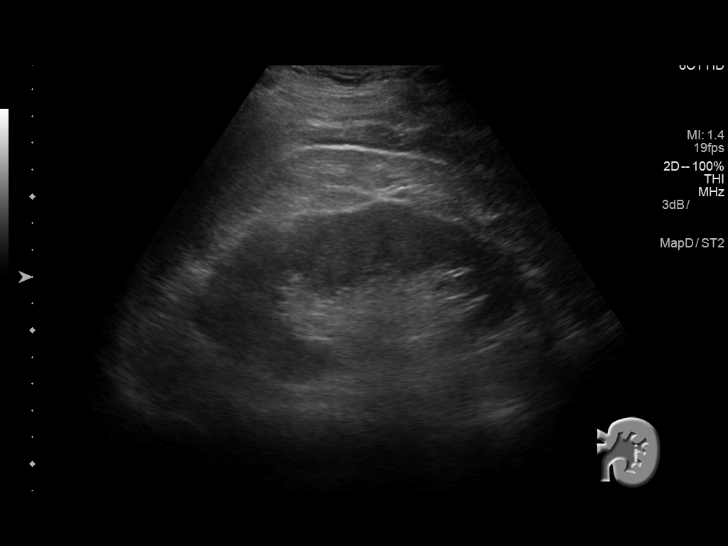
[im 82/90]
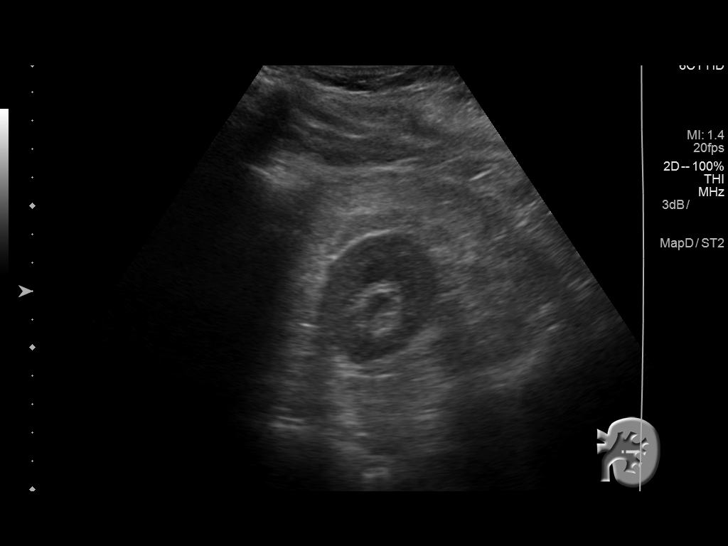
[im 90/90]
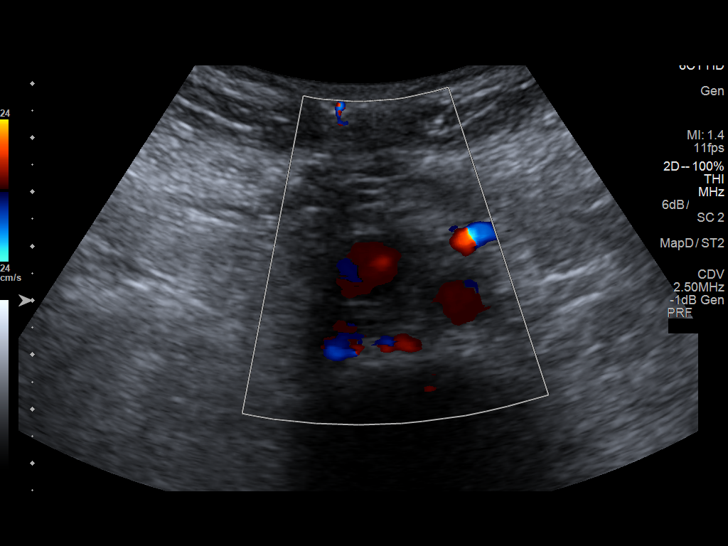

[14 of 25 positions shown; findings below may reference images not displayed]

FINDINGS: Gallbladder: Normally distended without stones or pericholecystic
fluid. Upper normal wall thickness. No sonographic Murphy sign.

Common bile duct: Diameter: 6 mm diameter, normal for age

Liver: Mildly increased hepatic echogenicity. No discrete hepatic
mass or nodularity identified. Portal vein is patent on color
Doppler imaging with normal direction of blood flow towards the
liver.

IVC: Normal appearance

Pancreas: Obscured by bowel gas

Spleen: Normal appearance, 7.9 cm length

Right Kidney: Length: 11.0 cm. Normal morphology without mass or
hydronephrosis.

Left Kidney: Length: 12.9 cm. Normal morphology without mass or
hydronephrosis.

Abdominal aorta: Normal caliber with atherosclerotic changes noted.

Other findings: No free fluid
IMPRESSION: Increased hepatic echogenicity consistent with patient's history of
cirrhosis.

Nonvisualization of pancreas.

Otherwise negative exam.

## 2019-06-27 DIAGNOSIS — E7849 Other hyperlipidemia: Secondary | ICD-10-CM | POA: Diagnosis not present

## 2019-06-27 DIAGNOSIS — I10 Essential (primary) hypertension: Secondary | ICD-10-CM | POA: Diagnosis not present

## 2019-06-27 DIAGNOSIS — F4542 Pain disorder with related psychological factors: Secondary | ICD-10-CM | POA: Diagnosis not present

## 2019-06-27 DIAGNOSIS — G894 Chronic pain syndrome: Secondary | ICD-10-CM | POA: Diagnosis not present

## 2019-07-27 DIAGNOSIS — F4542 Pain disorder with related psychological factors: Secondary | ICD-10-CM | POA: Diagnosis not present

## 2019-07-27 DIAGNOSIS — I10 Essential (primary) hypertension: Secondary | ICD-10-CM | POA: Diagnosis not present

## 2019-07-27 DIAGNOSIS — E7849 Other hyperlipidemia: Secondary | ICD-10-CM | POA: Diagnosis not present

## 2019-07-27 DIAGNOSIS — G894 Chronic pain syndrome: Secondary | ICD-10-CM | POA: Diagnosis not present

## 2019-08-09 DIAGNOSIS — Z6830 Body mass index (BMI) 30.0-30.9, adult: Secondary | ICD-10-CM | POA: Diagnosis not present

## 2019-08-09 DIAGNOSIS — K219 Gastro-esophageal reflux disease without esophagitis: Secondary | ICD-10-CM | POA: Diagnosis not present

## 2019-08-09 DIAGNOSIS — E039 Hypothyroidism, unspecified: Secondary | ICD-10-CM | POA: Diagnosis not present

## 2019-08-09 DIAGNOSIS — E6609 Other obesity due to excess calories: Secondary | ICD-10-CM | POA: Diagnosis not present

## 2019-08-09 DIAGNOSIS — I10 Essential (primary) hypertension: Secondary | ICD-10-CM | POA: Diagnosis not present

## 2019-08-09 DIAGNOSIS — E7849 Other hyperlipidemia: Secondary | ICD-10-CM | POA: Diagnosis not present

## 2019-08-09 DIAGNOSIS — F419 Anxiety disorder, unspecified: Secondary | ICD-10-CM | POA: Diagnosis not present

## 2019-09-27 ENCOUNTER — Other Ambulatory Visit: Payer: Self-pay

## 2019-09-27 DIAGNOSIS — E7849 Other hyperlipidemia: Secondary | ICD-10-CM | POA: Diagnosis not present

## 2019-09-27 DIAGNOSIS — I10 Essential (primary) hypertension: Secondary | ICD-10-CM | POA: Diagnosis not present

## 2019-09-27 DIAGNOSIS — G894 Chronic pain syndrome: Secondary | ICD-10-CM | POA: Diagnosis not present

## 2019-09-27 DIAGNOSIS — F4542 Pain disorder with related psychological factors: Secondary | ICD-10-CM | POA: Diagnosis not present

## 2019-09-28 ENCOUNTER — Other Ambulatory Visit: Payer: Self-pay

## 2019-10-17 DIAGNOSIS — G4700 Insomnia, unspecified: Secondary | ICD-10-CM | POA: Diagnosis not present

## 2019-10-17 DIAGNOSIS — Z79899 Other long term (current) drug therapy: Secondary | ICD-10-CM | POA: Diagnosis not present

## 2019-10-17 DIAGNOSIS — R413 Other amnesia: Secondary | ICD-10-CM | POA: Diagnosis not present

## 2019-10-17 DIAGNOSIS — G309 Alzheimer's disease, unspecified: Secondary | ICD-10-CM | POA: Diagnosis not present

## 2019-10-17 DIAGNOSIS — F419 Anxiety disorder, unspecified: Secondary | ICD-10-CM | POA: Diagnosis not present

## 2019-10-27 DIAGNOSIS — I10 Essential (primary) hypertension: Secondary | ICD-10-CM | POA: Diagnosis not present

## 2019-10-27 DIAGNOSIS — G894 Chronic pain syndrome: Secondary | ICD-10-CM | POA: Diagnosis not present

## 2019-10-27 DIAGNOSIS — F4542 Pain disorder with related psychological factors: Secondary | ICD-10-CM | POA: Diagnosis not present

## 2019-10-27 DIAGNOSIS — E7849 Other hyperlipidemia: Secondary | ICD-10-CM | POA: Diagnosis not present

## 2019-11-09 DIAGNOSIS — F330 Major depressive disorder, recurrent, mild: Secondary | ICD-10-CM | POA: Diagnosis not present

## 2019-11-09 DIAGNOSIS — Z7901 Long term (current) use of anticoagulants: Secondary | ICD-10-CM | POA: Diagnosis not present

## 2019-11-09 DIAGNOSIS — Z6829 Body mass index (BMI) 29.0-29.9, adult: Secondary | ICD-10-CM | POA: Diagnosis not present

## 2019-11-09 DIAGNOSIS — G894 Chronic pain syndrome: Secondary | ICD-10-CM | POA: Diagnosis not present

## 2019-11-09 DIAGNOSIS — Z23 Encounter for immunization: Secondary | ICD-10-CM | POA: Diagnosis not present

## 2019-11-26 DIAGNOSIS — F4542 Pain disorder with related psychological factors: Secondary | ICD-10-CM | POA: Diagnosis not present

## 2019-11-26 DIAGNOSIS — E7849 Other hyperlipidemia: Secondary | ICD-10-CM | POA: Diagnosis not present

## 2019-11-26 DIAGNOSIS — G894 Chronic pain syndrome: Secondary | ICD-10-CM | POA: Diagnosis not present

## 2019-11-26 DIAGNOSIS — I10 Essential (primary) hypertension: Secondary | ICD-10-CM | POA: Diagnosis not present

## 2019-12-27 ENCOUNTER — Encounter: Payer: Self-pay | Admitting: Internal Medicine

## 2019-12-27 DIAGNOSIS — I10 Essential (primary) hypertension: Secondary | ICD-10-CM | POA: Diagnosis not present

## 2019-12-27 DIAGNOSIS — F4542 Pain disorder with related psychological factors: Secondary | ICD-10-CM | POA: Diagnosis not present

## 2019-12-27 DIAGNOSIS — G894 Chronic pain syndrome: Secondary | ICD-10-CM | POA: Diagnosis not present

## 2019-12-27 DIAGNOSIS — E7849 Other hyperlipidemia: Secondary | ICD-10-CM | POA: Diagnosis not present

## 2020-01-27 DIAGNOSIS — F4542 Pain disorder with related psychological factors: Secondary | ICD-10-CM | POA: Diagnosis not present

## 2020-01-27 DIAGNOSIS — I10 Essential (primary) hypertension: Secondary | ICD-10-CM | POA: Diagnosis not present

## 2020-01-27 DIAGNOSIS — E7849 Other hyperlipidemia: Secondary | ICD-10-CM | POA: Diagnosis not present

## 2020-01-27 DIAGNOSIS — G894 Chronic pain syndrome: Secondary | ICD-10-CM | POA: Diagnosis not present

## 2020-02-01 DIAGNOSIS — E7849 Other hyperlipidemia: Secondary | ICD-10-CM | POA: Diagnosis not present

## 2020-02-01 DIAGNOSIS — K219 Gastro-esophageal reflux disease without esophagitis: Secondary | ICD-10-CM | POA: Diagnosis not present

## 2020-02-01 DIAGNOSIS — Z6829 Body mass index (BMI) 29.0-29.9, adult: Secondary | ICD-10-CM | POA: Diagnosis not present

## 2020-02-01 DIAGNOSIS — G894 Chronic pain syndrome: Secondary | ICD-10-CM | POA: Diagnosis not present

## 2020-03-26 DIAGNOSIS — G894 Chronic pain syndrome: Secondary | ICD-10-CM | POA: Diagnosis not present

## 2020-03-26 DIAGNOSIS — I10 Essential (primary) hypertension: Secondary | ICD-10-CM | POA: Diagnosis not present

## 2020-03-26 DIAGNOSIS — E7849 Other hyperlipidemia: Secondary | ICD-10-CM | POA: Diagnosis not present

## 2020-03-26 DIAGNOSIS — F4542 Pain disorder with related psychological factors: Secondary | ICD-10-CM | POA: Diagnosis not present

## 2020-04-25 DIAGNOSIS — I10 Essential (primary) hypertension: Secondary | ICD-10-CM | POA: Diagnosis not present

## 2020-04-25 DIAGNOSIS — E7849 Other hyperlipidemia: Secondary | ICD-10-CM | POA: Diagnosis not present

## 2020-04-25 DIAGNOSIS — F4542 Pain disorder with related psychological factors: Secondary | ICD-10-CM | POA: Diagnosis not present

## 2020-04-25 DIAGNOSIS — G894 Chronic pain syndrome: Secondary | ICD-10-CM | POA: Diagnosis not present

## 2020-04-30 DIAGNOSIS — I10 Essential (primary) hypertension: Secondary | ICD-10-CM | POA: Diagnosis not present

## 2020-04-30 DIAGNOSIS — E6609 Other obesity due to excess calories: Secondary | ICD-10-CM | POA: Diagnosis not present

## 2020-04-30 DIAGNOSIS — G894 Chronic pain syndrome: Secondary | ICD-10-CM | POA: Diagnosis not present

## 2020-04-30 DIAGNOSIS — Z6830 Body mass index (BMI) 30.0-30.9, adult: Secondary | ICD-10-CM | POA: Diagnosis not present

## 2020-04-30 DIAGNOSIS — F419 Anxiety disorder, unspecified: Secondary | ICD-10-CM | POA: Diagnosis not present

## 2020-05-26 DIAGNOSIS — F4542 Pain disorder with related psychological factors: Secondary | ICD-10-CM | POA: Diagnosis not present

## 2020-05-26 DIAGNOSIS — G894 Chronic pain syndrome: Secondary | ICD-10-CM | POA: Diagnosis not present

## 2020-05-26 DIAGNOSIS — I10 Essential (primary) hypertension: Secondary | ICD-10-CM | POA: Diagnosis not present

## 2020-05-26 DIAGNOSIS — E7849 Other hyperlipidemia: Secondary | ICD-10-CM | POA: Diagnosis not present

## 2020-06-26 DIAGNOSIS — I10 Essential (primary) hypertension: Secondary | ICD-10-CM | POA: Diagnosis not present

## 2020-06-26 DIAGNOSIS — E7849 Other hyperlipidemia: Secondary | ICD-10-CM | POA: Diagnosis not present

## 2020-06-26 DIAGNOSIS — F4542 Pain disorder with related psychological factors: Secondary | ICD-10-CM | POA: Diagnosis not present

## 2020-06-26 DIAGNOSIS — G894 Chronic pain syndrome: Secondary | ICD-10-CM | POA: Diagnosis not present

## 2020-08-01 DIAGNOSIS — G894 Chronic pain syndrome: Secondary | ICD-10-CM | POA: Diagnosis not present

## 2020-08-01 DIAGNOSIS — E782 Mixed hyperlipidemia: Secondary | ICD-10-CM | POA: Diagnosis not present

## 2020-08-01 DIAGNOSIS — Z6829 Body mass index (BMI) 29.0-29.9, adult: Secondary | ICD-10-CM | POA: Diagnosis not present

## 2020-08-01 DIAGNOSIS — C61 Malignant neoplasm of prostate: Secondary | ICD-10-CM | POA: Diagnosis not present

## 2020-08-01 DIAGNOSIS — Z1389 Encounter for screening for other disorder: Secondary | ICD-10-CM | POA: Diagnosis not present

## 2020-08-01 DIAGNOSIS — E663 Overweight: Secondary | ICD-10-CM | POA: Diagnosis not present

## 2020-08-01 DIAGNOSIS — F419 Anxiety disorder, unspecified: Secondary | ICD-10-CM | POA: Diagnosis not present

## 2020-08-01 DIAGNOSIS — F1120 Opioid dependence, uncomplicated: Secondary | ICD-10-CM | POA: Diagnosis not present

## 2020-08-01 DIAGNOSIS — Z0001 Encounter for general adult medical examination with abnormal findings: Secondary | ICD-10-CM | POA: Diagnosis not present

## 2020-08-01 DIAGNOSIS — I872 Venous insufficiency (chronic) (peripheral): Secondary | ICD-10-CM | POA: Diagnosis not present

## 2020-08-26 DIAGNOSIS — F4542 Pain disorder with related psychological factors: Secondary | ICD-10-CM | POA: Diagnosis not present

## 2020-08-26 DIAGNOSIS — I10 Essential (primary) hypertension: Secondary | ICD-10-CM | POA: Diagnosis not present

## 2020-08-26 DIAGNOSIS — E782 Mixed hyperlipidemia: Secondary | ICD-10-CM | POA: Diagnosis not present

## 2020-08-26 DIAGNOSIS — G894 Chronic pain syndrome: Secondary | ICD-10-CM | POA: Diagnosis not present

## 2020-09-26 DIAGNOSIS — G894 Chronic pain syndrome: Secondary | ICD-10-CM | POA: Diagnosis not present

## 2020-09-26 DIAGNOSIS — I10 Essential (primary) hypertension: Secondary | ICD-10-CM | POA: Diagnosis not present

## 2020-09-26 DIAGNOSIS — E7849 Other hyperlipidemia: Secondary | ICD-10-CM | POA: Diagnosis not present

## 2020-09-26 DIAGNOSIS — F4542 Pain disorder with related psychological factors: Secondary | ICD-10-CM | POA: Diagnosis not present

## 2020-10-11 DIAGNOSIS — R413 Other amnesia: Secondary | ICD-10-CM | POA: Diagnosis not present

## 2020-10-11 DIAGNOSIS — G4700 Insomnia, unspecified: Secondary | ICD-10-CM | POA: Diagnosis not present

## 2020-10-11 DIAGNOSIS — G309 Alzheimer's disease, unspecified: Secondary | ICD-10-CM | POA: Diagnosis not present

## 2020-10-11 DIAGNOSIS — F419 Anxiety disorder, unspecified: Secondary | ICD-10-CM | POA: Diagnosis not present

## 2020-10-11 DIAGNOSIS — Z79899 Other long term (current) drug therapy: Secondary | ICD-10-CM | POA: Diagnosis not present

## 2020-10-17 ENCOUNTER — Encounter: Payer: Self-pay | Admitting: Internal Medicine

## 2020-10-29 DIAGNOSIS — G4700 Insomnia, unspecified: Secondary | ICD-10-CM | POA: Diagnosis not present

## 2020-10-29 DIAGNOSIS — M1991 Primary osteoarthritis, unspecified site: Secondary | ICD-10-CM | POA: Diagnosis not present

## 2020-10-29 DIAGNOSIS — F419 Anxiety disorder, unspecified: Secondary | ICD-10-CM | POA: Diagnosis not present

## 2020-10-29 DIAGNOSIS — C61 Malignant neoplasm of prostate: Secondary | ICD-10-CM | POA: Diagnosis not present

## 2020-10-29 DIAGNOSIS — G894 Chronic pain syndrome: Secondary | ICD-10-CM | POA: Diagnosis not present

## 2020-11-05 ENCOUNTER — Encounter: Payer: Self-pay | Admitting: Urology

## 2020-11-06 IMAGING — NM NUCLEAR MEDICINE WHOLE BODY BONE SCINTIGRAPHY
2 series · 2 of 2 positions shown · non-contrast
Comparison: Previous radiography and CT studies.

CLINICAL DATA: Back pain radiating through the chest for the last 7
years. Distant history of injury.

EXAM:
NUCLEAR MEDICINE WHOLE BODY BONE SCAN
TECHNIQUE: Whole body anterior and posterior images were obtained approximately
3 hours after intravenous injection of radiopharmaceutical.
RADIOPHARMACEUTICALS:  20.8 mCi Wechnetium-00m MDP IV

[Series 1: whole body · 2.66mm/px · 1 of 1 slices shown (1 of 2)]
[im 1/1]
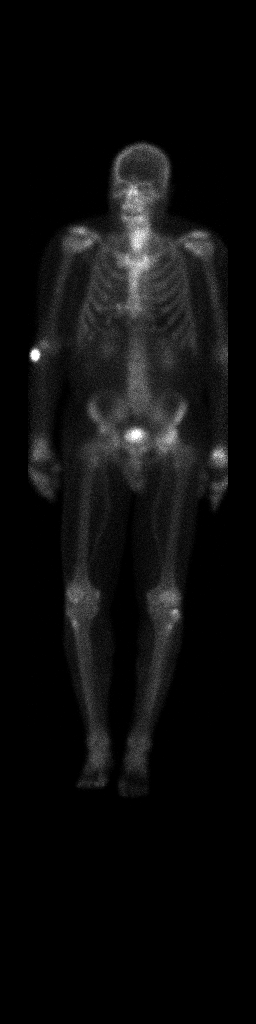

[Series 1: whole body · 2.66mm/px · 1 of 1 slices shown (2 of 2)]
[im 1/1]
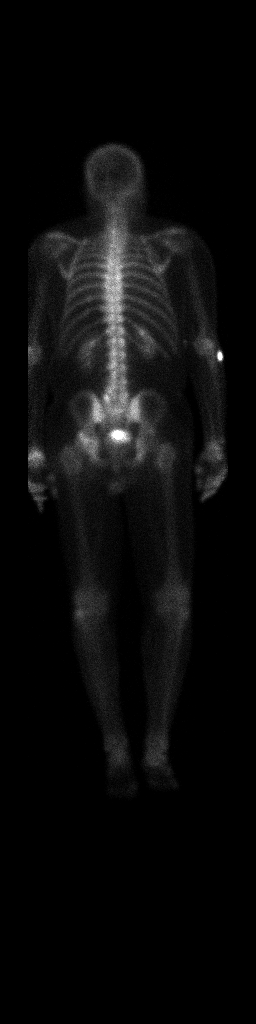

[2 of 2 positions shown; findings below may reference images not displayed]

FINDINGS: No evidence of abnormal spinal activity in the thoracic region. Mild
increased activity of the left L4-5 facet. No abnormal rib activity.
Shoulders and elbows appear negative. There appears to be some
arthritic type activity in the left wrist region. There is arthritic
type activity of the left hip. There are arthritic changes of both
knees.
IMPRESSION: No evidence of thoracic spine region abnormality or significant rib
finding.

Ordinary arthritic-type activity at the left wrist, left hip and
both knees. Probable focal activity at the left L4-5 facet joint.

## 2021-01-08 IMAGING — CR CHEST - 2 VIEW
2 series · 2 of 2 positions shown · non-contrast
Comparison: 02/06/2017

CLINICAL DATA: History of heart surgery, ex smoker

EXAM:
CHEST - 2 VIEW

[w chest pa]
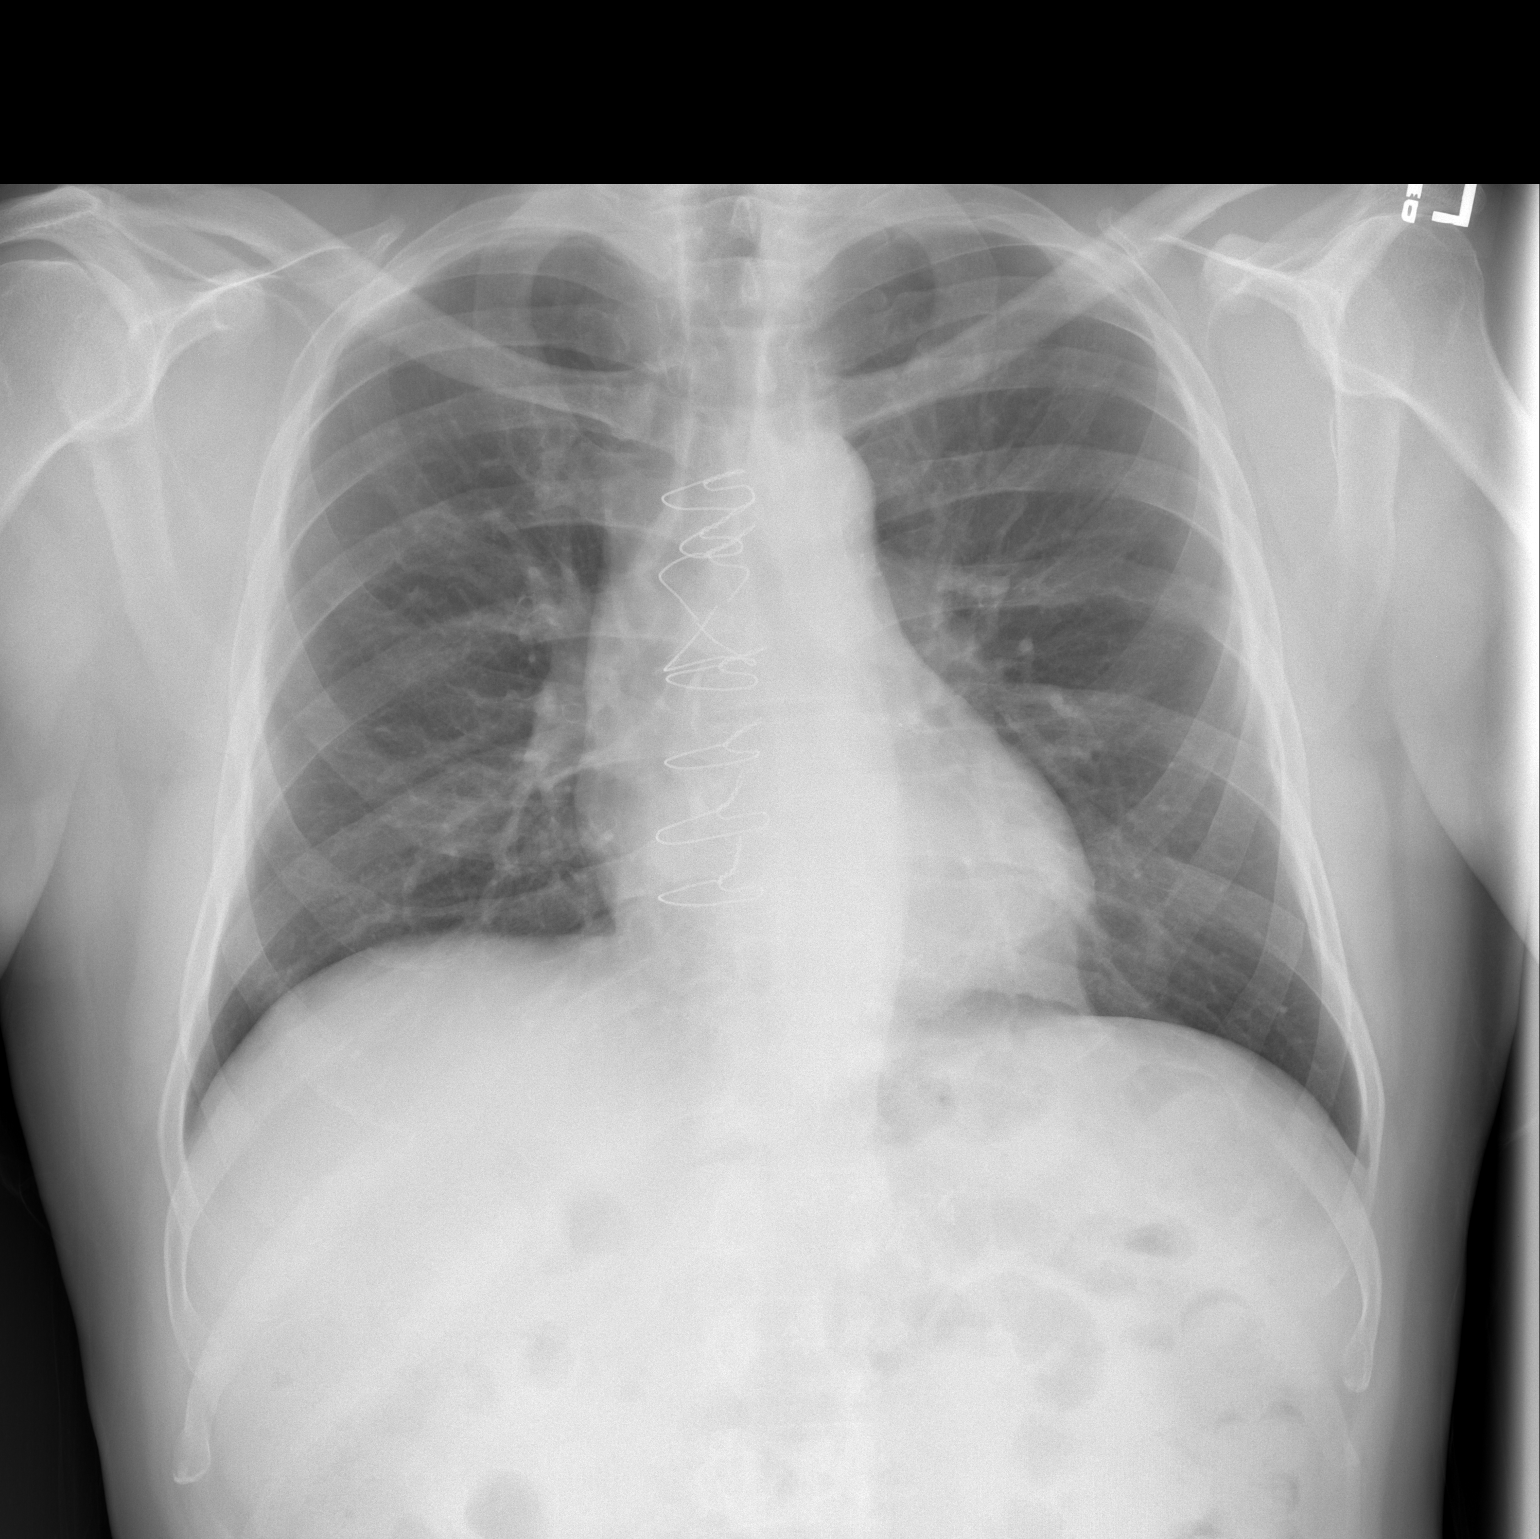

[w chest lat]
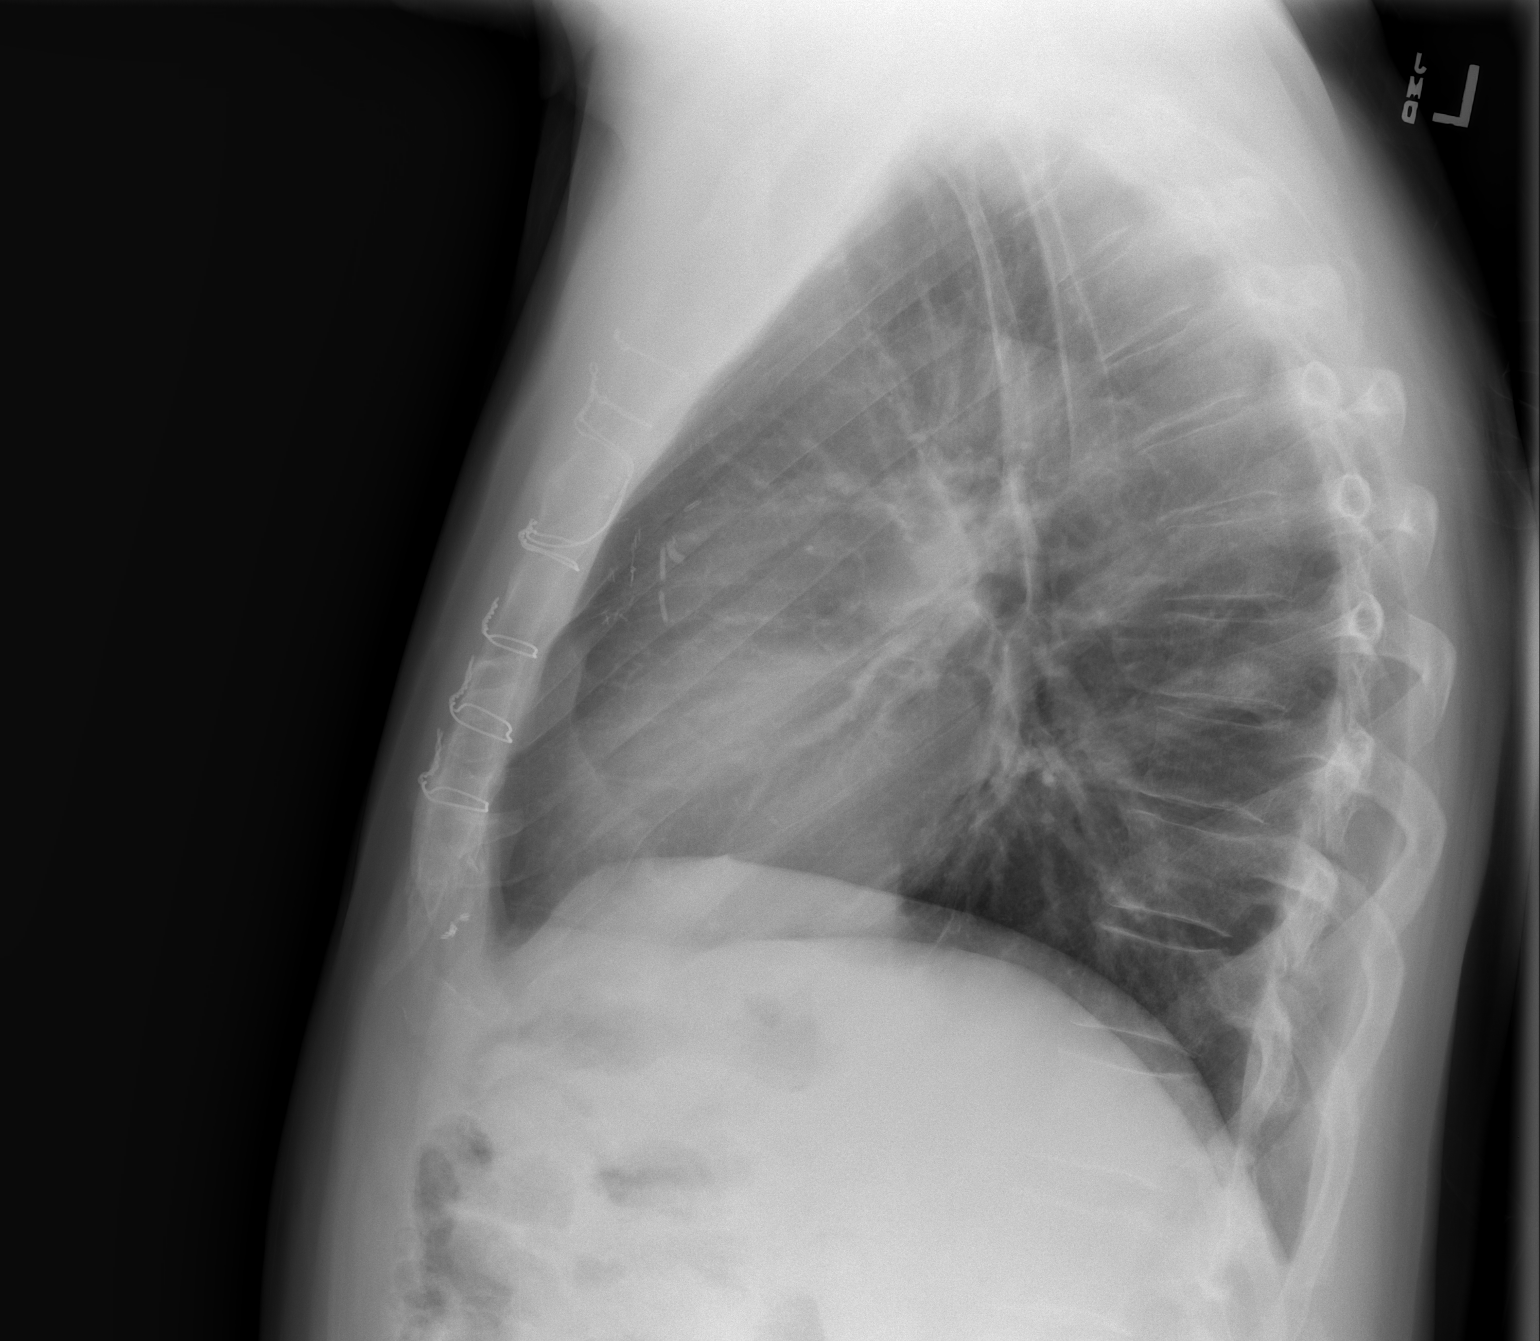

[2 of 2 positions shown; findings below may reference images not displayed]

FINDINGS: Status post median sternotomy. Both lungs are clear. The visualized
skeletal structures are unremarkable.
IMPRESSION: No acute abnormality of the lungs.

## 2021-01-29 DIAGNOSIS — G894 Chronic pain syndrome: Secondary | ICD-10-CM | POA: Diagnosis not present

## 2021-01-29 DIAGNOSIS — E669 Obesity, unspecified: Secondary | ICD-10-CM | POA: Diagnosis not present

## 2021-01-29 DIAGNOSIS — E039 Hypothyroidism, unspecified: Secondary | ICD-10-CM | POA: Diagnosis not present

## 2021-01-29 DIAGNOSIS — Z6829 Body mass index (BMI) 29.0-29.9, adult: Secondary | ICD-10-CM | POA: Diagnosis not present

## 2021-02-12 ENCOUNTER — Encounter: Payer: Self-pay | Admitting: Internal Medicine

## 2021-02-28 ENCOUNTER — Ambulatory Visit: Payer: Medicare PPO | Admitting: Gastroenterology

## 2021-04-29 DIAGNOSIS — M1991 Primary osteoarthritis, unspecified site: Secondary | ICD-10-CM | POA: Diagnosis not present

## 2021-04-29 DIAGNOSIS — G894 Chronic pain syndrome: Secondary | ICD-10-CM | POA: Diagnosis not present

## 2021-04-29 DIAGNOSIS — F419 Anxiety disorder, unspecified: Secondary | ICD-10-CM | POA: Diagnosis not present

## 2021-04-29 DIAGNOSIS — E039 Hypothyroidism, unspecified: Secondary | ICD-10-CM | POA: Diagnosis not present

## 2021-04-29 DIAGNOSIS — I10 Essential (primary) hypertension: Secondary | ICD-10-CM | POA: Diagnosis not present

## 2021-04-29 DIAGNOSIS — Z6829 Body mass index (BMI) 29.0-29.9, adult: Secondary | ICD-10-CM | POA: Diagnosis not present

## 2021-04-29 DIAGNOSIS — E663 Overweight: Secondary | ICD-10-CM | POA: Diagnosis not present

## 2021-06-06 ENCOUNTER — Ambulatory Visit: Payer: Medicare PPO | Admitting: Gastroenterology

## 2021-07-22 DIAGNOSIS — E663 Overweight: Secondary | ICD-10-CM | POA: Diagnosis not present

## 2021-07-22 DIAGNOSIS — M1991 Primary osteoarthritis, unspecified site: Secondary | ICD-10-CM | POA: Diagnosis not present

## 2021-07-22 DIAGNOSIS — F419 Anxiety disorder, unspecified: Secondary | ICD-10-CM | POA: Diagnosis not present

## 2021-07-22 DIAGNOSIS — I10 Essential (primary) hypertension: Secondary | ICD-10-CM | POA: Diagnosis not present

## 2021-07-22 DIAGNOSIS — F330 Major depressive disorder, recurrent, mild: Secondary | ICD-10-CM | POA: Diagnosis not present

## 2021-07-22 DIAGNOSIS — F1120 Opioid dependence, uncomplicated: Secondary | ICD-10-CM | POA: Diagnosis not present

## 2021-07-22 DIAGNOSIS — Z6827 Body mass index (BMI) 27.0-27.9, adult: Secondary | ICD-10-CM | POA: Diagnosis not present

## 2021-07-22 DIAGNOSIS — E039 Hypothyroidism, unspecified: Secondary | ICD-10-CM | POA: Diagnosis not present

## 2021-10-21 DIAGNOSIS — E663 Overweight: Secondary | ICD-10-CM | POA: Diagnosis not present

## 2021-10-21 DIAGNOSIS — I10 Essential (primary) hypertension: Secondary | ICD-10-CM | POA: Diagnosis not present

## 2021-10-21 DIAGNOSIS — C61 Malignant neoplasm of prostate: Secondary | ICD-10-CM | POA: Diagnosis not present

## 2021-10-21 DIAGNOSIS — Z1331 Encounter for screening for depression: Secondary | ICD-10-CM | POA: Diagnosis not present

## 2021-10-21 DIAGNOSIS — Z0001 Encounter for general adult medical examination with abnormal findings: Secondary | ICD-10-CM | POA: Diagnosis not present

## 2021-10-21 DIAGNOSIS — Z6827 Body mass index (BMI) 27.0-27.9, adult: Secondary | ICD-10-CM | POA: Diagnosis not present

## 2021-10-21 DIAGNOSIS — F329 Major depressive disorder, single episode, unspecified: Secondary | ICD-10-CM | POA: Diagnosis not present

## 2021-10-21 DIAGNOSIS — F1021 Alcohol dependence, in remission: Secondary | ICD-10-CM | POA: Diagnosis not present

## 2021-10-21 DIAGNOSIS — M810 Age-related osteoporosis without current pathological fracture: Secondary | ICD-10-CM | POA: Diagnosis not present

## 2022-01-17 DIAGNOSIS — M1991 Primary osteoarthritis, unspecified site: Secondary | ICD-10-CM | POA: Diagnosis not present

## 2022-01-17 DIAGNOSIS — Z6827 Body mass index (BMI) 27.0-27.9, adult: Secondary | ICD-10-CM | POA: Diagnosis not present

## 2022-01-17 DIAGNOSIS — F0390 Unspecified dementia without behavioral disturbance: Secondary | ICD-10-CM | POA: Diagnosis not present

## 2022-01-17 DIAGNOSIS — F1021 Alcohol dependence, in remission: Secondary | ICD-10-CM | POA: Diagnosis not present

## 2022-01-17 DIAGNOSIS — F419 Anxiety disorder, unspecified: Secondary | ICD-10-CM | POA: Diagnosis not present

## 2022-01-17 DIAGNOSIS — E663 Overweight: Secondary | ICD-10-CM | POA: Diagnosis not present

## 2022-01-17 DIAGNOSIS — I10 Essential (primary) hypertension: Secondary | ICD-10-CM | POA: Diagnosis not present

## 2022-04-18 DIAGNOSIS — E663 Overweight: Secondary | ICD-10-CM | POA: Diagnosis not present

## 2022-04-18 DIAGNOSIS — Z6828 Body mass index (BMI) 28.0-28.9, adult: Secondary | ICD-10-CM | POA: Diagnosis not present

## 2022-04-18 DIAGNOSIS — M810 Age-related osteoporosis without current pathological fracture: Secondary | ICD-10-CM | POA: Diagnosis not present

## 2022-04-18 DIAGNOSIS — F419 Anxiety disorder, unspecified: Secondary | ICD-10-CM | POA: Diagnosis not present

## 2022-04-18 DIAGNOSIS — I10 Essential (primary) hypertension: Secondary | ICD-10-CM | POA: Diagnosis not present

## 2022-04-18 DIAGNOSIS — M1991 Primary osteoarthritis, unspecified site: Secondary | ICD-10-CM | POA: Diagnosis not present

## 2022-07-21 DIAGNOSIS — F1320 Sedative, hypnotic or anxiolytic dependence, uncomplicated: Secondary | ICD-10-CM | POA: Diagnosis not present

## 2022-07-21 DIAGNOSIS — F0390 Unspecified dementia without behavioral disturbance: Secondary | ICD-10-CM | POA: Diagnosis not present

## 2022-07-21 DIAGNOSIS — Z6828 Body mass index (BMI) 28.0-28.9, adult: Secondary | ICD-10-CM | POA: Diagnosis not present

## 2022-07-21 DIAGNOSIS — I10 Essential (primary) hypertension: Secondary | ICD-10-CM | POA: Diagnosis not present

## 2022-07-21 DIAGNOSIS — F419 Anxiety disorder, unspecified: Secondary | ICD-10-CM | POA: Diagnosis not present

## 2022-07-21 DIAGNOSIS — E663 Overweight: Secondary | ICD-10-CM | POA: Diagnosis not present

## 2022-07-21 DIAGNOSIS — M1991 Primary osteoarthritis, unspecified site: Secondary | ICD-10-CM | POA: Diagnosis not present

## 2022-07-21 DIAGNOSIS — F330 Major depressive disorder, recurrent, mild: Secondary | ICD-10-CM | POA: Diagnosis not present

## 2022-09-03 DIAGNOSIS — F419 Anxiety disorder, unspecified: Secondary | ICD-10-CM | POA: Diagnosis not present

## 2022-09-03 DIAGNOSIS — E663 Overweight: Secondary | ICD-10-CM | POA: Diagnosis not present

## 2022-09-03 DIAGNOSIS — F1320 Sedative, hypnotic or anxiolytic dependence, uncomplicated: Secondary | ICD-10-CM | POA: Diagnosis not present

## 2022-09-03 DIAGNOSIS — M1991 Primary osteoarthritis, unspecified site: Secondary | ICD-10-CM | POA: Diagnosis not present

## 2022-09-03 DIAGNOSIS — M810 Age-related osteoporosis without current pathological fracture: Secondary | ICD-10-CM | POA: Diagnosis not present

## 2022-09-03 DIAGNOSIS — F1021 Alcohol dependence, in remission: Secondary | ICD-10-CM | POA: Diagnosis not present

## 2022-09-03 DIAGNOSIS — I10 Essential (primary) hypertension: Secondary | ICD-10-CM | POA: Diagnosis not present

## 2022-09-03 DIAGNOSIS — R310 Gross hematuria: Secondary | ICD-10-CM | POA: Diagnosis not present

## 2022-09-03 DIAGNOSIS — N390 Urinary tract infection, site not specified: Secondary | ICD-10-CM | POA: Diagnosis not present

## 2022-09-09 DIAGNOSIS — Z452 Encounter for adjustment and management of vascular access device: Secondary | ICD-10-CM | POA: Diagnosis not present

## 2022-09-09 DIAGNOSIS — K55059 Acute (reversible) ischemia of intestine, part and extent unspecified: Secondary | ICD-10-CM | POA: Diagnosis not present

## 2022-09-09 DIAGNOSIS — N3289 Other specified disorders of bladder: Secondary | ICD-10-CM | POA: Diagnosis not present

## 2022-09-09 DIAGNOSIS — R578 Other shock: Secondary | ICD-10-CM | POA: Diagnosis not present

## 2022-09-09 DIAGNOSIS — I77811 Abdominal aortic ectasia: Secondary | ICD-10-CM | POA: Diagnosis not present

## 2022-09-09 DIAGNOSIS — F0394 Unspecified dementia, unspecified severity, with anxiety: Secondary | ICD-10-CM | POA: Diagnosis not present

## 2022-09-09 DIAGNOSIS — I2510 Atherosclerotic heart disease of native coronary artery without angina pectoris: Secondary | ICD-10-CM | POA: Diagnosis not present

## 2022-09-09 DIAGNOSIS — Z978 Presence of other specified devices: Secondary | ICD-10-CM | POA: Diagnosis not present

## 2022-09-09 DIAGNOSIS — R579 Shock, unspecified: Secondary | ICD-10-CM | POA: Diagnosis not present

## 2022-09-09 DIAGNOSIS — R935 Abnormal findings on diagnostic imaging of other abdominal regions, including retroperitoneum: Secondary | ICD-10-CM | POA: Diagnosis not present

## 2022-09-09 DIAGNOSIS — Z951 Presence of aortocoronary bypass graft: Secondary | ICD-10-CM | POA: Diagnosis not present

## 2022-09-09 DIAGNOSIS — E871 Hypo-osmolality and hyponatremia: Secondary | ICD-10-CM | POA: Diagnosis not present

## 2022-09-09 DIAGNOSIS — K5790 Diverticulosis of intestine, part unspecified, without perforation or abscess without bleeding: Secondary | ICD-10-CM | POA: Diagnosis not present

## 2022-09-09 DIAGNOSIS — Z992 Dependence on renal dialysis: Secondary | ICD-10-CM | POA: Diagnosis not present

## 2022-09-09 DIAGNOSIS — N179 Acute kidney failure, unspecified: Secondary | ICD-10-CM | POA: Diagnosis not present

## 2022-09-09 DIAGNOSIS — N1339 Other hydronephrosis: Secondary | ICD-10-CM | POA: Diagnosis not present

## 2022-09-09 DIAGNOSIS — N170 Acute kidney failure with tubular necrosis: Secondary | ICD-10-CM | POA: Diagnosis not present

## 2022-09-09 DIAGNOSIS — J90 Pleural effusion, not elsewhere classified: Secondary | ICD-10-CM | POA: Diagnosis not present

## 2022-09-09 DIAGNOSIS — N1330 Unspecified hydronephrosis: Secondary | ICD-10-CM | POA: Diagnosis not present

## 2022-09-09 DIAGNOSIS — E875 Hyperkalemia: Secondary | ICD-10-CM | POA: Diagnosis not present

## 2022-09-09 DIAGNOSIS — I517 Cardiomegaly: Secondary | ICD-10-CM | POA: Diagnosis not present

## 2022-09-09 DIAGNOSIS — J986 Disorders of diaphragm: Secondary | ICD-10-CM | POA: Diagnosis not present

## 2022-09-09 DIAGNOSIS — A419 Sepsis, unspecified organism: Secondary | ICD-10-CM | POA: Diagnosis not present

## 2022-09-09 DIAGNOSIS — Z4682 Encounter for fitting and adjustment of non-vascular catheter: Secondary | ICD-10-CM | POA: Diagnosis not present

## 2022-09-09 DIAGNOSIS — F0390 Unspecified dementia without behavioral disturbance: Secondary | ICD-10-CM | POA: Diagnosis not present

## 2022-09-09 DIAGNOSIS — I129 Hypertensive chronic kidney disease with stage 1 through stage 4 chronic kidney disease, or unspecified chronic kidney disease: Secondary | ICD-10-CM | POA: Diagnosis not present

## 2022-09-09 DIAGNOSIS — R29702 NIHSS score 2: Secondary | ICD-10-CM | POA: Diagnosis not present

## 2022-09-09 DIAGNOSIS — R791 Abnormal coagulation profile: Secondary | ICD-10-CM | POA: Diagnosis not present

## 2022-09-09 DIAGNOSIS — R918 Other nonspecific abnormal finding of lung field: Secondary | ICD-10-CM | POA: Diagnosis not present

## 2022-09-09 DIAGNOSIS — I252 Old myocardial infarction: Secondary | ICD-10-CM | POA: Diagnosis not present

## 2022-09-09 DIAGNOSIS — Z888 Allergy status to other drugs, medicaments and biological substances status: Secondary | ICD-10-CM | POA: Diagnosis not present

## 2022-09-09 DIAGNOSIS — J984 Other disorders of lung: Secondary | ICD-10-CM | POA: Diagnosis not present

## 2022-09-09 DIAGNOSIS — N189 Chronic kidney disease, unspecified: Secondary | ICD-10-CM | POA: Diagnosis not present

## 2022-09-09 DIAGNOSIS — J869 Pyothorax without fistula: Secondary | ICD-10-CM | POA: Diagnosis not present

## 2022-09-09 DIAGNOSIS — N186 End stage renal disease: Secondary | ICD-10-CM | POA: Diagnosis not present

## 2022-09-09 DIAGNOSIS — Z466 Encounter for fitting and adjustment of urinary device: Secondary | ICD-10-CM | POA: Diagnosis not present

## 2022-09-09 DIAGNOSIS — N200 Calculus of kidney: Secondary | ICD-10-CM | POA: Diagnosis not present

## 2022-09-09 DIAGNOSIS — I120 Hypertensive chronic kidney disease with stage 5 chronic kidney disease or end stage renal disease: Secondary | ICD-10-CM | POA: Diagnosis not present

## 2022-09-09 DIAGNOSIS — D62 Acute posthemorrhagic anemia: Secondary | ICD-10-CM | POA: Diagnosis not present

## 2022-09-09 DIAGNOSIS — Z743 Need for continuous supervision: Secondary | ICD-10-CM | POA: Diagnosis not present

## 2022-09-09 DIAGNOSIS — J9811 Atelectasis: Secondary | ICD-10-CM | POA: Diagnosis not present

## 2022-09-09 DIAGNOSIS — N281 Cyst of kidney, acquired: Secondary | ICD-10-CM | POA: Diagnosis not present

## 2022-09-09 DIAGNOSIS — R5381 Other malaise: Secondary | ICD-10-CM | POA: Diagnosis not present

## 2022-09-09 DIAGNOSIS — Z95828 Presence of other vascular implants and grafts: Secondary | ICD-10-CM | POA: Diagnosis not present

## 2022-09-09 DIAGNOSIS — D500 Iron deficiency anemia secondary to blood loss (chronic): Secondary | ICD-10-CM | POA: Diagnosis not present

## 2022-09-09 DIAGNOSIS — K828 Other specified diseases of gallbladder: Secondary | ICD-10-CM | POA: Diagnosis not present

## 2022-09-09 DIAGNOSIS — R319 Hematuria, unspecified: Secondary | ICD-10-CM | POA: Diagnosis not present

## 2022-09-09 DIAGNOSIS — K922 Gastrointestinal hemorrhage, unspecified: Secondary | ICD-10-CM | POA: Diagnosis not present

## 2022-09-09 DIAGNOSIS — Z4901 Encounter for fitting and adjustment of extracorporeal dialysis catheter: Secondary | ICD-10-CM | POA: Diagnosis not present

## 2022-09-09 DIAGNOSIS — G928 Other toxic encephalopathy: Secondary | ICD-10-CM | POA: Diagnosis not present

## 2022-09-09 DIAGNOSIS — I959 Hypotension, unspecified: Secondary | ICD-10-CM | POA: Diagnosis not present

## 2022-09-09 DIAGNOSIS — R310 Gross hematuria: Secondary | ICD-10-CM | POA: Diagnosis not present

## 2022-09-09 DIAGNOSIS — R338 Other retention of urine: Secondary | ICD-10-CM | POA: Diagnosis not present

## 2022-09-09 DIAGNOSIS — K8020 Calculus of gallbladder without cholecystitis without obstruction: Secondary | ICD-10-CM | POA: Diagnosis not present

## 2022-09-09 DIAGNOSIS — R0989 Other specified symptoms and signs involving the circulatory and respiratory systems: Secondary | ICD-10-CM | POA: Diagnosis not present

## 2022-09-09 DIAGNOSIS — N3041 Irradiation cystitis with hematuria: Secondary | ICD-10-CM | POA: Diagnosis not present

## 2022-09-09 DIAGNOSIS — K819 Cholecystitis, unspecified: Secondary | ICD-10-CM | POA: Diagnosis not present

## 2022-10-03 DIAGNOSIS — F0390 Unspecified dementia without behavioral disturbance: Secondary | ICD-10-CM | POA: Diagnosis not present

## 2022-10-03 DIAGNOSIS — N390 Urinary tract infection, site not specified: Secondary | ICD-10-CM | POA: Diagnosis not present

## 2022-10-03 DIAGNOSIS — E782 Mixed hyperlipidemia: Secondary | ICD-10-CM | POA: Diagnosis not present

## 2022-10-03 DIAGNOSIS — F419 Anxiety disorder, unspecified: Secondary | ICD-10-CM | POA: Diagnosis not present

## 2022-10-03 DIAGNOSIS — I10 Essential (primary) hypertension: Secondary | ICD-10-CM | POA: Diagnosis not present

## 2022-10-03 DIAGNOSIS — R310 Gross hematuria: Secondary | ICD-10-CM | POA: Diagnosis not present

## 2022-10-06 DIAGNOSIS — F0394 Unspecified dementia, unspecified severity, with anxiety: Secondary | ICD-10-CM | POA: Diagnosis not present

## 2022-10-06 DIAGNOSIS — F03918 Unspecified dementia, unspecified severity, with other behavioral disturbance: Secondary | ICD-10-CM | POA: Diagnosis not present

## 2022-10-06 DIAGNOSIS — N179 Acute kidney failure, unspecified: Secondary | ICD-10-CM | POA: Diagnosis not present

## 2022-10-06 DIAGNOSIS — Z87891 Personal history of nicotine dependence: Secondary | ICD-10-CM | POA: Diagnosis not present

## 2022-10-06 DIAGNOSIS — N189 Chronic kidney disease, unspecified: Secondary | ICD-10-CM | POA: Diagnosis not present

## 2022-10-06 DIAGNOSIS — I1310 Hypertensive heart and chronic kidney disease without heart failure, with stage 1 through stage 4 chronic kidney disease, or unspecified chronic kidney disease: Secondary | ICD-10-CM | POA: Diagnosis not present

## 2022-10-10 DIAGNOSIS — F0394 Unspecified dementia, unspecified severity, with anxiety: Secondary | ICD-10-CM | POA: Diagnosis not present

## 2022-10-10 DIAGNOSIS — F03918 Unspecified dementia, unspecified severity, with other behavioral disturbance: Secondary | ICD-10-CM | POA: Diagnosis not present

## 2022-10-10 DIAGNOSIS — N179 Acute kidney failure, unspecified: Secondary | ICD-10-CM | POA: Diagnosis not present

## 2022-10-10 DIAGNOSIS — Z87891 Personal history of nicotine dependence: Secondary | ICD-10-CM | POA: Diagnosis not present

## 2022-10-10 DIAGNOSIS — N189 Chronic kidney disease, unspecified: Secondary | ICD-10-CM | POA: Diagnosis not present

## 2022-10-10 DIAGNOSIS — I1310 Hypertensive heart and chronic kidney disease without heart failure, with stage 1 through stage 4 chronic kidney disease, or unspecified chronic kidney disease: Secondary | ICD-10-CM | POA: Diagnosis not present

## 2022-10-13 DIAGNOSIS — F0394 Unspecified dementia, unspecified severity, with anxiety: Secondary | ICD-10-CM | POA: Diagnosis not present

## 2022-10-13 DIAGNOSIS — I1310 Hypertensive heart and chronic kidney disease without heart failure, with stage 1 through stage 4 chronic kidney disease, or unspecified chronic kidney disease: Secondary | ICD-10-CM | POA: Diagnosis not present

## 2022-10-13 DIAGNOSIS — N189 Chronic kidney disease, unspecified: Secondary | ICD-10-CM | POA: Diagnosis not present

## 2022-10-13 DIAGNOSIS — Z87891 Personal history of nicotine dependence: Secondary | ICD-10-CM | POA: Diagnosis not present

## 2022-10-13 DIAGNOSIS — F03918 Unspecified dementia, unspecified severity, with other behavioral disturbance: Secondary | ICD-10-CM | POA: Diagnosis not present

## 2022-10-13 DIAGNOSIS — N179 Acute kidney failure, unspecified: Secondary | ICD-10-CM | POA: Diagnosis not present

## 2022-10-16 DIAGNOSIS — R310 Gross hematuria: Secondary | ICD-10-CM | POA: Diagnosis not present

## 2022-10-16 DIAGNOSIS — I10 Essential (primary) hypertension: Secondary | ICD-10-CM | POA: Diagnosis not present

## 2022-10-20 DIAGNOSIS — I10 Essential (primary) hypertension: Secondary | ICD-10-CM | POA: Diagnosis not present

## 2022-10-20 DIAGNOSIS — F1320 Sedative, hypnotic or anxiolytic dependence, uncomplicated: Secondary | ICD-10-CM | POA: Diagnosis not present

## 2022-10-20 DIAGNOSIS — T50905A Adverse effect of unspecified drugs, medicaments and biological substances, initial encounter: Secondary | ICD-10-CM | POA: Diagnosis not present

## 2022-10-20 DIAGNOSIS — Z6824 Body mass index (BMI) 24.0-24.9, adult: Secondary | ICD-10-CM | POA: Diagnosis not present

## 2022-10-20 DIAGNOSIS — N390 Urinary tract infection, site not specified: Secondary | ICD-10-CM | POA: Diagnosis not present

## 2022-10-20 DIAGNOSIS — F5101 Primary insomnia: Secondary | ICD-10-CM | POA: Diagnosis not present

## 2022-10-20 DIAGNOSIS — F419 Anxiety disorder, unspecified: Secondary | ICD-10-CM | POA: Diagnosis not present

## 2022-10-22 DIAGNOSIS — Z87891 Personal history of nicotine dependence: Secondary | ICD-10-CM | POA: Diagnosis not present

## 2022-10-22 DIAGNOSIS — I1310 Hypertensive heart and chronic kidney disease without heart failure, with stage 1 through stage 4 chronic kidney disease, or unspecified chronic kidney disease: Secondary | ICD-10-CM | POA: Diagnosis not present

## 2022-10-22 DIAGNOSIS — N179 Acute kidney failure, unspecified: Secondary | ICD-10-CM | POA: Diagnosis not present

## 2022-10-22 DIAGNOSIS — N189 Chronic kidney disease, unspecified: Secondary | ICD-10-CM | POA: Diagnosis not present

## 2022-10-22 DIAGNOSIS — F03918 Unspecified dementia, unspecified severity, with other behavioral disturbance: Secondary | ICD-10-CM | POA: Diagnosis not present

## 2022-10-22 DIAGNOSIS — F0394 Unspecified dementia, unspecified severity, with anxiety: Secondary | ICD-10-CM | POA: Diagnosis not present

## 2022-10-27 DIAGNOSIS — F03918 Unspecified dementia, unspecified severity, with other behavioral disturbance: Secondary | ICD-10-CM | POA: Diagnosis not present

## 2022-10-27 DIAGNOSIS — F0394 Unspecified dementia, unspecified severity, with anxiety: Secondary | ICD-10-CM | POA: Diagnosis not present

## 2022-10-27 DIAGNOSIS — Z87891 Personal history of nicotine dependence: Secondary | ICD-10-CM | POA: Diagnosis not present

## 2022-10-27 DIAGNOSIS — I1310 Hypertensive heart and chronic kidney disease without heart failure, with stage 1 through stage 4 chronic kidney disease, or unspecified chronic kidney disease: Secondary | ICD-10-CM | POA: Diagnosis not present

## 2022-10-27 DIAGNOSIS — N189 Chronic kidney disease, unspecified: Secondary | ICD-10-CM | POA: Diagnosis not present

## 2022-10-27 DIAGNOSIS — N179 Acute kidney failure, unspecified: Secondary | ICD-10-CM | POA: Diagnosis not present

## 2022-10-31 DIAGNOSIS — N189 Chronic kidney disease, unspecified: Secondary | ICD-10-CM | POA: Diagnosis not present

## 2022-10-31 DIAGNOSIS — I1310 Hypertensive heart and chronic kidney disease without heart failure, with stage 1 through stage 4 chronic kidney disease, or unspecified chronic kidney disease: Secondary | ICD-10-CM | POA: Diagnosis not present

## 2022-10-31 DIAGNOSIS — Z87891 Personal history of nicotine dependence: Secondary | ICD-10-CM | POA: Diagnosis not present

## 2022-10-31 DIAGNOSIS — N179 Acute kidney failure, unspecified: Secondary | ICD-10-CM | POA: Diagnosis not present

## 2022-11-03 DIAGNOSIS — F0394 Unspecified dementia, unspecified severity, with anxiety: Secondary | ICD-10-CM | POA: Diagnosis not present

## 2022-11-03 DIAGNOSIS — I1310 Hypertensive heart and chronic kidney disease without heart failure, with stage 1 through stage 4 chronic kidney disease, or unspecified chronic kidney disease: Secondary | ICD-10-CM | POA: Diagnosis not present

## 2022-11-03 DIAGNOSIS — Z87891 Personal history of nicotine dependence: Secondary | ICD-10-CM | POA: Diagnosis not present

## 2022-11-03 DIAGNOSIS — N179 Acute kidney failure, unspecified: Secondary | ICD-10-CM | POA: Diagnosis not present

## 2022-11-03 DIAGNOSIS — F03918 Unspecified dementia, unspecified severity, with other behavioral disturbance: Secondary | ICD-10-CM | POA: Diagnosis not present

## 2022-11-03 DIAGNOSIS — N189 Chronic kidney disease, unspecified: Secondary | ICD-10-CM | POA: Diagnosis not present

## 2022-11-10 DIAGNOSIS — F0394 Unspecified dementia, unspecified severity, with anxiety: Secondary | ICD-10-CM | POA: Diagnosis not present

## 2022-11-10 DIAGNOSIS — Z87891 Personal history of nicotine dependence: Secondary | ICD-10-CM | POA: Diagnosis not present

## 2022-11-10 DIAGNOSIS — F03918 Unspecified dementia, unspecified severity, with other behavioral disturbance: Secondary | ICD-10-CM | POA: Diagnosis not present

## 2022-11-10 DIAGNOSIS — N179 Acute kidney failure, unspecified: Secondary | ICD-10-CM | POA: Diagnosis not present

## 2022-11-10 DIAGNOSIS — N189 Chronic kidney disease, unspecified: Secondary | ICD-10-CM | POA: Diagnosis not present

## 2022-11-10 DIAGNOSIS — I1310 Hypertensive heart and chronic kidney disease without heart failure, with stage 1 through stage 4 chronic kidney disease, or unspecified chronic kidney disease: Secondary | ICD-10-CM | POA: Diagnosis not present

## 2022-11-17 DIAGNOSIS — F03918 Unspecified dementia, unspecified severity, with other behavioral disturbance: Secondary | ICD-10-CM | POA: Diagnosis not present

## 2022-11-17 DIAGNOSIS — I1310 Hypertensive heart and chronic kidney disease without heart failure, with stage 1 through stage 4 chronic kidney disease, or unspecified chronic kidney disease: Secondary | ICD-10-CM | POA: Diagnosis not present

## 2022-11-17 DIAGNOSIS — Z87891 Personal history of nicotine dependence: Secondary | ICD-10-CM | POA: Diagnosis not present

## 2022-11-17 DIAGNOSIS — N189 Chronic kidney disease, unspecified: Secondary | ICD-10-CM | POA: Diagnosis not present

## 2022-11-17 DIAGNOSIS — F0394 Unspecified dementia, unspecified severity, with anxiety: Secondary | ICD-10-CM | POA: Diagnosis not present

## 2022-11-17 DIAGNOSIS — N179 Acute kidney failure, unspecified: Secondary | ICD-10-CM | POA: Diagnosis not present

## 2022-11-21 DIAGNOSIS — N390 Urinary tract infection, site not specified: Secondary | ICD-10-CM | POA: Diagnosis not present

## 2022-11-24 DIAGNOSIS — Z87891 Personal history of nicotine dependence: Secondary | ICD-10-CM | POA: Diagnosis not present

## 2022-11-24 DIAGNOSIS — F0394 Unspecified dementia, unspecified severity, with anxiety: Secondary | ICD-10-CM | POA: Diagnosis not present

## 2022-11-24 DIAGNOSIS — F03918 Unspecified dementia, unspecified severity, with other behavioral disturbance: Secondary | ICD-10-CM | POA: Diagnosis not present

## 2022-11-24 DIAGNOSIS — N189 Chronic kidney disease, unspecified: Secondary | ICD-10-CM | POA: Diagnosis not present

## 2022-11-24 DIAGNOSIS — N179 Acute kidney failure, unspecified: Secondary | ICD-10-CM | POA: Diagnosis not present

## 2022-11-24 DIAGNOSIS — I1310 Hypertensive heart and chronic kidney disease without heart failure, with stage 1 through stage 4 chronic kidney disease, or unspecified chronic kidney disease: Secondary | ICD-10-CM | POA: Diagnosis not present

## 2022-12-17 DIAGNOSIS — N1330 Unspecified hydronephrosis: Secondary | ICD-10-CM | POA: Diagnosis not present

## 2023-01-16 DIAGNOSIS — I10 Essential (primary) hypertension: Secondary | ICD-10-CM | POA: Diagnosis not present

## 2023-01-16 DIAGNOSIS — N189 Chronic kidney disease, unspecified: Secondary | ICD-10-CM | POA: Diagnosis not present

## 2023-01-16 DIAGNOSIS — I1310 Hypertensive heart and chronic kidney disease without heart failure, with stage 1 through stage 4 chronic kidney disease, or unspecified chronic kidney disease: Secondary | ICD-10-CM | POA: Diagnosis not present

## 2023-01-16 DIAGNOSIS — F1320 Sedative, hypnotic or anxiolytic dependence, uncomplicated: Secondary | ICD-10-CM | POA: Diagnosis not present

## 2023-02-16 DIAGNOSIS — D518 Other vitamin B12 deficiency anemias: Secondary | ICD-10-CM | POA: Diagnosis not present

## 2023-02-16 DIAGNOSIS — E559 Vitamin D deficiency, unspecified: Secondary | ICD-10-CM | POA: Diagnosis not present

## 2023-02-16 DIAGNOSIS — R300 Dysuria: Secondary | ICD-10-CM | POA: Diagnosis not present

## 2023-02-16 DIAGNOSIS — Z0001 Encounter for general adult medical examination with abnormal findings: Secondary | ICD-10-CM | POA: Diagnosis not present

## 2023-02-16 DIAGNOSIS — F419 Anxiety disorder, unspecified: Secondary | ICD-10-CM | POA: Diagnosis not present

## 2023-02-16 DIAGNOSIS — F5101 Primary insomnia: Secondary | ICD-10-CM | POA: Diagnosis not present

## 2023-02-16 DIAGNOSIS — N183 Chronic kidney disease, stage 3 unspecified: Secondary | ICD-10-CM | POA: Diagnosis not present

## 2023-02-16 DIAGNOSIS — F1320 Sedative, hypnotic or anxiolytic dependence, uncomplicated: Secondary | ICD-10-CM | POA: Diagnosis not present

## 2023-02-16 DIAGNOSIS — I1310 Hypertensive heart and chronic kidney disease without heart failure, with stage 1 through stage 4 chronic kidney disease, or unspecified chronic kidney disease: Secondary | ICD-10-CM | POA: Diagnosis not present

## 2023-02-16 DIAGNOSIS — Z125 Encounter for screening for malignant neoplasm of prostate: Secondary | ICD-10-CM | POA: Diagnosis not present

## 2023-02-16 DIAGNOSIS — I10 Essential (primary) hypertension: Secondary | ICD-10-CM | POA: Diagnosis not present

## 2023-02-16 DIAGNOSIS — E039 Hypothyroidism, unspecified: Secondary | ICD-10-CM | POA: Diagnosis not present

## 2023-03-11 DIAGNOSIS — I10 Essential (primary) hypertension: Secondary | ICD-10-CM | POA: Diagnosis not present

## 2023-03-11 DIAGNOSIS — G894 Chronic pain syndrome: Secondary | ICD-10-CM | POA: Diagnosis not present

## 2023-03-11 DIAGNOSIS — I1310 Hypertensive heart and chronic kidney disease without heart failure, with stage 1 through stage 4 chronic kidney disease, or unspecified chronic kidney disease: Secondary | ICD-10-CM | POA: Diagnosis not present

## 2023-03-11 DIAGNOSIS — N189 Chronic kidney disease, unspecified: Secondary | ICD-10-CM | POA: Diagnosis not present

## 2023-05-14 DIAGNOSIS — I1310 Hypertensive heart and chronic kidney disease without heart failure, with stage 1 through stage 4 chronic kidney disease, or unspecified chronic kidney disease: Secondary | ICD-10-CM | POA: Diagnosis not present

## 2023-05-14 DIAGNOSIS — I10 Essential (primary) hypertension: Secondary | ICD-10-CM | POA: Diagnosis not present

## 2023-05-14 DIAGNOSIS — F419 Anxiety disorder, unspecified: Secondary | ICD-10-CM | POA: Diagnosis not present

## 2023-05-14 DIAGNOSIS — Z6821 Body mass index (BMI) 21.0-21.9, adult: Secondary | ICD-10-CM | POA: Diagnosis not present

## 2023-05-14 DIAGNOSIS — F1320 Sedative, hypnotic or anxiolytic dependence, uncomplicated: Secondary | ICD-10-CM | POA: Diagnosis not present

## 2023-05-14 DIAGNOSIS — N183 Chronic kidney disease, stage 3 unspecified: Secondary | ICD-10-CM | POA: Diagnosis not present

## 2023-08-06 DIAGNOSIS — I10 Essential (primary) hypertension: Secondary | ICD-10-CM | POA: Diagnosis not present

## 2023-08-06 DIAGNOSIS — I1310 Hypertensive heart and chronic kidney disease without heart failure, with stage 1 through stage 4 chronic kidney disease, or unspecified chronic kidney disease: Secondary | ICD-10-CM | POA: Diagnosis not present

## 2023-08-06 DIAGNOSIS — N183 Chronic kidney disease, stage 3 unspecified: Secondary | ICD-10-CM | POA: Diagnosis not present

## 2023-08-06 DIAGNOSIS — Z6821 Body mass index (BMI) 21.0-21.9, adult: Secondary | ICD-10-CM | POA: Diagnosis not present

## 2023-08-06 DIAGNOSIS — F419 Anxiety disorder, unspecified: Secondary | ICD-10-CM | POA: Diagnosis not present

## 2023-08-06 DIAGNOSIS — F1320 Sedative, hypnotic or anxiolytic dependence, uncomplicated: Secondary | ICD-10-CM | POA: Diagnosis not present

## 2023-09-16 DIAGNOSIS — Z6823 Body mass index (BMI) 23.0-23.9, adult: Secondary | ICD-10-CM | POA: Diagnosis not present

## 2023-09-16 DIAGNOSIS — F419 Anxiety disorder, unspecified: Secondary | ICD-10-CM | POA: Diagnosis not present

## 2023-09-16 DIAGNOSIS — I10 Essential (primary) hypertension: Secondary | ICD-10-CM | POA: Diagnosis not present

## 2023-09-16 DIAGNOSIS — I1310 Hypertensive heart and chronic kidney disease without heart failure, with stage 1 through stage 4 chronic kidney disease, or unspecified chronic kidney disease: Secondary | ICD-10-CM | POA: Diagnosis not present

## 2023-09-16 DIAGNOSIS — F5101 Primary insomnia: Secondary | ICD-10-CM | POA: Diagnosis not present

## 2023-09-16 DIAGNOSIS — N183 Chronic kidney disease, stage 3 unspecified: Secondary | ICD-10-CM | POA: Diagnosis not present

## 2023-09-16 DIAGNOSIS — F1320 Sedative, hypnotic or anxiolytic dependence, uncomplicated: Secondary | ICD-10-CM | POA: Diagnosis not present

## 2023-10-08 DIAGNOSIS — I10 Essential (primary) hypertension: Secondary | ICD-10-CM | POA: Diagnosis not present

## 2023-10-08 DIAGNOSIS — Z7689 Persons encountering health services in other specified circumstances: Secondary | ICD-10-CM | POA: Diagnosis not present

## 2023-10-08 DIAGNOSIS — F32A Depression, unspecified: Secondary | ICD-10-CM | POA: Diagnosis not present

## 2023-10-08 DIAGNOSIS — E782 Mixed hyperlipidemia: Secondary | ICD-10-CM | POA: Diagnosis not present

## 2023-10-08 DIAGNOSIS — Z8739 Personal history of other diseases of the musculoskeletal system and connective tissue: Secondary | ICD-10-CM | POA: Diagnosis not present

## 2023-10-08 DIAGNOSIS — F03A0 Unspecified dementia, mild, without behavioral disturbance, psychotic disturbance, mood disturbance, and anxiety: Secondary | ICD-10-CM | POA: Diagnosis not present

## 2023-10-08 DIAGNOSIS — F419 Anxiety disorder, unspecified: Secondary | ICD-10-CM | POA: Diagnosis not present

## 2023-10-08 DIAGNOSIS — E039 Hypothyroidism, unspecified: Secondary | ICD-10-CM | POA: Diagnosis not present

## 2023-10-29 DIAGNOSIS — I10 Essential (primary) hypertension: Secondary | ICD-10-CM | POA: Diagnosis not present

## 2023-10-29 DIAGNOSIS — R7301 Impaired fasting glucose: Secondary | ICD-10-CM | POA: Diagnosis not present

## 2023-10-29 DIAGNOSIS — Z125 Encounter for screening for malignant neoplasm of prostate: Secondary | ICD-10-CM | POA: Diagnosis not present

## 2023-10-29 DIAGNOSIS — E039 Hypothyroidism, unspecified: Secondary | ICD-10-CM | POA: Diagnosis not present

## 2023-11-05 DIAGNOSIS — I10 Essential (primary) hypertension: Secondary | ICD-10-CM | POA: Diagnosis not present

## 2023-11-05 DIAGNOSIS — E039 Hypothyroidism, unspecified: Secondary | ICD-10-CM | POA: Diagnosis not present

## 2023-11-05 DIAGNOSIS — Z23 Encounter for immunization: Secondary | ICD-10-CM | POA: Diagnosis not present

## 2023-11-05 DIAGNOSIS — Z8739 Personal history of other diseases of the musculoskeletal system and connective tissue: Secondary | ICD-10-CM | POA: Diagnosis not present

## 2023-11-05 DIAGNOSIS — Z87891 Personal history of nicotine dependence: Secondary | ICD-10-CM | POA: Diagnosis not present

## 2023-11-05 DIAGNOSIS — E782 Mixed hyperlipidemia: Secondary | ICD-10-CM | POA: Diagnosis not present

## 2023-11-05 DIAGNOSIS — F03A4 Unspecified dementia, mild, with anxiety: Secondary | ICD-10-CM | POA: Diagnosis not present

## 2023-11-05 DIAGNOSIS — F419 Anxiety disorder, unspecified: Secondary | ICD-10-CM | POA: Diagnosis not present

## 2023-11-05 DIAGNOSIS — F32A Depression, unspecified: Secondary | ICD-10-CM | POA: Diagnosis not present
